# Patient Record
Sex: Female | Born: 1970 | Race: White | Hispanic: No | Marital: Single | Smoking: Former smoker
Health system: Southern US, Community
[De-identification: ages and names within clinical notes are randomized; demographics above are authoritative.]

## PROBLEM LIST (undated history)

## (undated) DIAGNOSIS — K219 Gastro-esophageal reflux disease without esophagitis: Secondary | ICD-10-CM

## (undated) DIAGNOSIS — M719 Bursopathy, unspecified: Secondary | ICD-10-CM

## (undated) DIAGNOSIS — K5900 Constipation, unspecified: Secondary | ICD-10-CM

## (undated) DIAGNOSIS — R51 Headache: Secondary | ICD-10-CM

## (undated) DIAGNOSIS — I10 Essential (primary) hypertension: Secondary | ICD-10-CM

## (undated) DIAGNOSIS — R519 Headache, unspecified: Secondary | ICD-10-CM

## (undated) DIAGNOSIS — J189 Pneumonia, unspecified organism: Secondary | ICD-10-CM

## (undated) DIAGNOSIS — Z87442 Personal history of urinary calculi: Secondary | ICD-10-CM

## (undated) DIAGNOSIS — F32A Depression, unspecified: Secondary | ICD-10-CM

## (undated) DIAGNOSIS — F419 Anxiety disorder, unspecified: Secondary | ICD-10-CM

## (undated) DIAGNOSIS — G4730 Sleep apnea, unspecified: Secondary | ICD-10-CM

## (undated) DIAGNOSIS — E669 Obesity, unspecified: Secondary | ICD-10-CM

## (undated) DIAGNOSIS — R112 Nausea with vomiting, unspecified: Secondary | ICD-10-CM

## (undated) DIAGNOSIS — R7303 Prediabetes: Secondary | ICD-10-CM

## (undated) DIAGNOSIS — N189 Chronic kidney disease, unspecified: Secondary | ICD-10-CM

## (undated) DIAGNOSIS — Z5189 Encounter for other specified aftercare: Secondary | ICD-10-CM

## (undated) DIAGNOSIS — R350 Frequency of micturition: Secondary | ICD-10-CM

## (undated) DIAGNOSIS — K649 Unspecified hemorrhoids: Secondary | ICD-10-CM

## (undated) DIAGNOSIS — Z9889 Other specified postprocedural states: Secondary | ICD-10-CM

## (undated) DIAGNOSIS — F329 Major depressive disorder, single episode, unspecified: Secondary | ICD-10-CM

## (undated) DIAGNOSIS — D649 Anemia, unspecified: Secondary | ICD-10-CM

## (undated) DIAGNOSIS — M1990 Unspecified osteoarthritis, unspecified site: Secondary | ICD-10-CM

## (undated) HISTORY — PX: OTHER SURGICAL HISTORY: SHX169

## (undated) HISTORY — DX: Obesity, unspecified: E66.9

## (undated) HISTORY — DX: Anemia, unspecified: D64.9

## (undated) HISTORY — PX: GASTRIC BYPASS: SHX52

## (undated) HISTORY — PX: WISDOM TOOTH EXTRACTION: SHX21

## (undated) HISTORY — DX: Sleep apnea, unspecified: G47.30

## (undated) HISTORY — PX: ABLATION: SHX5711

## (undated) HISTORY — DX: Essential (primary) hypertension: I10

## (undated) HISTORY — DX: Encounter for other specified aftercare: Z51.89

---
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
## 1998-08-05 ENCOUNTER — Encounter: Payer: Self-pay | Admitting: Obstetrics and Gynecology

## 1998-08-05 ENCOUNTER — Ambulatory Visit (HOSPITAL_COMMUNITY): Admission: RE | Admit: 1998-08-05 | Discharge: 1998-08-05 | Payer: Self-pay | Admitting: Obstetrics and Gynecology

## 1998-10-12 ENCOUNTER — Ambulatory Visit (HOSPITAL_COMMUNITY): Admission: RE | Admit: 1998-10-12 | Discharge: 1998-10-12 | Payer: Self-pay | Admitting: Obstetrics and Gynecology

## 1998-10-17 ENCOUNTER — Inpatient Hospital Stay (HOSPITAL_COMMUNITY): Admission: AD | Admit: 1998-10-17 | Discharge: 1998-10-17 | Payer: Self-pay | Admitting: Obstetrics and Gynecology

## 1998-10-21 ENCOUNTER — Ambulatory Visit (HOSPITAL_COMMUNITY): Admission: RE | Admit: 1998-10-21 | Discharge: 1998-10-21 | Payer: Self-pay | Admitting: Obstetrics and Gynecology

## 1998-10-21 ENCOUNTER — Encounter: Payer: Self-pay | Admitting: Obstetrics and Gynecology

## 1999-01-05 ENCOUNTER — Encounter (HOSPITAL_COMMUNITY): Admission: RE | Admit: 1999-01-05 | Discharge: 1999-01-16 | Payer: Self-pay | Admitting: Obstetrics and Gynecology

## 1999-01-13 ENCOUNTER — Inpatient Hospital Stay (HOSPITAL_COMMUNITY): Admission: AD | Admit: 1999-01-13 | Discharge: 1999-01-13 | Payer: Self-pay | Admitting: Obstetrics and Gynecology

## 1999-01-14 ENCOUNTER — Inpatient Hospital Stay (HOSPITAL_COMMUNITY): Admission: AD | Admit: 1999-01-14 | Discharge: 1999-01-16 | Payer: Self-pay | Admitting: Obstetrics and Gynecology

## 1999-01-17 ENCOUNTER — Encounter (HOSPITAL_COMMUNITY): Admission: RE | Admit: 1999-01-17 | Discharge: 1999-04-17 | Payer: Self-pay | Admitting: Obstetrics and Gynecology

## 2001-09-26 ENCOUNTER — Other Ambulatory Visit: Admission: RE | Admit: 2001-09-26 | Discharge: 2001-09-26 | Payer: Self-pay | Admitting: Obstetrics and Gynecology

## 2002-03-31 ENCOUNTER — Encounter: Payer: Self-pay | Admitting: *Deleted

## 2002-03-31 ENCOUNTER — Encounter: Admission: RE | Admit: 2002-03-31 | Discharge: 2002-03-31 | Payer: Self-pay | Admitting: *Deleted

## 2002-03-31 ENCOUNTER — Emergency Department (HOSPITAL_COMMUNITY): Admission: EM | Admit: 2002-03-31 | Discharge: 2002-04-01 | Payer: Self-pay | Admitting: *Deleted

## 2002-04-01 ENCOUNTER — Encounter: Payer: Self-pay | Admitting: *Deleted

## 2002-10-30 ENCOUNTER — Encounter: Payer: Self-pay | Admitting: Family Medicine

## 2002-10-30 ENCOUNTER — Encounter: Admission: RE | Admit: 2002-10-30 | Discharge: 2002-10-30 | Payer: Self-pay | Admitting: Family Medicine

## 2003-01-12 ENCOUNTER — Encounter: Admission: RE | Admit: 2003-01-12 | Discharge: 2003-03-02 | Payer: Self-pay | Admitting: Neurology

## 2003-02-05 ENCOUNTER — Other Ambulatory Visit: Admission: RE | Admit: 2003-02-05 | Discharge: 2003-02-05 | Payer: Self-pay | Admitting: Obstetrics and Gynecology

## 2004-12-29 ENCOUNTER — Encounter: Admission: RE | Admit: 2004-12-29 | Discharge: 2004-12-29 | Payer: Self-pay | Admitting: Family Medicine

## 2007-02-12 ENCOUNTER — Ambulatory Visit: Payer: Self-pay | Admitting: Pulmonary Disease

## 2007-06-11 DIAGNOSIS — J40 Bronchitis, not specified as acute or chronic: Secondary | ICD-10-CM | POA: Insufficient documentation

## 2007-06-11 DIAGNOSIS — K219 Gastro-esophageal reflux disease without esophagitis: Secondary | ICD-10-CM

## 2007-06-11 DIAGNOSIS — R918 Other nonspecific abnormal finding of lung field: Secondary | ICD-10-CM | POA: Insufficient documentation

## 2007-08-21 HISTORY — PX: TUBAL LIGATION: SHX77

## 2008-04-09 ENCOUNTER — Inpatient Hospital Stay (HOSPITAL_COMMUNITY): Admission: RE | Admit: 2008-04-09 | Discharge: 2008-04-11 | Payer: Self-pay | Admitting: Obstetrics and Gynecology

## 2008-04-09 ENCOUNTER — Encounter (INDEPENDENT_AMBULATORY_CARE_PROVIDER_SITE_OTHER): Payer: Self-pay | Admitting: Obstetrics and Gynecology

## 2008-04-16 ENCOUNTER — Inpatient Hospital Stay (HOSPITAL_COMMUNITY): Admission: AD | Admit: 2008-04-16 | Discharge: 2008-04-16 | Payer: Self-pay | Admitting: Obstetrics and Gynecology

## 2008-06-05 ENCOUNTER — Emergency Department (HOSPITAL_COMMUNITY): Admission: EM | Admit: 2008-06-05 | Discharge: 2008-06-05 | Payer: Self-pay | Admitting: Family Medicine

## 2009-12-15 ENCOUNTER — Emergency Department (HOSPITAL_COMMUNITY): Admission: EM | Admit: 2009-12-15 | Discharge: 2009-12-15 | Payer: Self-pay | Admitting: Family Medicine

## 2010-02-06 ENCOUNTER — Emergency Department (HOSPITAL_COMMUNITY): Admission: EM | Admit: 2010-02-06 | Discharge: 2010-02-06 | Payer: Self-pay | Admitting: Family Medicine

## 2010-02-06 ENCOUNTER — Emergency Department (HOSPITAL_COMMUNITY): Admission: EM | Admit: 2010-02-06 | Discharge: 2010-02-06 | Payer: Self-pay | Admitting: Emergency Medicine

## 2010-02-07 ENCOUNTER — Ambulatory Visit (HOSPITAL_COMMUNITY): Admission: RE | Admit: 2010-02-07 | Discharge: 2010-02-07 | Payer: Self-pay | Admitting: Emergency Medicine

## 2010-08-18 ENCOUNTER — Emergency Department (HOSPITAL_COMMUNITY)
Admission: EM | Admit: 2010-08-18 | Discharge: 2010-08-18 | Payer: Self-pay | Source: Home / Self Care | Admitting: Family Medicine

## 2010-09-10 ENCOUNTER — Encounter: Payer: Self-pay | Admitting: Family Medicine

## 2010-11-05 LAB — BASIC METABOLIC PANEL
GFR calc Af Amer: >60 mL/min (ref >60)
GFR calc non Af Amer: >60 mL/min (ref >60)
Glucose, Bld: 117 mg/dL — ABNORMAL HIGH (ref 70–99)
Sodium: 138 mEq/L (ref 135–145)

## 2010-11-05 LAB — DIFFERENTIAL
Basophils Absolute: 0 10*3/uL (ref 0.0–0.1)
Eosinophils Absolute: 0.1 10*3/uL (ref 0.0–0.7)
Lymphocytes Relative: 26 % (ref 12–46)
Lymphs Abs: 2.8 10*3/uL (ref 0.7–4.0)
Neutrophils Relative %: 67 % (ref 43–77)

## 2010-11-05 LAB — CBC
HCT: 41.6 % (ref 36.0–46.0)
Hemoglobin: 14.1 g/dL (ref 12.0–15.0)
MCV: 93.7 fL (ref 78.0–100.0)
Platelets: 225 10*3/uL (ref 150–400)
RBC: 4.45 MIL/uL (ref 3.87–5.11)
RDW: 14 % (ref 11.5–15.5)

## 2010-11-24 ENCOUNTER — Emergency Department (HOSPITAL_COMMUNITY): Payer: 59

## 2010-11-24 ENCOUNTER — Emergency Department (HOSPITAL_COMMUNITY)
Admission: EM | Admit: 2010-11-24 | Discharge: 2010-11-24 | Disposition: A | Discharge status: Home / Self Care | Payer: 59 | Attending: Emergency Medicine | Admitting: Emergency Medicine

## 2010-11-24 ENCOUNTER — Inpatient Hospital Stay (INDEPENDENT_AMBULATORY_CARE_PROVIDER_SITE_OTHER)
Admission: RE | Admit: 2010-11-24 | Discharge: 2010-11-24 | Disposition: A | Discharge status: Home / Self Care | Payer: 59 | Source: Ambulatory Visit | Attending: Family Medicine | Admitting: Family Medicine

## 2010-11-24 DIAGNOSIS — Z79899 Other long term (current) drug therapy: Secondary | ICD-10-CM | POA: Insufficient documentation

## 2010-11-24 DIAGNOSIS — R000 Tachycardia, unspecified: Secondary | ICD-10-CM | POA: Insufficient documentation

## 2010-11-24 DIAGNOSIS — M129 Arthropathy, unspecified: Secondary | ICD-10-CM | POA: Insufficient documentation

## 2010-11-24 DIAGNOSIS — R079 Chest pain, unspecified: Secondary | ICD-10-CM

## 2010-11-24 DIAGNOSIS — R002 Palpitations: Secondary | ICD-10-CM

## 2010-11-24 LAB — DIFFERENTIAL
Basophils Absolute: 0.1 10*3/uL (ref 0.0–0.1)
Basophils Relative: 0 % (ref 0–1)
Eosinophils Absolute: 0.3 10*3/uL (ref 0.0–0.7)
Lymphs Abs: 4.5 10*3/uL — ABNORMAL HIGH (ref 0.7–4.0)
Neutrophils Relative %: 54 % (ref 43–77)

## 2010-11-24 LAB — POCT I-STAT, CHEM 8
Calcium, Ion: 1.14 mmol/L (ref 1.12–1.32)
Creatinine, Ser: 0.9 mg/dL (ref 0.4–1.2)
HCT: 43 % (ref 36.0–46.0)
Hemoglobin: 14.6 g/dL (ref 12.0–15.0)
Sodium: 140 mEq/L (ref 135–145)

## 2010-11-24 LAB — CBC
HCT: 42.6 % (ref 36.0–46.0)
Hemoglobin: 14.5 g/dL (ref 12.0–15.0)
MCH: 31.4 pg (ref 26.0–34.0)
MCHC: 34 g/dL (ref 30.0–36.0)
Platelets: 237 10*3/uL (ref 150–400)

## 2010-11-24 LAB — POCT CARDIAC MARKERS: CKMB, poc: <1 ng/mL — ABNORMAL LOW (ref 1.0–8.0)

## 2011-01-02 NOTE — Discharge Summary (Signed)
NAME:  Emma Wagner, Emma Wagner NO.:  [id number]   MEDICAL RECORD NO.:  [id number]          PATIENT TYPE:  INP   LOCATION:  9148                          FACILITY:  [facility]   PHYSICIAN:  Zenaida Niece, M.D.DATE OF BIRTH:  05-08-1971   DATE OF ADMISSION:  04/09/2008  DATE OF DISCHARGE:  04/11/2008                               DISCHARGE SUMMARY   ADMISSION AND DISCHARGE DIAGNOSES:  1. Intrauterine pregnancy at 39 weeks.  2. Large for gestational age baby.  3. Prior shoulder dystocia.  4. Desired surgical sterility.   PROCEDURES:  On April 09, 2008, she had a primary low-transverse  cesarean section and bilateral partial salpingectomy.   HISTORY AND PHYSICAL:  Please see chart for full history and physical.  Briefly, this is a 40 year old female gravida 2, para 1-0-0-1 with an  EGA of 38 plus weeks who is admitted for primary cesarean section due to  the fact that she has a history in her prior delivery of a shoulder  dystocia with the baby weighed 9 pounds 2 ounces.  Recently, she has  measured size greater than dates and on April 02, 2008, had an  ultrasound with an estimated fetal weight of 3907 g, which was greater  than the 90th percentile with normal amniotic fluid volume.  Prenatal  care complicated by bilateral choroid plexus cysts on ultrasound, but  otherwise normal.   PAST OBSTETRICS HISTORY:  Significant for the above-mentioned cesarean  section.  The remainder of her history is in her history and physical.   PHYSICAL EXAMINATION:  Significant for a weight of 270 pounds gravid  abdomen with a fundal height of 41 cm and cervix is 1, thick and high.   HOSPITAL COURSE:  The patient was admitted on the day of surgery and  underwent a primary low-transverse cesarean section and bilateral  partial salpingectomy under spinal anesthesia.  She had normal anatomy  and delivered a viable female infant with Apgars of 9 and 9 that weighed  9 pounds 9 ounces.   Estimated blood loss was 800 mL.  Postpartum, she  had no significant complications.  Pre-delivery hemoglobin 12.1,  postdelivery 10.1.  On postoperative #2, she had remained afebrile and  requested discharge home.  Her incision appeared to be healing well, and  she was felt to be stable enough for discharge home.   DISCHARGE INSTRUCTIONS:  Regular diet, pelvic rest, and no strenuous  activity.   Follow up with [place] in 3-4 days for staple removal.   MEDICATIONS:  Percocet #40 one to two p.o. q.4-6 h. p.r.n. pain and over-  the-counter ibuprofen, and she is given our discharge pamphlet.      Zenaida Niece, M.D.  Electronically Signed     TDM/MEDQ  D:  04/11/2008  T:  04/11/2008  Job:  [id number]
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
## 2011-01-02 NOTE — Assessment & Plan Note (Signed)
[facility]                             PULMONARY OFFICE NOTE   NAME:Nanez, KALE DOLS                       MRN:          [id number]  DATE:02/12/2007                            DOB:          06/30/1971    REASON FOR CONSULTATION:  Bronchitis and COPD.   HISTORY OF PRESENT ILLNESS:  Emma Wagner is a pleasant 40 year old Caucasian  women who works at Clinical biochemist for [employer]. She  presented to [facility] on January 31, 2007 with URI  symptoms followed by wheezing, and dyspnea, and cough productive of  yellow sputum. She given antibiotics and Mucinex. Her peak flow was  recorded as 400 and improved with bronchodilators treatment to 480. A  chest x-ray reportedly showed some flapping of the diaphragm but no  infiltrates or effusions. Today she feels 80% better. She describes a  sensation of irritation in her chest with some reflexive clearing of her  throat which has been persistent for the past few months. She does not  have an acid taste in her mouth, but she does have persistent symptoms  of heartburn for which she constantly takes Tums. Currently she denies  dyspnea and nocturnal wheezing. There is no childhood history of asthma.  She smokes about a half pack a day for the last 15 years, but was able  to quit during her pregnancy, but resumed right after.   PAST MEDICAL HISTORY:  Includes, GERD.   PAST SURGICAL HISTORY:  Appendectomy at age 66.   ALLERGIES:  None known.   CURRENT MEDICATIONS:  1. Mucinex p.r.n.  2. Tums over-the-counter.   SOCIAL HISTORY:  Smoker as above, drinks alcohol socially. Lives with  her fiance and 17-year-old daughter.   FAMILY HISTORY:  An uncle has emphysema and maternal grandfather had  lung cancer.   REVIEW OF SYSTEMS:  Reports heartburn and a non productive cough.   PHYSICAL EXAMINATION:  Weight 242 pounds, afebrile, heart rate 87, blood  pressure 120/84, oxygen saturation 98% on room  air.  HEENT: Normal pharyngeal space. No postnasal drip.  NECK: Supple. No JVD. No lymphadenopathy.  CVS -s1 s2 normal.  CHEST: Clear to auscultation. No wheezing.  ABDOMEN: Soft, nontender.   LABORATORY DATA:  WBC count 13.1, hemoglobin 13.1, platelets 244 from  January 31, 2007.   IMPRESSION AND PLAN:  1. Acute bronchitis, this seems to have almost resolved and I do not      feel that further treatment is required.  2. Smoking cessation was discussed in great detail. I offered her a      spirometry to establish baseline pulmonary function and to motivate      her to quit. She wishes to defer this for now. Chantix was      discussed and prescribed including side effects and a quit date was      set for July the 4th. Weight loss was encouraged. She will follow      up with [place] as needed.     Oretha Milch, MD  Electronically Signed    RVA/MedQ  DD: 02/12/2007  DT: 02/12/2007  Job #: [id number]

## 2011-01-02 NOTE — Op Note (Signed)
NAME:  Emma Wagner, Emma Wagner NO.:  [id number]   MEDICAL RECORD NO.:  [id number]          PATIENT TYPE:  INP   LOCATION:  9148                          [facility]ILITY[facility]   PHYSICIAN:  Zenaida Niece, M.D.DATE OF BIRTH:  26-Apr-1971   DATE OF PROCEDURE:  DATE OF DISCHARGE:                               OPERATIVE REPORT   PREOPERATIVE DIAGNOSES:  Intrauterine pregnancy at 38+ weeks, large for  gestational age, prior shoulder dystocia, and desires surgical  sterility.   POSTOPERATIVE DIAGNOSES:  Intrauterine pregnancy at 38+ weeks, large for  gestational age, prior shoulder dystocia, and desires surgical  sterility.   PROCEDURE:  Primary low-transverse cesarean section and bilateral  partial salpingectomy.   SURGEON:  Zenaida Niece, MD.   ASSISTANTSherron Monday, MD.   ANESTHESIA:  Spinal.   FINDINGS:  The patient had normal gravid anatomy and delivered a viable  female infant with Apgars of 9 and 9, weight 9 pounds and 9 ounces.   SPECIMENS:  Placenta sent to Labor and Delivery.   ESTIMATED BLOOD LOSS:  800 mL.   COMPLICATIONS:  None.   PROCEDURE IN DETAIL:  The patient was taken to the operating room and  placed in the sitting position.  Dr. Tacy Dura instilled spinal anesthesia.  She was then placed in the dorsosupine position with left lateral tilt.  Abdomen was prepped and draped in the usual sterile fashion and a Foley  catheter was inserted.  The level of her anesthesia was found to be  adequate.  Abdomen was entered, via a standard Pfannenstiel incision.  The Alexis disposable self-retaining retractor was placed in the lower  uterine segment was well exposed.  A 4-cm transverse incision was made  in a fairly thick lower uterine segment.  Once the uterine cavity was  entered, the incision was extended digitally.  Fetal vertex was grasped  and delivered through the incision atraumatically.  Mouth and nares were  suctioned.  A loose nuchal cord x1 was  reduced.  The remainder of the  infant then delivered atraumatically.  The cord was doubly clamped and  cut, and the infant handed to the awaiting pediatric team.  Placenta was  delivered spontaneously after cord blood was collected.  Uterus was  wiped dry with a clean lap pad and all clots and debris removed.  The  uterine incision was inspected and found to be free of extensions.  Uterine incision was closed in one layer being a running locking layer  with #1 chromic.  Bleeding from the edges was controlled with  electrocautery and with 3-0 Vicryl.   Attention was turned to tubal ligation.  Both fallopian tubes were  identified and traced to their fimbriated ends.  A knuckle of tube on  each side was elevated with a Babcock clamp.  A window was made in an  avascular portion of the mesosalpinx.  A 0 plain gut suture was passed  through this window and tied proximally and wrapped around distally to  create a knuckle of tube.  The knuckle of tube was then removed sharply.  This was  done on both sides.  On both sides, both tubal ostia were  identified and the stumps were hemostatic.  Uterine incision was again  inspected and found to be hemostatic.  The retractor was removed.  Subfascial space was irrigated and made hemostatic with electrocautery.  The fascia was closed in a running fashion starting at both ends and  meeting in the middle with 0 Vicryl.  Subcutaneous tissue was irrigated  and made hemostatic with electrocautery.  Subcutaneous tissue was closed  with running 2-0 plain gut suture.  Skin was then closed with staples  followed by a sterile dressing.  The patient tolerated the procedure  well.  She was taken to the recovery room in stable condition.  Counts  were correct x2.  She received Ancef 2 g IV prior to the procedure and  had PAS hose throughout the procedure.      Zenaida Niece, M.D.  Electronically Signed     TDM/MEDQ  D:  04/09/2008  T:  04/10/2008  Job:   [id number]

## 2011-07-02 ENCOUNTER — Emergency Department (INDEPENDENT_AMBULATORY_CARE_PROVIDER_SITE_OTHER)
Admission: EM | Admit: 2011-07-02 | Discharge: 2011-07-02 | Disposition: A | Discharge status: Home / Self Care | Payer: 59 | Source: Home / Self Care | Attending: Emergency Medicine | Admitting: Emergency Medicine

## 2011-07-02 ENCOUNTER — Encounter (HOSPITAL_COMMUNITY): Payer: Self-pay

## 2011-07-02 DIAGNOSIS — J019 Acute sinusitis, unspecified: Secondary | ICD-10-CM

## 2011-07-02 DIAGNOSIS — J069 Acute upper respiratory infection, unspecified: Secondary | ICD-10-CM

## 2011-07-02 HISTORY — DX: Bursopathy, unspecified: M71.9

## 2011-07-02 MED ORDER — FEXOFENADINE-PSEUDOEPHED ER 60-120 MG PO TB12: 1.0000 | ORAL_TABLET | Freq: Two times a day (BID) | ORAL | Status: DC

## 2011-07-02 MED ORDER — PREDNISONE 20 MG PO TABS: 20.0000 mg | ORAL_TABLET | Freq: Every day | ORAL | Status: AC

## 2011-07-02 MED ORDER — AMOXICILLIN-POT CLAVULANATE 875-125 MG PO TABS: 1.0000 | ORAL_TABLET | Freq: Two times a day (BID) | ORAL | Status: AC

## 2011-07-02 NOTE — ED Notes (Signed)
C/o cold/uri symptoms, sinus pressure.  Denies fevers.

## 2011-07-02 NOTE — ED Provider Notes (Addendum)
History     CSN: [id number] Arrival date & time: 07/02/2011 10:21 AM   First MD Initiated Contact with Patient 07/02/11 1003      No chief complaint on file.   (Consider location/radiation/quality/duration/timing/severity/associated sxs/prior treatment) HPI Comments: Sinus congestion with cough and runny nose some post-nasal dripping. No fevers No sob  The history is provided by the patient.    No past medical history on file.  No past surgical history on file.  No family history on file.  History  Substance Use Topics  . Smoking status: Not on file  . Smokeless tobacco: Not on file  . Alcohol Use: Not on file    OB History    No data available      Review of Systems  Constitutional: Negative for fever, appetite change and fatigue.  HENT: Positive for congestion, rhinorrhea, postnasal drip and sinus pressure. Negative for ear pain and dental problem.   Respiratory: Negative for cough, chest tightness, shortness of breath and wheezing.   Cardiovascular: Negative for chest pain.    Allergies  Review of patient's allergies indicates not on file.  Home Medications  No current outpatient prescriptions on file.  BP 122/90  Pulse 100  Temp(Src) 98.9 F (37.2 C) (Oral)  Resp 18  SpO2 96%  Physical Exam  Nursing note and vitals reviewed. Constitutional: She appears well-developed and well-nourished.  HENT:  Head: Normocephalic.  Right Ear: Tympanic membrane normal.  Left Ear: Tympanic membrane normal.  Eyes: Conjunctivae are normal.  Neck: Trachea normal. Neck supple. No tracheal tenderness present.  Pulmonary/Chest: Effort normal and breath sounds normal.  Neurological: She is alert.  Skin: No rash noted.    ED Course  Procedures (including critical care time)  Labs Reviewed - No data to display No results found.   No diagnosis found.    MDM  Sinusitis        Jimmie Molly, MD 07/02/11 2130  Jimmie Molly, MD 07/02/11 2132

## 2011-07-27 ENCOUNTER — Emergency Department (INDEPENDENT_AMBULATORY_CARE_PROVIDER_SITE_OTHER): Payer: 59

## 2011-07-27 ENCOUNTER — Encounter (HOSPITAL_COMMUNITY): Payer: Self-pay | Admitting: Cardiology

## 2011-07-27 ENCOUNTER — Emergency Department (INDEPENDENT_AMBULATORY_CARE_PROVIDER_SITE_OTHER)
Admission: EM | Admit: 2011-07-27 | Discharge: 2011-07-27 | Disposition: A | Discharge status: Home / Self Care | Payer: 59 | Source: Home / Self Care | Attending: Family Medicine | Admitting: Family Medicine

## 2011-07-27 ENCOUNTER — Telehealth (HOSPITAL_COMMUNITY): Payer: Self-pay | Admitting: *Deleted

## 2011-07-27 DIAGNOSIS — F17200 Nicotine dependence, unspecified, uncomplicated: Secondary | ICD-10-CM

## 2011-07-27 DIAGNOSIS — J189 Pneumonia, unspecified organism: Secondary | ICD-10-CM

## 2011-07-27 MED ORDER — MOXIFLOXACIN HCL 400 MG PO TABS: 400.0000 mg | ORAL_TABLET | Freq: Every day | ORAL | Status: AC

## 2011-07-27 NOTE — ED Provider Notes (Signed)
History     CSN: [id number] Arrival date & time: 07/27/2011  8:54 AM   First MD Initiated Contact with Patient 07/27/11 [phone]      Chief Complaint  Patient presents with  . Generalized Body Aches  . Chills  . Cough    (Consider location/radiation/quality/duration/timing/severity/associated sxs/prior treatment) Patient is a 40 y.o. female presenting with cough. The history is provided by the patient.  Cough This is a recurrent problem. The current episode started 2 days ago (seen 11/12 and never really cleared, pt smokes.). The problem has been gradually worsening. The cough is productive of purulent sputum. There has been no fever. Associated symptoms include chills, rhinorrhea, myalgias and wheezing. Pertinent negatives include no sore throat. She is a smoker.    Past Medical History  Diagnosis Date  . Bursitis     bilateral hips, more on right hip    Past Surgical History  Procedure Date  . Appendectomy   . Tubal ligation 2009    Family History  Problem Relation Age of Onset  . Heart failure Other   . Hypertension Other   . Cancer Other     History  Substance Use Topics  . Smoking status: Current Everyday Smoker -- 1.0 packs/day  . Smokeless tobacco: Never Used  . Alcohol Use: Yes     occational    OB History    Grav Para Term Preterm Abortions TAB SAB Ect Mult Living                  Review of Systems  Constitutional: Positive for chills. Negative for fever.  HENT: Positive for congestion, rhinorrhea and postnasal drip. Negative for sore throat.   Respiratory: Positive for cough and wheezing.   Gastrointestinal: Negative.   Musculoskeletal: Positive for myalgias.  Skin: Negative.     Allergies  Review of patient's allergies indicates no known allergies.  Home Medications   Current Outpatient Rx  Name Route Sig Dispense Refill  . FEXOFENADINE-PSEUDOEPHED ER 60-120 MG PO TB12 Oral Take 1 tablet by mouth 2 (two) times daily. 30 tablet 0  .  OMEPRAZOLE 10 MG PO CPDR Oral Take 10 mg by mouth daily.      Marland Kitchen OVER THE COUNTER MEDICATION  [facility] chest congestion relief with D     . MOXIFLOXACIN HCL 400 MG PO TABS Oral Take 1 tablet (400 mg total) by mouth daily. 10 tablet 0    BP 123/74  Pulse 99  Temp(Src) 98.9 F (37.2 C) (Oral)  Resp 16  SpO2 99%  LMP 07/27/2011  Physical Exam  Constitutional: She appears well-developed and well-nourished.  HENT:  Head: Normocephalic.  Right Ear: External ear normal.  Left Ear: External ear normal.  Mouth/Throat: Oropharynx is clear and moist.  Eyes: Conjunctivae and EOM are normal. Pupils are equal, round, and reactive to light.  Neck: Normal range of motion. Neck supple.  Cardiovascular: Normal rate, normal heart sounds and intact distal pulses.   Pulmonary/Chest: Effort normal and breath sounds normal. She has no wheezes. She has no rales.  Abdominal: Soft. Bowel sounds are normal.  Lymphadenopathy:    She has no cervical adenopathy.  Skin: Skin is warm and dry.    ED Course  Procedures (including critical care time)  Labs Reviewed - No data to display Dg Chest 2 View  07/27/2011  *RADIOLOGY REPORT*  Clinical Data: Cough, congestion and smoker.  CHEST - 2 VIEW  Comparison: None.  Findings: There is evidence of an acute infiltrate localizing  to the anterior right upper lobe.  Mild interstitial and bronchial prominence throughout the rest of the lungs bilaterally is likely consistent with chronic disease.  There is no significant hyperinflation.  Heart size is at the upper limits of normal.  IMPRESSION: Right upper lobe infiltrate.  Probable component of chronic lung disease.  Original Report Authenticated By: Reola Calkins, M.D.     1. Pneumonia, community acquired   2. Active smoker       MDM  X-rays reviewed and report per radiologist.         Barkley Bruns, MD 07/27/11 1018

## 2011-07-27 NOTE — ED Notes (Signed)
Pt reports chest congestion, body aches and chills for the past 2 days. Pt reports wheezing at night.

## 2011-08-01 ENCOUNTER — Telehealth: Payer: Self-pay | Admitting: Family Medicine

## 2011-08-01 NOTE — Telephone Encounter (Signed)
Ok for Friday.

## 2011-08-01 NOTE — Telephone Encounter (Signed)
New to establish

## 2011-08-01 NOTE — Telephone Encounter (Signed)
Pt noted to have an appt on Friday at 11:30 in the system

## 2011-08-02 ENCOUNTER — Ambulatory Visit: Payer: 59 | Admitting: Family Medicine

## 2011-08-03 ENCOUNTER — Ambulatory Visit (INDEPENDENT_AMBULATORY_CARE_PROVIDER_SITE_OTHER): Payer: 59 | Admitting: Family Medicine

## 2011-08-03 ENCOUNTER — Encounter: Payer: Self-pay | Admitting: Family Medicine

## 2011-08-03 DIAGNOSIS — Z720 Tobacco use: Secondary | ICD-10-CM | POA: Insufficient documentation

## 2011-08-03 DIAGNOSIS — J189 Pneumonia, unspecified organism: Secondary | ICD-10-CM | POA: Insufficient documentation

## 2011-08-03 DIAGNOSIS — F17200 Nicotine dependence, unspecified, uncomplicated: Secondary | ICD-10-CM

## 2011-08-03 DIAGNOSIS — R918 Other nonspecific abnormal finding of lung field: Secondary | ICD-10-CM

## 2011-08-03 MED ORDER — VARENICLINE TARTRATE 1 MG PO TABS: 1.0000 mg | ORAL_TABLET | Freq: Two times a day (BID) | ORAL | Status: AC

## 2011-08-03 MED ORDER — VARENICLINE TARTRATE 0.5 MG X 11 & 1 MG X 42 PO MISC: ORAL | Status: AC

## 2011-08-03 NOTE — Patient Instructions (Signed)
Schedule your complete physical in 1-2 months- don't eat before this so we can do labs Start the Chantix today Congrats on quitting smoking!  You can totally do this! Call with any questions or concerns Welcome!  We're glad to have you! Happy Holidays!

## 2011-08-03 NOTE — Progress Notes (Signed)
  Subjective:    Patient ID: Emma Wagner, female    DOB: Jun 12, 1971, 40 y.o.   MRN: [id number]  HPI New to establish.  No previous PCP.  1) PNA- dx'd 1 week ago at [facility].  RUL infiltrate.  Is completing course of Avelox.  Feeling much better.  Still w/ some cough but no fevers or SOB.  2) COPD- dx'd based on CXR at UC due to mild interstitial thickening.  Reports UC MD was hostile and would not explain xray other than to say she needs to quit smoking.  Denies hx of wheezing, SOB.  One previous episode of bronchitis.  Is not frequently ill.  3) Tobacco use- was 1 ppd smoker x26 yrs.  Had last cigarette 1 week ago when told she had COPD.  Is having a hard time w/ smoking cessation- has been using OTC patch w/ minimal results.  Has tried Chantix before and is interested again.  Had no side effects w/ Chantix.   Review of Systems For ROS see HPI     Objective:   Physical Exam  Constitutional: She appears well-developed and well-nourished. No distress.  HENT:  Head: Normocephalic and atraumatic.       TMs normal bilaterally Mild nasal congestion Throat w/out erythema, edema, or exudate  Eyes: Conjunctivae and EOM are normal. Pupils are equal, round, and reactive to light.  Neck: Normal range of motion. Neck supple.  Cardiovascular: Normal rate, regular rhythm, normal heart sounds and intact distal pulses.   No murmur heard. Pulmonary/Chest: Effort normal and breath sounds normal. No respiratory distress. She has no wheezes.       + hacking cough  Lymphadenopathy:    She has no cervical adenopathy.  Skin: Skin is warm and dry.  Psychiatric: She has a normal mood and affect. Her behavior is normal. Judgment and thought content normal.          Assessment & Plan:

## 2011-08-12 NOTE — Assessment & Plan Note (Signed)
Pt has not smoked since UC visit last week.  Struggling w/ cravings.  Has previously used Chantix and did well.  Would like to resume.  Scripts provided.

## 2011-08-12 NOTE — Assessment & Plan Note (Signed)
Giving pt dx of COPD w/out pulmonary fxn testing seems premature- especially given nonspecific CXR findings.  Applauded her recent tobacco cessation.  Reviewed importance of remaining smoke free.  Will follow closely.

## 2011-08-12 NOTE — Assessment & Plan Note (Signed)
dx'd on xray at [facility].  Completing course of abx.  Reports feeling much better.

## 2011-08-22 ENCOUNTER — Ambulatory Visit: Payer: 59 | Admitting: Internal Medicine

## 2011-08-28 ENCOUNTER — Telehealth: Payer: Self-pay | Admitting: Family Medicine

## 2011-08-28 NOTE — Telephone Encounter (Signed)
Patient states that she thought dr. Beverely Low wrote her an rx for 90 day supply of chantix but the rx is only for 30 day. Patient states that she is almost out. Prescription states "authorization for refills required". [facility] [address]

## 2011-08-28 NOTE — Telephone Encounter (Signed)
Last OV noted 08-03-11

## 2011-08-28 NOTE — Telephone Encounter (Signed)
Pt was sent 2 scripts.  1 for starting month pack and then 1 for continuing pack w/ 1 refill.  Med is only to be taken for 12 weeks.  Script should be on file at her pharmacy.

## 2011-08-29 NOTE — Telephone Encounter (Signed)
Spoke to pt and advised that the rx for secondary chantix was sent on 08-03-11 along with the starter pack for #60 with 1 refill, [facility] [address] and they advised pt picked up the first dosage at a different pharmacy and that they do have the  Secondary order and they will be ready for pick up, called pt back and she is aware that they will have rx ready. Pt satisfied

## 2011-09-17 ENCOUNTER — Encounter: Payer: Self-pay | Admitting: Family Medicine

## 2011-09-17 ENCOUNTER — Ambulatory Visit (INDEPENDENT_AMBULATORY_CARE_PROVIDER_SITE_OTHER)
Admission: RE | Admit: 2011-09-17 | Discharge: 2011-09-17 | Disposition: A | Discharge status: Home / Self Care | Payer: 59 | Source: Ambulatory Visit | Attending: Family Medicine | Admitting: Family Medicine

## 2011-09-17 ENCOUNTER — Ambulatory Visit (INDEPENDENT_AMBULATORY_CARE_PROVIDER_SITE_OTHER): Payer: 59 | Admitting: Family Medicine

## 2011-09-17 DIAGNOSIS — E669 Obesity, unspecified: Secondary | ICD-10-CM

## 2011-09-17 DIAGNOSIS — R059 Cough, unspecified: Secondary | ICD-10-CM

## 2011-09-17 DIAGNOSIS — E663 Overweight: Secondary | ICD-10-CM | POA: Insufficient documentation

## 2011-09-17 DIAGNOSIS — R05 Cough: Secondary | ICD-10-CM | POA: Insufficient documentation

## 2011-09-17 MED ORDER — GUAIFENESIN-CODEINE 100-10 MG/5ML PO SYRP: 10.0000 mL | ORAL_SOLUTION | Freq: Three times a day (TID) | ORAL | Status: AC | PRN

## 2011-09-17 NOTE — Patient Instructions (Signed)
Schedule your complete physical in 4-6 weeks (don't eat before this appt) Go to [address] to get your chest xray Use the cough syrup as needed- this will make you sleepy My guess is that this is RSV (virus) and will improve w/ time Try and get regular exercise Make healthy food choices Call with any questions or concerns Hang in there!

## 2011-09-17 NOTE — Progress Notes (Signed)
  Subjective:    Patient ID: Emma Wagner, female    DOB: Dec 28, 1970, 41 y.o.   MRN: [id number]  HPI Cough- pt was dx'd in Dec w/ PNA.  Reports sick 8-9x since Nov.  Will get well for 'a week or so and then get sick again'.  Has not been on abx since December.  Current sxs started 1 week ago.  No fevers.  Cough is productive.  No longer smoking.  + nasal congestion, PND.  No ear pain, N/V/D.  + sinus pressure.  + sick contacts- has 41 yr old at home, RSV present in daycare.  Obesity- pt would like to lose weight, wants to discuss starting med to assist.  Not currently exercising but plans to start.  Has quit smoking- doesn't want to gain more weight.   Review of Systems For ROS see HPI     Objective:   Physical Exam  Vitals reviewed. Constitutional: She appears well-developed and well-nourished. No distress.       obese  HENT:  Head: Normocephalic and atraumatic.       TMs normal bilaterally Mild nasal congestion Throat w/out erythema, edema, or exudate  Eyes: Conjunctivae and EOM are normal. Pupils are equal, round, and reactive to light.  Neck: Normal range of motion. Neck supple.  Cardiovascular: Normal rate, regular rhythm, normal heart sounds and intact distal pulses.   No murmur heard. Pulmonary/Chest: Effort normal and breath sounds normal. No respiratory distress. She has no wheezes.       + hacking cough  Lymphadenopathy:    She has no cervical adenopathy.  Neurological: She is alert.  Skin: Skin is warm and dry.  Psychiatric: She has a normal mood and affect. Her behavior is normal.          Assessment & Plan:

## 2011-09-17 NOTE — Assessment & Plan Note (Signed)
New.  Most likely viral.  No evidence of bacterial infxn on PE.  + RSV exposure.  Pt concerned about possible PNA recurrence and inability to get well.  Would like repeat CXR as this was recommended at time of PNA dx.  CXR ordered.  Cough meds provided.  Reviewed supportive care and red flags that should prompt return.  Pt expressed understanding and is in agreement w/ plan.

## 2011-09-17 NOTE — Assessment & Plan Note (Signed)
New.  Discussed that most important weight loss efforts are daily exercise and healthy food choices.  Will not start stimulant such as phentermine at this time.  Discussed possibly starting Alli, pt to consider this- will follow at future visits.

## 2011-09-18 ENCOUNTER — Telehealth: Payer: Self-pay | Admitting: *Deleted

## 2011-09-18 NOTE — Telephone Encounter (Signed)
As discussed at OV, she can try OTC Alli.  Rarely prescribe weight loss meds b/c of the side effects (anxiety, insomnia, HTN) like we talked about yesterday.

## 2011-09-18 NOTE — Telephone Encounter (Signed)
Pt wanted to set up a CPE, noted earliest appt available per fasting is for 10-29-11 explained to pt this would be 6 weeks out based on her visit yesterday, pt accepted appt for 10-29-11 at 10am and understood she needs to be fasting. Pt then asked if MD Beverely Low can start her on any weight loss medication now and she update with her when she comes in on 10-29-11, I advised I would speak to MD Tabori about the request and I would get back in touch with her, she stated I could leave a detailed message on cell number [phone]

## 2011-09-18 NOTE — Telephone Encounter (Signed)
Spoke to pt to advise results/instructions. Pt understood.  

## 2011-10-29 ENCOUNTER — Ambulatory Visit (INDEPENDENT_AMBULATORY_CARE_PROVIDER_SITE_OTHER): Payer: 59 | Admitting: Family Medicine

## 2011-10-29 ENCOUNTER — Encounter: Payer: Self-pay | Admitting: Family Medicine

## 2011-10-29 DIAGNOSIS — Z0000 Encounter for general adult medical examination without abnormal findings: Secondary | ICD-10-CM

## 2011-10-29 DIAGNOSIS — Z720 Tobacco use: Secondary | ICD-10-CM

## 2011-10-29 DIAGNOSIS — F17200 Nicotine dependence, unspecified, uncomplicated: Secondary | ICD-10-CM

## 2011-10-29 LAB — CBC WITH DIFFERENTIAL/PLATELET
Basophils Absolute: 0 10*3/uL (ref 0.0–0.1)
Eosinophils Absolute: 0.1 10*3/uL (ref 0.0–0.7)
HCT: 37.4 % (ref 36.0–46.0)
Lymphs Abs: 2.7 10*3/uL (ref 0.7–4.0)
MCHC: 33 g/dL (ref 30.0–36.0)
Monocytes Absolute: 0.5 10*3/uL (ref 0.1–1.0)

## 2011-10-29 LAB — HEPATIC FUNCTION PANEL
ALT: 19 U/L (ref 0–35)
Total Bilirubin: 0.1 mg/dL — ABNORMAL LOW (ref 0.3–1.2)

## 2011-10-29 LAB — LIPID PANEL
Total CHOL/HDL Ratio: 4
Triglycerides: 111 mg/dL (ref 0.0–149.0)
VLDL: 22.2 mg/dL (ref 0.0–40.0)

## 2011-10-29 LAB — TSH: TSH: 1.83 u[IU]/mL (ref 0.35–5.50)

## 2011-10-29 LAB — BASIC METABOLIC PANEL
BUN: 11 mg/dL (ref 6–23)
CO2: 28 mEq/L (ref 19–32)
Calcium: 8.5 mg/dL (ref 8.4–10.5)
GFR: 78.38 mL/min (ref >60.00)

## 2011-10-29 NOTE — Assessment & Plan Note (Signed)
Pt's PE WNL w/ exception of obesity.  Check labs.  UTD on GYN.  Anticipatory guidance provided.  

## 2011-10-29 NOTE — Patient Instructions (Signed)
CONGRATS!!!  I'm so proud of you for quitting smoking! We'll notify you of your lab results Dr Merlyn Lot [facility])- [phone] for carpal tunnel [facility] for ADD testing [phone] Call with any questions or concerns Happy Early Birthday!!!

## 2011-10-29 NOTE — Progress Notes (Signed)
  Subjective:    Patient ID: Emma Wagner, female    DOB: Oct 23, 1970, 41 y.o.   MRN: [id number]  HPI CPE- Has GYN ([facility]inger), UTD on pap and will get 1st mammo.  Tobacco use- QUIT!!!!  Has been smoke free x3 months.  Stopped Chantix due to sadness.   Review of Systems Patient reports no vision/ hearing changes, adenopathy,fever, weight change,  persistant/recurrent hoarseness , swallowing issues, chest pain, edema, persistant/recurrent cough, hemoptysis, dyspnea (rest/exertional/paroxysmal nocturnal), gastrointestinal bleeding (melena, rectal bleeding), abdominal pain, significant heartburn, bowel changes, GU symptoms (dysuria, hematuria, incontinence), Gyn symptoms (abnormal  bleeding, pain),  syncope, focal weakness, memory loss, skin/hair/nail changes, abnormal bruising or bleeding, anxiety, or depression.   + palpitations- occurs monthly, will last 2-3 days and then 'disappear' + carpal tunnel bilaterally- sxs will wake her at night, will have intermittent numbness    Objective:   Physical Exam General Appearance:    Alert, cooperative, no distress, appears stated age  Head:    Normocephalic, without obvious abnormality, atraumatic  Eyes:    PERRL, conjunctiva/corneas clear, EOM's intact, fundi    benign, both eyes  Ears:    Normal TM's and external ear canals, both ears  Nose:   Nares normal, septum midline, mucosa normal, no drainage    or sinus tenderness  Throat:   Lips, mucosa, and tongue normal; teeth and gums normal  Neck:   Supple, symmetrical, trachea midline, no adenopathy;    Thyroid: no enlargement/tenderness/nodules  Back:     Symmetric, no curvature, ROM normal, no CVA tenderness  Lungs:     Clear to auscultation bilaterally, respirations unlabored  Chest Wall:    No tenderness or deformity   Heart:    Regular rate and rhythm, S1 and S2 normal, no murmur, rub   or gallop  Breast Exam:    Deferred to GYN  Abdomen:     Soft, non-tender, bowel sounds active all four  quadrants,    no masses, no organomegaly  Genitalia:    Deferred to GYN  Rectal:    Extremities:   Extremities normal, atraumatic, no cyanosis or edema  Pulses:   2+ and symmetric all extremities  Skin:   Skin color, texture, turgor normal, no rashes or lesions  Lymph nodes:   Cervical, supraclavicular, and axillary nodes normal  Neurologic:   CNII-XII intact, normal strength, sensation and reflexes    throughout          Assessment & Plan:

## 2011-11-01 LAB — VITAMIN D 1,25 DIHYDROXY
Vitamin D 1, 25 (OH)2 Total: 49 pg/mL (ref 18–72)
Vitamin D2 1, 25 (OH)2: <8 pg/mL
Vitamin D3 1, 25 (OH)2: 49 pg/mL

## 2011-11-02 ENCOUNTER — Encounter: Payer: Self-pay | Admitting: *Deleted

## 2011-11-02 LAB — HEMOGLOBIN A1C: Hgb A1c MFr Bld: 6.3 % (ref 4.6–6.5)

## 2011-11-20 LAB — HM PAP SMEAR: HM Pap smear: NORMAL

## 2011-11-26 ENCOUNTER — Telehealth: Payer: Self-pay | Admitting: Family Medicine

## 2011-11-26 NOTE — Telephone Encounter (Signed)
Caller: Emma Wagner/Patient; PCP: Sheliah Hatch.; CB#: [phone]. Caller reports she has had sore throat for the past 3 weeks. Afebrile and no other sxs of illness. She has had some allergy sxs that are now resolving, but throat pain persists. Painful to swallow. Caller has taken Ibuprofen ATC for the past 3 weeks, with some relief, but pain always returns. Increased fatigue. LMP-now Per Sore Throat Protocol, Caller advised she should be seen for eval of sxs. Unable to come today, and can only come after 4pm on Tues. Appt scheduled with Dr Beverely Low on Tues 4/9 at 4pm. Caller no longer on line when RN returned with appt info. Called and left message re appt date/time and need to be seen as scheduled. Advised to callback prn.

## 2011-11-26 NOTE — Telephone Encounter (Signed)
Noted  

## 2011-11-27 ENCOUNTER — Ambulatory Visit (INDEPENDENT_AMBULATORY_CARE_PROVIDER_SITE_OTHER): Payer: 59 | Admitting: Family Medicine

## 2011-11-27 ENCOUNTER — Encounter: Payer: Self-pay | Admitting: Family Medicine

## 2011-11-27 VITALS — BP 122/79 | HR 81 | Temp 98.8°F | Ht 65.75 in | Wt 243.8 lb

## 2011-11-27 DIAGNOSIS — F329 Major depressive disorder, single episode, unspecified: Secondary | ICD-10-CM | POA: Insufficient documentation

## 2011-11-27 DIAGNOSIS — M7070 Other bursitis of hip, unspecified hip: Secondary | ICD-10-CM

## 2011-11-27 DIAGNOSIS — F341 Dysthymic disorder: Secondary | ICD-10-CM

## 2011-11-27 DIAGNOSIS — M76899 Other specified enthesopathies of unspecified lower limb, excluding foot: Secondary | ICD-10-CM

## 2011-11-27 DIAGNOSIS — R0982 Postnasal drip: Secondary | ICD-10-CM

## 2011-11-27 DIAGNOSIS — F419 Anxiety disorder, unspecified: Secondary | ICD-10-CM

## 2011-11-27 DIAGNOSIS — M1611 Unilateral primary osteoarthritis, right hip: Secondary | ICD-10-CM | POA: Insufficient documentation

## 2011-11-27 MED ORDER — NABUMETONE 500 MG PO TABS: 500.0000 mg | ORAL_TABLET | Freq: Two times a day (BID) | ORAL | Status: DC | PRN

## 2011-11-27 MED ORDER — GLUCOSE BLOOD VI STRP: ORAL_STRIP | Status: AC

## 2011-11-27 MED ORDER — ONETOUCH FINEPOINT LANCETS MISC
Status: DC
Start: 2011-11-27 — End: 2013-11-18

## 2011-11-27 MED ORDER — VENLAFAXINE HCL ER 37.5 MG PO CP24: 37.5000 mg | ORAL_CAPSULE | Freq: Every day | ORAL | Status: DC

## 2011-11-27 NOTE — Progress Notes (Signed)
  Subjective:    Patient ID: Emma Wagner, female    DOB: 1971-04-28, 41 y.o.   MRN: [id number]  HPI Sore throat- sxs started 3+ weeks ago.  No fevers.  No cough.  + PND.  Intermittent ear pain.  Denies sinus pain.  Intermittent HA since Saturday.  No nausea.  No known sick contacts.  Hip bursitis- hx of similar.  Has recently started exercising up to 90 minutes/day after finding out she was pre diabetic.  Has seen Dr Darrelyn Hillock previously.  Has taken nabumetone previously w/ good results.  Depression- pt tearful during visits.  Overwhelmed w/ recent illnesses.  Admits to increased anxiety and sadness.  + fatigue, poor sleep.   Review of Systems For ROS see HPI     Objective:   Physical Exam  Vitals reviewed. Constitutional: She is oriented to person, place, and time. She appears well-developed and well-nourished. No distress.  HENT:  Head: Normocephalic and atraumatic.  Right Ear: Tympanic membrane normal.  Left Ear: Tympanic membrane normal.  Nose: Mucosal edema and rhinorrhea present. Right sinus exhibits no maxillary sinus tenderness and no frontal sinus tenderness. Left sinus exhibits no maxillary sinus tenderness and no frontal sinus tenderness.  Mouth/Throat: Mucous membranes are normal. Posterior oropharyngeal erythema (w/ PND) present.  Eyes: Conjunctivae and EOM are normal. Pupils are equal, round, and reactive to light.  Neck: Normal range of motion. Neck supple.  Cardiovascular: Normal rate, regular rhythm and normal heart sounds.   Pulmonary/Chest: Effort normal and breath sounds normal. No respiratory distress. She has no wheezes. She has no rales.  Lymphadenopathy:    She has no cervical adenopathy.  Neurological: She is alert and oriented to person, place, and time.  Skin: Skin is warm and dry.  Psychiatric:       Tearful, anxious          Assessment & Plan:

## 2011-11-27 NOTE — Patient Instructions (Signed)
Follow up in 1 month to recheck mood Start the Effexor daily (in the AM) Use the Nabumetone as needed for hip pain Ice or heat as needed Ease back into exercise Test no more than 1x/day Keep up the good work on healthy diet and regular exercise Your sore throat is due to post nasal drip- start OTC Claritin or Zyrtec Drink plenty of fluids Call with any questions or concerns Hang in there!!!

## 2011-12-02 NOTE — Assessment & Plan Note (Signed)
Chronic problem, restart nabumetone for pain relief.

## 2011-12-02 NOTE — Assessment & Plan Note (Signed)
New.  Cause of pt's current sore throat.  Start Claritin or Zyrtec daily to improve sxs.

## 2011-12-02 NOTE — Assessment & Plan Note (Signed)
New.  Start Effexor daily.  Follow up in 1 month.

## 2011-12-26 ENCOUNTER — Other Ambulatory Visit: Payer: Self-pay | Admitting: Family Medicine

## 2011-12-26 NOTE — Telephone Encounter (Signed)
refill for Nabumetone 500MG  Tablet  Qty 60 Take one tablet by mouth 3-times daily as needed for pain Last filled 4.9.13  Last OV 5.15.13

## 2011-12-27 ENCOUNTER — Ambulatory Visit: Payer: 59 | Admitting: Family Medicine

## 2011-12-27 MED ORDER — NABUMETONE 500 MG PO TABS: 500.0000 mg | ORAL_TABLET | Freq: Two times a day (BID) | ORAL | Status: DC | PRN

## 2011-12-27 NOTE — Telephone Encounter (Signed)
Pt notes that she is taking the medication BID/PRN for the bursideous in her hip, pt wanted to know if more than one month supply can be called in for her, please advise last refill noted on 11-27-11 #60 no refill

## 2011-12-27 NOTE — Telephone Encounter (Signed)
.  left message to have patient return my call to clarify how many times a day she is taking the medication per noted fax as three times a day however prescription noted as 2 times daily

## 2011-12-28 NOTE — Telephone Encounter (Signed)
Noted medication was sent for pt via MD Tabori via escribe

## 2012-01-02 ENCOUNTER — Encounter: Payer: Self-pay | Admitting: Family Medicine

## 2012-01-02 ENCOUNTER — Ambulatory Visit (INDEPENDENT_AMBULATORY_CARE_PROVIDER_SITE_OTHER): Payer: 59 | Admitting: Family Medicine

## 2012-01-02 VITALS — BP 120/82 | HR 76 | Temp 98.7°F | Ht 65.75 in | Wt 237.0 lb

## 2012-01-02 DIAGNOSIS — F419 Anxiety disorder, unspecified: Secondary | ICD-10-CM

## 2012-01-02 DIAGNOSIS — M7070 Other bursitis of hip, unspecified hip: Secondary | ICD-10-CM

## 2012-01-02 DIAGNOSIS — M76899 Other specified enthesopathies of unspecified lower limb, excluding foot: Secondary | ICD-10-CM

## 2012-01-02 DIAGNOSIS — F341 Dysthymic disorder: Secondary | ICD-10-CM

## 2012-01-02 DIAGNOSIS — E669 Obesity, unspecified: Secondary | ICD-10-CM

## 2012-01-02 MED ORDER — PHENTERMINE HCL 37.5 MG PO CAPS: 37.5000 mg | ORAL_CAPSULE | ORAL | Status: DC

## 2012-01-02 MED ORDER — NABUMETONE 500 MG PO TABS: 500.0000 mg | ORAL_TABLET | Freq: Two times a day (BID) | ORAL | Status: AC | PRN

## 2012-01-02 NOTE — Assessment & Plan Note (Signed)
Chronic problem.  Refill provided. 

## 2012-01-02 NOTE — Assessment & Plan Note (Signed)
Improved since last visit.  No changes to meds.  Discussed that adding phentermine may worsen anxiety.  Pt aware.  Will continue to follow closely.

## 2012-01-02 NOTE — Progress Notes (Signed)
  Subjective:    Patient ID: Emma Wagner, female    DOB: [birth year], 41 y.o.   MRN: [id number]  HPI Depression/anxiety- feels like there has been some improvement since starting Effexor.  Less tearful but still frustrated and anxious.  Sleeping better.  Has started exercising more regularly.  Obesity- is again asking for weight loss meds.  Has lost 6 lbs since last visit.  Working out 45-60 minutes 5x/week.  Also walking for over an hr daily.  Plans on starting gym workouts 2x/day next week.  Very frustrated by this.     Review of Systems For ROS see HPI     Objective:   Physical Exam  Vitals reviewed. Constitutional: She is oriented to person, place, and time. She appears well-developed and well-nourished. No distress.  HENT:  Head: Normocephalic and atraumatic.  Eyes: Conjunctivae and EOM are normal. Pupils are equal, round, and reactive to light.  Neck: Normal range of motion. Neck supple. No thyromegaly present.  Cardiovascular: Normal rate, regular rhythm, normal heart sounds and intact distal pulses.   No murmur heard. Pulmonary/Chest: Effort normal and breath sounds normal. No respiratory distress.  Abdominal: Soft. She exhibits no distension. There is no tenderness.  Musculoskeletal: She exhibits no edema.  Lymphadenopathy:    She has no cervical adenopathy.  Neurological: She is alert and oriented to person, place, and time.  Skin: Skin is warm and dry.  Psychiatric: She has a normal mood and affect. Her behavior is normal.          Assessment & Plan:

## 2012-01-02 NOTE — Patient Instructions (Signed)
Follow up in 6 weeks for phentermine recheck and A1C Start the Phentermine daily- if it worsens your anxiety we have to stop! Keep up the good work!  You're doing it! Call with any questions or concerns Hang in there!

## 2012-01-02 NOTE — Assessment & Plan Note (Signed)
Chronic problem.  Pt is exercising- almost to excess.  Start phentermine to boost metabolism.  Will follow closely to monitor weight loss, BP, anxiety.  Reviewed supportive care and red flags that should prompt return.  Pt expressed understanding and is in agreement w/ plan.

## 2012-02-07 ENCOUNTER — Encounter: Payer: Self-pay | Admitting: Family Medicine

## 2012-02-07 ENCOUNTER — Ambulatory Visit (INDEPENDENT_AMBULATORY_CARE_PROVIDER_SITE_OTHER): Payer: 59 | Admitting: Family Medicine

## 2012-02-07 VITALS — BP 114/76 | HR 85 | Temp 98.6°F | Wt 213.2 lb

## 2012-02-07 DIAGNOSIS — K612 Anorectal abscess: Secondary | ICD-10-CM

## 2012-02-07 DIAGNOSIS — K611 Rectal abscess: Secondary | ICD-10-CM

## 2012-02-07 NOTE — Progress Notes (Signed)
  Subjective:    Patient ID: Emma Wagner, female    DOB: 1970-12-03, 41 y.o.   MRN: [id number]  HPI Pt here c/o abscess close to rectum x few days.  It is painful,  No bleeding.     Review of Systems    as above Objective:   Physical Exam  Constitutional: She is oriented to person, place, and time. She appears well-developed and well-nourished.  Genitourinary:       + perirectal abcess---  Tender to touch, no drainage                                             Neurological: She is alert and oriented to person, place, and time.  Psychiatric: She has a normal mood and affect. Her behavior is normal.          Assessment & Plan:

## 2012-02-07 NOTE — Assessment & Plan Note (Signed)
Refer to surgery for I &D

## 2012-02-08 ENCOUNTER — Ambulatory Visit (INDEPENDENT_AMBULATORY_CARE_PROVIDER_SITE_OTHER): Payer: 59 | Admitting: General Surgery

## 2012-02-08 ENCOUNTER — Encounter (INDEPENDENT_AMBULATORY_CARE_PROVIDER_SITE_OTHER): Payer: Self-pay | Admitting: General Surgery

## 2012-02-08 VITALS — BP 108/72 | HR 88 | Temp 98.3°F | Ht 66.0 in | Wt 209.5 lb

## 2012-02-08 DIAGNOSIS — K645 Perianal venous thrombosis: Secondary | ICD-10-CM | POA: Insufficient documentation

## 2012-02-08 NOTE — Patient Instructions (Addendum)
Sit in a tub very warm water once or twice a day. Clean the area well after bowel movements. Keep the area covered with gauze until it closes. This usually takes approximately one week. Continue stool softeners. [place] to avoid constipation. Please return if symptoms do not resolve.

## 2012-02-08 NOTE — Progress Notes (Signed)
Subjective:     Patient ID: Emma Wagner, female   DOB: January 20, 1971, 41 y.o.   MRN: [id number]  HPI   Review of Systems  Constitutional: Negative for fever, chills and unexpected weight change.  HENT: Negative for hearing loss, congestion, sore throat, trouble swallowing and voice change.   Eyes: Negative for visual disturbance.  Respiratory: Negative for cough and wheezing.   Cardiovascular: Negative for chest pain, palpitations and leg swelling.  Gastrointestinal: Positive for constipation and rectal pain. Negative for nausea, vomiting, abdominal pain, diarrhea, blood in stool, abdominal distention and anal bleeding.  Genitourinary: Negative for hematuria, vaginal bleeding and difficulty urinating.  Musculoskeletal: Negative for arthralgias.  Skin: Negative for rash and wound.  Neurological: Negative for seizures, syncope and headaches.  Hematological: Negative for adenopathy. Does not bruise/bleed easily.  Psychiatric/Behavioral: Negative for confusion.       Objective:   Physical Exam  Constitutional: She appears well-developed and well-nourished.  Eyes: Pupils are equal, round, and reactive to light.  Cardiovascular: Normal rate and normal heart sounds.   No murmur heard. Pulmonary/Chest: Effort normal and breath sounds normal. No respiratory distress. She has no wheezes. She has no rales.  Abdominal: Soft. She exhibits no distension. There is no tenderness.       Perianal exam reveals no evidence of infection There is a large thrombosed  Posteriorly.There are several skin  Full rectal exam was deferred due to  tense thrombosed external hemorrhoid.  Area was prepped in a sterile fashion. 1% lidocaine with epinephrine was injected. Incision was made. Spontaneous expulsion of large clot.  Further clots were cleaned out. Hemostasis obtained with pressure. Dressing applied.       Assessment:     Thrombosed external hemorrhoid    Plan:     Evacuated as above. Instructions  were given. Return p.r.n.

## 2012-02-13 ENCOUNTER — Ambulatory Visit: Payer: 59 | Admitting: Family Medicine

## 2012-02-20 ENCOUNTER — Encounter: Payer: Self-pay | Admitting: Family Medicine

## 2012-02-20 ENCOUNTER — Ambulatory Visit (INDEPENDENT_AMBULATORY_CARE_PROVIDER_SITE_OTHER): Payer: 59 | Admitting: Family Medicine

## 2012-02-20 ENCOUNTER — Ambulatory Visit: Payer: 59 | Admitting: Family Medicine

## 2012-02-20 VITALS — BP 127/82 | HR 93 | Temp 98.2°F | Ht 65.25 in | Wt 212.0 lb

## 2012-02-20 DIAGNOSIS — R7303 Prediabetes: Secondary | ICD-10-CM | POA: Insufficient documentation

## 2012-02-20 DIAGNOSIS — E669 Obesity, unspecified: Secondary | ICD-10-CM

## 2012-02-20 DIAGNOSIS — R7309 Other abnormal glucose: Secondary | ICD-10-CM

## 2012-02-20 LAB — BASIC METABOLIC PANEL
BUN: 10 mg/dL (ref 6–23)
CO2: 25 mEq/L (ref 19–32)
Chloride: 105 mEq/L (ref 96–112)
Creatinine, Ser: 0.8 mg/dL (ref 0.4–1.2)
Glucose, Bld: 91 mg/dL (ref 70–99)
Potassium: 3.9 mEq/L (ref 3.5–5.1)

## 2012-02-20 LAB — HEMOGLOBIN A1C: Hgb A1c MFr Bld: 5.8 % (ref 4.6–6.5)

## 2012-02-20 MED ORDER — PHENTERMINE HCL 37.5 MG PO CAPS: 37.5000 mg | ORAL_CAPSULE | ORAL | Status: DC

## 2012-02-20 NOTE — Progress Notes (Signed)
  Subjective:    Patient ID: Emma Wagner, female    DOB: 1971-04-06, 41 y.o.   MRN: [id number]  HPI Obesity- has lost 25 lbs since last visit.  Going to gym twice daily.  'i'm really having fun w/ it'.  Has made dramatic diet changes.  No side effects from phentermine- no anxiety, palpitations, insomnia when taking 1/2 tab in AM and 1/2 tab at lunch.  Was having palpitations w/ whole tab in AM.  Reports increased motivation to be active.  Weight loss is slowing down.  Goal is 150-165.  Would like to continue meds long enough to get to goal.   Review of Systems For ROS see HPI     Objective:   Physical Exam  Vitals reviewed. Constitutional: She is oriented to person, place, and time. She appears well-developed and well-nourished. No distress.  HENT:  Head: Normocephalic and atraumatic.  Eyes: Conjunctivae and EOM are normal. Pupils are equal, round, and reactive to light.  Neck: Normal range of motion. Neck supple. No thyromegaly present.  Cardiovascular: Normal rate, regular rhythm, normal heart sounds and intact distal pulses.   No murmur heard. Pulmonary/Chest: Effort normal and breath sounds normal. No respiratory distress.  Abdominal: Soft. She exhibits no distension. There is no tenderness.  Musculoskeletal: She exhibits no edema.  Lymphadenopathy:    She has no cervical adenopathy.  Neurological: She is alert and oriented to person, place, and time.  Skin: Skin is warm and dry.  Psychiatric: She has a normal mood and affect. Her behavior is normal.          Assessment & Plan:

## 2012-02-20 NOTE — Patient Instructions (Addendum)
Follow up in 3 months to recheck weight loss progress You look great!  Keep up the good work! We'll call you with your nutrition appt Call with any questions or concerns Happy 4th of July!!!

## 2012-02-24 NOTE — Assessment & Plan Note (Signed)
Pt is doing well on phentermine and would like to continue until she reaches goal weight.  Is exercising regularly, has made drastic dietary changes.  Applauded her efforts.  Will continue to follow.  Script given for refill.

## 2012-02-24 NOTE — Assessment & Plan Note (Signed)
New.  Discovered on recent labs.  Pt has made commitment to healthy diet and regular exercise.  Repeat labs and see if they have improved.

## 2012-02-25 ENCOUNTER — Encounter: Payer: Self-pay | Admitting: Family Medicine

## 2012-02-25 ENCOUNTER — Encounter: Payer: Self-pay | Admitting: *Deleted

## 2012-02-25 NOTE — Telephone Encounter (Signed)
Noted pt labs were mailed today and not sure as to why she did not receive via my chart per noted as active, sent reply to pt with MY Chart number to advise

## 2012-03-24 ENCOUNTER — Telehealth: Payer: Self-pay | Admitting: Family Medicine

## 2012-03-24 NOTE — Telephone Encounter (Signed)
Last OV 02-20-12 last refill 11-27-11 30 with 3 refills

## 2012-03-24 NOTE — Telephone Encounter (Signed)
Ok for #30, 6 refills 

## 2012-03-24 NOTE — Telephone Encounter (Signed)
Refill: Venlafaxine hcl er 37.5mg  cap. Take 1 capsule by mouth daily. Qty 30. Last fill 02-25-12

## 2012-03-25 MED ORDER — VENLAFAXINE HCL ER 37.5 MG PO CP24: 37.5000 mg | ORAL_CAPSULE | Freq: Every day | ORAL | Status: DC

## 2012-03-25 NOTE — Telephone Encounter (Signed)
rx sent to pharmacy by e-script  

## 2012-04-04 ENCOUNTER — Other Ambulatory Visit: Payer: Self-pay | Admitting: Family Medicine

## 2012-04-04 NOTE — Telephone Encounter (Signed)
I called the pharmacy and rx is on file from 03/25/12, sent in error

## 2012-05-01 ENCOUNTER — Other Ambulatory Visit: Payer: Self-pay | Admitting: Family Medicine

## 2012-05-01 ENCOUNTER — Encounter: Payer: Self-pay | Admitting: Family Medicine

## 2012-05-01 MED ORDER — PHENTERMINE-TOPIRAMATE ER 3.75-23 MG PO CP24: 1.0000 | ORAL_CAPSULE | Freq: Every day | ORAL | Status: DC

## 2012-05-01 MED ORDER — PHENTERMINE-TOPIRAMATE ER 7.5-46 MG PO CP24: 1.0000 | ORAL_CAPSULE | Freq: Every day | ORAL | Status: DC

## 2012-05-01 NOTE — Telephone Encounter (Signed)
Please advise 

## 2012-05-01 NOTE — Telephone Encounter (Signed)
Pt made aware via my chart and vm left to advise RX ready for pick up

## 2012-05-01 NOTE — Telephone Encounter (Signed)
Please note, pt called in to advise she will come in 6-8 weeks to see her success with new medication, pt aware pick up will be tomorrow, pt notes ok to advise via my chart when the Rx will be ready for pick up

## 2012-05-02 ENCOUNTER — Encounter: Payer: Self-pay | Admitting: Family Medicine

## 2012-05-02 ENCOUNTER — Ambulatory Visit (INDEPENDENT_AMBULATORY_CARE_PROVIDER_SITE_OTHER): Payer: 59 | Admitting: Family Medicine

## 2012-05-02 VITALS — BP 118/78 | HR 76 | Temp 98.6°F | Resp 15 | Ht 66.0 in | Wt 200.2 lb

## 2012-05-02 DIAGNOSIS — F341 Dysthymic disorder: Secondary | ICD-10-CM

## 2012-05-02 DIAGNOSIS — L659 Nonscarring hair loss, unspecified: Secondary | ICD-10-CM

## 2012-05-02 DIAGNOSIS — F419 Anxiety disorder, unspecified: Secondary | ICD-10-CM

## 2012-05-02 LAB — CBC WITH DIFFERENTIAL/PLATELET
Basophils Absolute: 0 10*3/uL (ref 0.0–0.1)
Eosinophils Relative: 1 % (ref 0–5)
Lymphocytes Relative: 36 % (ref 12–46)
MCV: 91.2 fL (ref 78.0–100.0)
Neutro Abs: 3.8 10*3/uL (ref 1.7–7.7)
Neutrophils Relative %: 56 % (ref 43–77)
Platelets: 256 10*3/uL (ref 150–400)
RDW: 13.2 % (ref 11.5–15.5)
WBC: 6.6 10*3/uL (ref 4.0–10.5)

## 2012-05-02 NOTE — Patient Instructions (Addendum)
We'll notify you of your lab results Continue your multivitamin daily Add Biotin supplement daily Decrease effexor to every other day x1 month and then decrease to every 3rd day x1 month, and then attempt to stop Call with any questions or concerns Hang in there!!

## 2012-05-02 NOTE — Progress Notes (Signed)
  Subjective:    Patient ID: Emma Wagner, female    DOB: [birth year], 41 y.o.   MRN: [id number]  HPI Hair loss- sxs started 1 month ago, is worsening.  Pt brought bag of hair from this AM after washing hair- reports it's falling out in clumps.  Has not colored hair in 4 months.  No hair loss elsewhere on body.  Hx of irregular periods, currently having period.  Pt reports recent weight gain despite working out and watching diet.  Denies cold intolerance.    Anxiety/depression- pt reports sxs are well controlled.  Would like to wean off meds.   Review of Systems For ROS see HPI     Objective:   Physical Exam  Vitals reviewed. Constitutional: She appears well-developed and well-nourished. No distress.  HENT:  Head: Normocephalic and atraumatic.       No obvious alopecia noted Only 1 strand of hair came out w/ 3 separate pull tests  Neck: Normal range of motion. Neck supple. No thyromegaly present.  Lymphadenopathy:    She has no cervical adenopathy.  Skin: Skin is warm and dry.  Psychiatric: Her behavior is normal. Judgment and thought content normal.       Anxious but otherwise normal          Assessment & Plan:

## 2012-05-03 LAB — BASIC METABOLIC PANEL
Chloride: 107 mEq/L (ref 96–112)
Creat: 0.84 mg/dL (ref 0.50–1.10)

## 2012-05-03 LAB — TSH: TSH: 1.505 u[IU]/mL (ref 0.350–4.500)

## 2012-05-04 ENCOUNTER — Encounter: Payer: Self-pay | Admitting: Family Medicine

## 2012-05-04 NOTE — Assessment & Plan Note (Signed)
Improved per pt report.  Reviewed how to wean of effexor.  Discussed possible side effects.  Will follow.

## 2012-05-04 NOTE — Assessment & Plan Note (Signed)
New.  Pt reports hair loss is excessive.  No noted areas of alopecia.  Only 1 strand came out w/ 3 separate pull tests.  Check thyroid.  Discussed telogenic shedding cycles, impact of stress or dietary changes.  Will follow closely.  Derm referral if pt doesn't note improvement.

## 2012-05-08 ENCOUNTER — Encounter: Payer: Self-pay | Admitting: Family Medicine

## 2012-05-08 NOTE — Telephone Encounter (Signed)
Please advise 

## 2012-05-08 NOTE — Telephone Encounter (Signed)
FYI

## 2012-05-22 ENCOUNTER — Ambulatory Visit: Payer: 59 | Admitting: Family Medicine

## 2012-07-10 ENCOUNTER — Ambulatory Visit: Payer: 59 | Admitting: Family Medicine

## 2012-07-14 ENCOUNTER — Ambulatory Visit (INDEPENDENT_AMBULATORY_CARE_PROVIDER_SITE_OTHER): Payer: 59 | Admitting: Family Medicine

## 2012-07-14 VITALS — BP 110/72 | HR 66 | Temp 98.4°F | Wt 201.2 lb

## 2012-07-14 DIAGNOSIS — R7303 Prediabetes: Secondary | ICD-10-CM

## 2012-07-14 DIAGNOSIS — Z23 Encounter for immunization: Secondary | ICD-10-CM

## 2012-07-14 DIAGNOSIS — R7309 Other abnormal glucose: Secondary | ICD-10-CM

## 2012-07-14 DIAGNOSIS — Z299 Encounter for prophylactic measures, unspecified: Secondary | ICD-10-CM

## 2012-07-14 MED ORDER — PHENTERMINE-TOPIRAMATE ER 15-92 MG PO CP24: 1.0000 | ORAL_CAPSULE | Freq: Every day | ORAL | Status: DC

## 2012-07-14 MED ORDER — PHENTERMINE-TOPIRAMATE ER 11.25-69 MG PO CP24: 1.0000 | ORAL_CAPSULE | Freq: Every day | ORAL | Status: DC

## 2012-07-14 NOTE — Progress Notes (Signed)
  Subjective:    Patient ID: Emma Wagner, female    DOB: 08-Jul-1971, 41 y.o.   MRN: [id number]  HPI Prediabetes- pt has lost 11 lbs since July.  Is working out regularly- often twice daily.  Doesn't feel med is working.  Would like to titrate to higher dose of Qsymia.  Denies palpitations, insomnia.  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  Very frustrated w/ current situation b/c she feels she should be losing more weight than she is.   Review of Systems For ROS see HPI     Objective:   Physical Exam  Constitutional: She is oriented to person, place, and time. She appears well-developed and well-nourished. No distress.  HENT:  Head: Normocephalic and atraumatic.  Eyes: Conjunctivae normal and EOM are normal. Pupils are equal, round, and reactive to light.  Neck: Normal range of motion. Neck supple. No thyromegaly present.  Cardiovascular: Normal rate, regular rhythm, normal heart sounds and intact distal pulses.   No murmur heard. Pulmonary/Chest: Effort normal and breath sounds normal. No respiratory distress.  Abdominal: Soft. She exhibits no distension. There is no tenderness.  Musculoskeletal: She exhibits no edema.  Lymphadenopathy:    She has no cervical adenopathy.  Neurological: She is alert and oriented to person, place, and time.  Skin: Skin is warm and dry.  Psychiatric: She has a normal mood and affect. Her behavior is normal.          Assessment & Plan:

## 2012-07-14 NOTE — Patient Instructions (Addendum)
Follow up in 3 months to recheck weight and labs Keep up the good work on diet and exercise Increase the Qsymia as discussed Consider stopping the Effexor on a Wednesday- this would likely make Saturday and Sunday the worst days We'll notify you of your lab results Happy Thanksgiving!!!

## 2012-07-15 LAB — BASIC METABOLIC PANEL
BUN: 14 mg/dL (ref 6–23)
Chloride: 106 mEq/L (ref 96–112)
Creatinine, Ser: 0.8 mg/dL (ref 0.4–1.2)
Glucose, Bld: 78 mg/dL (ref 70–99)

## 2012-07-15 LAB — TSH: TSH: 2.41 u[IU]/mL (ref 0.35–5.50)

## 2012-07-27 NOTE — Assessment & Plan Note (Signed)
Pt continues to lose weight although she doesn't feel like she's losing enough for the amount she is exercising.  Would like to increase dose of stimulant to see if she has additional effect.  Applauded her healthy lifestyle choices and commitment to diet and exercise.  Will continue to follow closely.

## 2012-08-22 ENCOUNTER — Encounter: Payer: Self-pay | Admitting: Family Medicine

## 2012-08-27 ENCOUNTER — Telehealth: Payer: Self-pay | Admitting: Family Medicine

## 2012-08-27 NOTE — Telephone Encounter (Signed)
Pt called in and is still trying to get a call in regards to her my chart email and she also pulled her neck muscle and would like to see if there is a med that can be called in for her. pls call pt she would like a verbal conversation.

## 2012-08-27 NOTE — Telephone Encounter (Signed)
Pt has nabumetone listed on med list- is this what she is asking for and does she have any remaining medicine?

## 2012-08-27 NOTE — Telephone Encounter (Signed)
Pt called again requesting muscle relaxer for her neck. [facility] [address] CB# [phone]

## 2012-08-27 NOTE — Telephone Encounter (Signed)
Please advise on RF request.   Last OV and labs:07-14-12.//AB/CMA

## 2012-08-28 ENCOUNTER — Encounter: Payer: Self-pay | Admitting: Family Medicine

## 2012-08-28 MED ORDER — CYCLOBENZAPRINE HCL 5 MG PO TABS: 5.0000 mg | ORAL_TABLET | Freq: Three times a day (TID) | ORAL | Status: DC | PRN

## 2012-08-28 NOTE — Telephone Encounter (Signed)
I called the patient and she stated she is having neck pain,she is having a hard time moving her neck. She is using Ice, heat and tylenol and massaging it with a massager. She says she takes Celebrex and she is afraid to take anything else. She wants to get a muscle relaxer for the discomfort. Per dr.Tabori ok to send in flexeril 5 mg 1 po tid prn #21 an dif no relief by Friday she will need to come in. The patient voiced understanding. [facility].     KP

## 2012-08-28 NOTE — Telephone Encounter (Signed)
Pt was taking care of by Warren Lacy, and new med sent to the pharmacy.  See note under e-mail.//AB/CMA

## 2012-09-25 ENCOUNTER — Telehealth: Payer: Self-pay | Admitting: Family Medicine

## 2012-09-25 NOTE — Telephone Encounter (Signed)
Refill: Phentermine 37.5mg  capsule. Take one capsule by mouth every morning. Last fill 04-21-12

## 2012-09-26 NOTE — Telephone Encounter (Signed)
Please advise on RF request.  Last OV:07-14-12-has an appt for 10-15-12.//AB/CMA

## 2012-09-28 NOTE — Telephone Encounter (Signed)
Pt sent emails previously indicating that this medication was not working for her.  Please ask if she would like this refilled

## 2012-09-29 MED ORDER — PHENTERMINE HCL 37.5 MG PO CAPS: 37.5000 mg | ORAL_CAPSULE | ORAL | Status: DC

## 2012-09-29 NOTE — Telephone Encounter (Signed)
Ok to restart phentermine 37.5mg  daily, #30, 2 refills.  needs to sign agreement and do UDS if not already done

## 2012-09-29 NOTE — Telephone Encounter (Signed)
Spoke with the pt and informed her rx request was okayed and we will need for her to pick-up the rx this time.  Will need for her to update her control substance contract.  Pt understood and agreed.//AB/CMA

## 2012-09-29 NOTE — Telephone Encounter (Signed)
Spoke with the pt and informed her that she had sent an email previously indicating that the Phentermine was not working for her.   She stated that it was'nt so she was started on Qsymia 15mg , which was not working so she stopped it 1and1/21mos ago.   She had some Phentermine left so she started back taking it.  Asked her how long has she been back on it, and she stated 2 days.   Asked her if it was working, and she stated it was working so for.//AB/CMA

## 2012-10-15 ENCOUNTER — Ambulatory Visit: Payer: 59 | Admitting: Family Medicine

## 2012-11-25 ENCOUNTER — Encounter: Payer: Self-pay | Admitting: Family Medicine

## 2012-12-18 LAB — HM MAMMOGRAPHY: HM MAMMO: NORMAL

## 2013-02-26 ENCOUNTER — Encounter: Payer: Self-pay | Admitting: Family Medicine

## 2013-06-25 ENCOUNTER — Other Ambulatory Visit: Payer: Self-pay

## 2013-09-14 ENCOUNTER — Ambulatory Visit (INDEPENDENT_AMBULATORY_CARE_PROVIDER_SITE_OTHER): Payer: 59 | Admitting: Family Medicine

## 2013-09-14 ENCOUNTER — Encounter: Payer: Self-pay | Admitting: Family Medicine

## 2013-09-14 VITALS — BP 100/70 | HR 98 | Temp 98.7°F | Wt 209.0 lb

## 2013-09-14 DIAGNOSIS — R0982 Postnasal drip: Secondary | ICD-10-CM

## 2013-09-14 DIAGNOSIS — R059 Cough, unspecified: Secondary | ICD-10-CM

## 2013-09-14 DIAGNOSIS — R05 Cough: Secondary | ICD-10-CM

## 2013-09-14 DIAGNOSIS — J329 Chronic sinusitis, unspecified: Secondary | ICD-10-CM

## 2013-09-14 MED ORDER — FLUTICASONE PROPIONATE 50 MCG/ACT NA SUSP: 2.0000 | Freq: Every day | NASAL | Status: DC

## 2013-09-14 MED ORDER — ALBUTEROL SULFATE HFA 108 (90 BASE) MCG/ACT IN AERS: 2.0000 | INHALATION_SPRAY | Freq: Four times a day (QID) | RESPIRATORY_TRACT | Status: DC | PRN

## 2013-09-14 MED ORDER — DOXYCYCLINE HYCLATE 100 MG PO TABS: 100.0000 mg | ORAL_TABLET | Freq: Two times a day (BID) | ORAL | Status: DC

## 2013-09-14 NOTE — Progress Notes (Signed)
Chief Complaint  Patient presents with  . Cough    congestion, heaviness in chest, rattling noise in chest     HPI:  -started: on and off with sinus issues for 2 months, worse in last week with L max sinus pain -symptoms:nasal congestion, sore throat, cough, PND, sinus pressure -denies:fever, SOB, NVD, tooth pain, gerd, weight loss unintentional -has tried: nothing -sick contacts/travel/risks: denies flu exposure or ebola -reports no chance of pregnanacy  ROS: See pertinent positives and negatives per HPI.  Past Medical History  Diagnosis Date  . Bursitis     bilateral hips, more on right hip    Past Surgical History  Procedure Laterality Date  . Appendectomy    . Tubal ligation  2009    Family History  Problem Relation Age of Onset  . Heart failure Other   . Hypertension Other   . Cancer Other     History   Social History  . Marital Status: Single    Spouse Name: N/A    Number of Children: N/A  . Years of Education: N/A   Social History Main Topics  . Smoking status: Former Smoker -- 1.00 packs/day  . Smokeless tobacco: Never Used  . Alcohol Use: Yes     Comment: occational  . Drug Use: No  . Sexual Activity: Yes    Birth Control/ Protection: None   Other Topics Concern  . None   Social History Narrative  . None    Current outpatient prescriptions:B Complex-C-Folic Acid (MULTIVITAMIN, STRESS FORMULA) tablet, Take 1 tablet by mouth daily., Disp: , Rfl: ;  Casanthranol-Docusate Sodium 30-100 MG CAPS, Take 1 capsule by mouth., Disp: , Rfl: ;  celecoxib (CELEBREX) 200 MG capsule, Take 200 mg by mouth 2 (two) times daily., Disp: , Rfl:  cyclobenzaprine (FLEXERIL) 5 MG tablet, Take 1 tablet (5 mg total) by mouth 3 (three) times daily as needed for muscle spasms., Disp: 21 tablet, Rfl: 0;  LIFESCAN FINEPOINT LANCETS MISC, PT TESTS ONE TIME DAILY, Disp: 100 each, Rfl: 3;  omeprazole (PRILOSEC) 10 MG capsule, Take 10 mg by mouth daily.  , Disp: , Rfl: ;   Phentermine-Topiramate (QSYMIA) 11.25-69 MG CP24, Take 1 capsule by mouth daily., Disp: 14 capsule, Rfl: 0 Phentermine-Topiramate (QSYMIA) 15-92 MG CP24, Take 1 capsule by mouth daily., Disp: 30 capsule, Rfl: 1;  traMADol (ULTRAM) 50 MG tablet, Take 50 mg by mouth every 6 (six) hours as needed., Disp: , Rfl: ;  albuterol (PROVENTIL HFA;VENTOLIN HFA) 108 (90 BASE) MCG/ACT inhaler, Inhale 2 puffs into the lungs every 6 (six) hours as needed for wheezing or shortness of breath., Disp: 1 Inhaler, Rfl: 0 doxycycline (VIBRA-TABS) 100 MG tablet, Take 1 tablet (100 mg total) by mouth 2 (two) times daily., Disp: 20 tablet, Rfl: 0;  fluticasone (FLONASE) 50 MCG/ACT nasal spray, Place 2 sprays into both nostrils daily., Disp: 16 g, Rfl: 6;  phentermine 37.5 MG capsule, Take 1 capsule (37.5 mg total) by mouth every morning., Disp: 30 capsule, Rfl: 2 venlafaxine XR (EFFEXOR XR) 37.5 MG 24 hr capsule, Take 1 capsule (37.5 mg total) by mouth daily., Disp: 30 capsule, Rfl: 6  EXAM:  Filed Vitals:   09/14/13 1403  BP: 100/70  Pulse: 98  Temp: 98.7 F (37.1 C)    Body mass index is 33.75 kg/(m^2).  GENERAL: vitals reviewed and listed above, alert, oriented, appears well hydrated and in no acute distress  HEENT: atraumatic, conjunttiva clear, no obvious abnormalities on inspection of external nose and  ears, normal appearance of ear canals and TMs, clear nasal congestion, mild post oropharyngeal erythema with PND, no tonsillar edema or exudate, no sinus TTP  NECK: no obvious masses on inspection  LUNGS: clear to auscultation bilaterally, no wheezes, rales or rhonchi, good air movement  CV: HRRR, no peripheral edema  MS: moves all extremities without noticeable abnormality  PSYCH: pleasant and cooperative, no obvious depression or anxiety  ASSESSMENT AND PLAN:  Discussed the following assessment and plan:  PND (post-nasal drip) - Plan: fluticasone (FLONASE) 50 MCG/ACT nasal spray  Cough - Plan:  albuterol (PROVENTIL HFA;VENTOLIN HFA) 108 (90 BASE) MCG/ACT inhaler, fluticasone (FLONASE) 50 MCG/ACT nasal spray  Sinusitis - Plan: doxycycline (VIBRA-TABS) 100 MG tablet  -possible sinusitis, allergies, related bronchitis though suspect more upper airway cause -We discussed potential etiologies, with VURI being most likely, and advised supportive care and monitoring.  -We discussed treatment side effects, likely course, antibiotic misuse, transmission, and signs of developing a serious illness. -trial per orders, follow up 1 month -of course, we advised to return or notify a doctor immediately if symptoms worsen or persist or new concerns arise.    Patient Instructions  -As we discussed, we have prescribed a new medication for you at this appointment. We discussed the common and serious potential adverse effects of this medication and you can review these and more with the pharmacist when you pick up your medication.  Please follow the instructions for use carefully and notify us immediately if you have any problems taking this medication.  -flonase 2 sprays each nostril for 1 month  -follow up with your doctor in 1 month     KIM, [place]

## 2013-09-14 NOTE — Progress Notes (Signed)
Pre visit review using our clinic review tool, if applicable. No additional management support is needed unless otherwise documented below in the visit note. 

## 2013-09-14 NOTE — Patient Instructions (Signed)
-  As we discussed, we have prescribed a new medication for you at this appointment. We discussed the common and serious potential adverse effects of this medication and you can review these and more with the pharmacist when you pick up your medication.  Please follow the instructions for use carefully and notify us immediately if you have any problems taking this medication.  -flonase 2 sprays each nostril for 1 month  -follow up with your doctor in 1 month

## 2013-11-02 ENCOUNTER — Encounter: Payer: Self-pay | Admitting: Family Medicine

## 2013-11-18 ENCOUNTER — Telehealth: Payer: Self-pay

## 2013-11-18 NOTE — Telephone Encounter (Signed)
Medication and allergies:  Reviewed and updated  90 day supply/mail order: n/a Local pharmacy:  [id number] - [place], [place]   Immunizations due:  Td-DUE   A/P: No changes to personal, family history or past surgical hx PAP- "2 years ago" MMG- 01/01/13- negative Tdap- "unsure"; greater than 10 years ago   To Discuss with Provider: Patient would like to discuss weight loss, arm numbness, cough/chest congestion.  It was explained to the patient that the CPE is a wellness visit and that she's welcome to change visit to an office visit instead to address these issues. Patient stated that she would rather keep her physical and make office visit at a later date.

## 2013-11-19 ENCOUNTER — Encounter: Payer: Self-pay | Admitting: Family Medicine

## 2013-11-19 ENCOUNTER — Ambulatory Visit (INDEPENDENT_AMBULATORY_CARE_PROVIDER_SITE_OTHER): Payer: 59 | Admitting: Family Medicine

## 2013-11-19 VITALS — BP 110/74 | HR 76 | Temp 98.2°F | Resp 16 | Ht 65.75 in | Wt 210.2 lb

## 2013-11-19 DIAGNOSIS — Z0000 Encounter for general adult medical examination without abnormal findings: Secondary | ICD-10-CM

## 2013-11-19 LAB — BASIC METABOLIC PANEL
BUN: 11 mg/dL (ref 6–23)
CALCIUM: 8.6 mg/dL (ref 8.4–10.5)
CO2: 23 mEq/L (ref 19–32)
CREATININE: 0.7 mg/dL (ref 0.4–1.2)
Chloride: 103 mEq/L (ref 96–112)
GFR: 91.06 mL/min (ref >60.00)
GLUCOSE: 88 mg/dL (ref 70–99)
POTASSIUM: 3.4 meq/L — AB (ref 3.5–5.1)
Sodium: 134 mEq/L — ABNORMAL LOW (ref 135–145)

## 2013-11-19 LAB — CBC WITH DIFFERENTIAL/PLATELET
Basophils Absolute: 0.1 10*3/uL (ref 0.0–0.1)
Basophils Relative: 0.6 % (ref 0.0–3.0)
EOS ABS: 0.1 10*3/uL (ref 0.0–0.7)
Eosinophils Relative: 1 % (ref 0.0–5.0)
HCT: 37.7 % (ref 36.0–46.0)
HEMOGLOBIN: 12.5 g/dL (ref 12.0–15.0)
LYMPHS PCT: 28.4 % (ref 12.0–46.0)
Lymphs Abs: 2.6 10*3/uL (ref 0.7–4.0)
MCHC: 33.2 g/dL (ref 30.0–36.0)
MCV: 92.2 fl (ref 78.0–100.0)
MONOS PCT: 7 % (ref 3.0–12.0)
Monocytes Absolute: 0.6 10*3/uL (ref 0.1–1.0)
NEUTROS PCT: 63 % (ref 43.0–77.0)
Neutro Abs: 5.7 10*3/uL (ref 1.4–7.7)
Platelets: 262 10*3/uL (ref 150.0–400.0)
RBC: 4.09 Mil/uL (ref 3.87–5.11)
RDW: 13.4 % (ref 11.5–14.6)
WBC: 9 10*3/uL (ref 4.5–10.5)

## 2013-11-19 LAB — LIPID PANEL
Cholesterol: 149 mg/dL (ref 0–200)
HDL: 43.5 mg/dL (ref >39.00)
LDL Cholesterol: 85 mg/dL (ref 0–99)
TRIGLYCERIDES: 103 mg/dL (ref 0.0–149.0)
Total CHOL/HDL Ratio: 3
VLDL: 20.6 mg/dL (ref 0.0–40.0)

## 2013-11-19 LAB — HEPATIC FUNCTION PANEL
ALBUMIN: 3.8 g/dL (ref 3.5–5.2)
ALK PHOS: 38 U/L — AB (ref 39–117)
ALT: 20 U/L (ref 0–35)
AST: 15 U/L (ref 0–37)
Bilirubin, Direct: 0 mg/dL (ref 0.0–0.3)
TOTAL PROTEIN: 7.1 g/dL (ref 6.0–8.3)
Total Bilirubin: 0.6 mg/dL (ref 0.3–1.2)

## 2013-11-19 LAB — TSH: TSH: 1.48 u[IU]/mL (ref 0.35–5.50)

## 2013-11-19 LAB — HEMOGLOBIN A1C: Hgb A1c MFr Bld: 5.8 % (ref 4.6–6.5)

## 2013-11-19 MED ORDER — PHENTERMINE HCL 37.5 MG PO CAPS: 37.5000 mg | ORAL_CAPSULE | ORAL | Status: DC

## 2013-11-19 NOTE — Progress Notes (Signed)
   Subjective:    Patient ID: Emma Wagner, female    DOB: 06-01-1971, 43 y.o.   MRN: [id number]  HPI CPE- UTD on pap, mammo.  No concerns   Review of Systems Patient reports no vision/ hearing changes, adenopathy,fever, weight change,  persistant/recurrent hoarseness, swallowing issues, chest pain, palpitations, edema, hemoptysis, dyspnea (rest/exertional/paroxysmal nocturnal), gastrointestinal bleeding (melena, rectal bleeding), abdominal pain, bowel changes, GU symptoms (dysuria, hematuria, incontinence), Gyn symptoms (abnormal  bleeding, pain),  syncope, focal weakness, memory loss, skin/hair/nail changes, abnormal bruising or bleeding, anxiety, or depression.  + productive cough since Nov + GERD but this is improving, only requiring meds intermittently R arm numbness- positional     Objective:   Physical Exam General Appearance:    Alert, cooperative, no distress, appears stated age, obese  Head:    Normocephalic, without obvious abnormality, atraumatic  Eyes:    PERRL, conjunctiva/corneas clear, EOM's intact, fundi    benign, both eyes  Ears:    Normal TM's and external ear canals, both ears  Nose:   Nares normal, septum midline, mucosa normal, no drainage    or sinus tenderness  Throat:   Lips, mucosa, and tongue normal; teeth and gums normal  Neck:   Supple, symmetrical, trachea midline, no adenopathy;    Thyroid: no enlargement/tenderness/nodules  Back:     Symmetric, no curvature, ROM normal, no CVA tenderness  Lungs:     Clear to auscultation bilaterally, respirations unlabored  Chest Wall:    No tenderness or deformity   Heart:    Regular rate and rhythm, S1 and S2 normal, no murmur, rub   or gallop  Breast Exam:    Deferred to GYN  Abdomen:     Soft, non-tender, bowel sounds active all four quadrants,    no masses, no organomegaly  Genitalia:    Deferred to GYN  Rectal:    Extremities:   Extremities normal, atraumatic, no cyanosis or edema  Pulses:   2+ and  symmetric all extremities  Skin:   Skin color, texture, turgor normal, no rashes or lesions  Lymph nodes:   Cervical, supraclavicular, and axillary nodes normal  Neurologic:   CNII-XII intact, normal strength, sensation and reflexes    throughout          Assessment & Plan:

## 2013-11-19 NOTE — Progress Notes (Signed)
Pre visit review using our clinic review tool, if applicable. No additional management support is needed unless otherwise documented below in the visit note. 

## 2013-11-19 NOTE — Patient Instructions (Signed)
Follow up in 1 year or as needed We'll notify you of your lab results and make any changes if needed Start Claritin or Zyrtec daily for the seasonal allergies/post nasal drip Take the Phentermine x2 more months (total of 3) for weight loss- continue healthy diet and regular exercise Call with any questions or concerns Happy Spring!

## 2013-11-25 LAB — VITAMIN D 1,25 DIHYDROXY
VITAMIN D3 1, 25 (OH): 71 pg/mL
Vitamin D 1, 25 (OH)2 Total: 71 pg/mL (ref 18–72)
Vitamin D2 1, 25 (OH)2: <8 pg/mL

## 2013-11-29 NOTE — Assessment & Plan Note (Signed)
Pt's PE WNL.  UTD on pap/mammo.  Check labs.  Anticipatory guidance provided.

## 2014-02-19 ENCOUNTER — Emergency Department (HOSPITAL_COMMUNITY)
Admission: EM | Admit: 2014-02-19 | Discharge: 2014-02-19 | Disposition: A | Discharge status: Home / Self Care | Payer: 59 | Attending: Emergency Medicine | Admitting: Emergency Medicine

## 2014-02-19 ENCOUNTER — Emergency Department (HOSPITAL_COMMUNITY): Payer: 59

## 2014-02-19 ENCOUNTER — Encounter (HOSPITAL_COMMUNITY): Payer: Self-pay | Admitting: Emergency Medicine

## 2014-02-19 DIAGNOSIS — Z791 Long term (current) use of non-steroidal anti-inflammatories (NSAID): Secondary | ICD-10-CM | POA: Insufficient documentation

## 2014-02-19 DIAGNOSIS — N200 Calculus of kidney: Secondary | ICD-10-CM | POA: Insufficient documentation

## 2014-02-19 DIAGNOSIS — Z8739 Personal history of other diseases of the musculoskeletal system and connective tissue: Secondary | ICD-10-CM | POA: Insufficient documentation

## 2014-02-19 DIAGNOSIS — K5900 Constipation, unspecified: Secondary | ICD-10-CM | POA: Insufficient documentation

## 2014-02-19 DIAGNOSIS — Z79899 Other long term (current) drug therapy: Secondary | ICD-10-CM | POA: Insufficient documentation

## 2014-02-19 DIAGNOSIS — Z3202 Encounter for pregnancy test, result negative: Secondary | ICD-10-CM | POA: Insufficient documentation

## 2014-02-19 DIAGNOSIS — R310 Gross hematuria: Secondary | ICD-10-CM | POA: Insufficient documentation

## 2014-02-19 DIAGNOSIS — R112 Nausea with vomiting, unspecified: Secondary | ICD-10-CM | POA: Insufficient documentation

## 2014-02-19 DIAGNOSIS — Z87891 Personal history of nicotine dependence: Secondary | ICD-10-CM | POA: Insufficient documentation

## 2014-02-19 LAB — URINALYSIS, ROUTINE W REFLEX MICROSCOPIC
Bilirubin Urine: NEGATIVE
Glucose, UA: NEGATIVE mg/dL
Ketones, ur: NEGATIVE mg/dL
LEUKOCYTES UA: NEGATIVE
NITRITE: NEGATIVE
PROTEIN: 30 mg/dL — AB
Specific Gravity, Urine: 1.028 (ref 1.005–1.030)
Urobilinogen, UA: 0.2 mg/dL (ref 0.0–1.0)
pH: 7.5 (ref 5.0–8.0)

## 2014-02-19 LAB — COMPREHENSIVE METABOLIC PANEL
ALT: 10 U/L (ref 0–35)
ANION GAP: 12 (ref 5–15)
AST: 11 U/L (ref 0–37)
Albumin: 3.5 g/dL (ref 3.5–5.2)
Alkaline Phosphatase: 35 U/L — ABNORMAL LOW (ref 39–117)
BUN: 15 mg/dL (ref 6–23)
CALCIUM: 9.3 mg/dL (ref 8.4–10.5)
CO2: 22 mEq/L (ref 19–32)
CREATININE: 1 mg/dL (ref 0.50–1.10)
Chloride: 108 mEq/L (ref 96–112)
GFR calc non Af Amer: 68 mL/min — ABNORMAL LOW (ref >90)
GFR, EST AFRICAN AMERICAN: 79 mL/min — AB (ref >90)
GLUCOSE: 131 mg/dL — AB (ref 70–99)
Potassium: 4 mEq/L (ref 3.7–5.3)
Sodium: 142 mEq/L (ref 137–147)
TOTAL PROTEIN: 6.9 g/dL (ref 6.0–8.3)
Total Bilirubin: <0.2 mg/dL — ABNORMAL LOW (ref 0.3–1.2)

## 2014-02-19 LAB — LIPASE, BLOOD: LIPASE: 55 U/L (ref 11–59)

## 2014-02-19 LAB — CBC WITH DIFFERENTIAL/PLATELET
Basophils Absolute: 0 10*3/uL (ref 0.0–0.1)
Basophils Relative: 0 % (ref 0–1)
EOS ABS: 0.1 10*3/uL (ref 0.0–0.7)
EOS PCT: 1 % (ref 0–5)
HCT: 39.2 % (ref 36.0–46.0)
Hemoglobin: 12.8 g/dL (ref 12.0–15.0)
LYMPHS ABS: 2.3 10*3/uL (ref 0.7–4.0)
Lymphocytes Relative: 27 % (ref 12–46)
MCH: 31.1 pg (ref 26.0–34.0)
MCHC: 32.7 g/dL (ref 30.0–36.0)
MCV: 95.4 fL (ref 78.0–100.0)
MONO ABS: 0.5 10*3/uL (ref 0.1–1.0)
Monocytes Relative: 6 % (ref 3–12)
Neutro Abs: 5.4 10*3/uL (ref 1.7–7.7)
Neutrophils Relative %: 66 % (ref 43–77)
PLATELETS: 203 10*3/uL (ref 150–400)
RBC: 4.11 MIL/uL (ref 3.87–5.11)
RDW: 12.5 % (ref 11.5–15.5)
WBC: 8.2 10*3/uL (ref 4.0–10.5)

## 2014-02-19 LAB — POC URINE PREG, ED: PREG TEST UR: NEGATIVE

## 2014-02-19 LAB — URINE MICROSCOPIC-ADD ON

## 2014-02-19 MED ORDER — SODIUM CHLORIDE 0.9 % IV SOLN
INTRAVENOUS | Status: DC
Start: 2014-02-19 — End: 2014-02-19
  Administered 2014-02-19: 09:00:00 via INTRAVENOUS

## 2014-02-19 MED ORDER — OXYCODONE HCL 5 MG PO TABS: 5.0000 mg | ORAL_TABLET | Freq: Four times a day (QID) | ORAL | Status: DC | PRN

## 2014-02-19 MED ORDER — FENTANYL CITRATE 0.05 MG/ML IJ SOLN
100.0000 ug | Freq: Once | INTRAMUSCULAR | Status: AC
  Administered 2014-02-19: 100 ug via INTRAVENOUS
  Filled 2014-02-19: qty 2

## 2014-02-19 MED ORDER — ONDANSETRON 4 MG PO TBDP: 4.0000 mg | ORAL_TABLET | Freq: Three times a day (TID) | ORAL | Status: DC | PRN

## 2014-02-19 MED ORDER — FENTANYL CITRATE 0.05 MG/ML IJ SOLN: 50.0000 ug | Freq: Once | INTRAMUSCULAR | Status: DC

## 2014-02-19 MED ORDER — KETOROLAC TROMETHAMINE 30 MG/ML IJ SOLN
30.0000 mg | Freq: Once | INTRAMUSCULAR | Status: AC
  Administered 2014-02-19: 30 mg via INTRAVENOUS
  Filled 2014-02-19: qty 1

## 2014-02-19 MED ORDER — HYDROMORPHONE HCL PF 1 MG/ML IJ SOLN
0.5000 mg | Freq: Once | INTRAMUSCULAR | Status: AC
  Administered 2014-02-19: 0.5 mg via INTRAVENOUS
  Filled 2014-02-19: qty 1

## 2014-02-19 MED ORDER — TRAMADOL HCL 50 MG PO TABS: 50.0000 mg | ORAL_TABLET | Freq: Four times a day (QID) | ORAL | Status: DC | PRN

## 2014-02-19 MED ORDER — ONDANSETRON HCL 4 MG/2ML IJ SOLN
4.0000 mg | Freq: Once | INTRAMUSCULAR | Status: AC
  Administered 2014-02-19: 4 mg via INTRAVENOUS
  Filled 2014-02-19: qty 2

## 2014-02-19 MED ORDER — ONDANSETRON HCL 4 MG/2ML IJ SOLN
4.0000 mg | Freq: Once | INTRAMUSCULAR | Status: AC
Start: 2014-02-19 — End: 2014-02-19
  Administered 2014-02-19: 4 mg via INTRAVENOUS
  Filled 2014-02-19: qty 2

## 2014-02-19 MED ORDER — BISACODYL 5 MG PO TBEC: 5.0000 mg | DELAYED_RELEASE_TABLET | Freq: Two times a day (BID) | ORAL | Status: DC

## 2014-02-19 NOTE — ED Provider Notes (Signed)
CSN: [id number]     Arrival date & time 02/19/14  0757 History   First MD Initiated Contact with Patient 02/19/14 [phone]     Chief Complaint  Patient presents with  . Flank Pain     (Consider location/radiation/quality/duration/timing/severity/associated sxs/prior Treatment) HPI  Patient to the ER for evaluation and pain control of her right flank pain. It woke her up acutely out of sleep at 6 am this morning.She has had nausea and vomiting with it. She describes it as right inguinal, to RLQ then wrapping around her right flank. She has a history of appendectomy but still has her gall bladder. She does have a history of constipation but took a laxative a few days ago which she reports cleaned her out. She had an episode of something similar approx a week ago but not as severe pain, says that when she would urinate she noticed some pink when she wiped. EMS did not give any meds prior to arrival. + nausea and vomiting when pain is at its worst  Past Medical History  Diagnosis Date  . Bursitis     bilateral hips, more on right hip   Past Surgical History  Procedure Laterality Date  . Appendectomy    . Tubal ligation  2009   Family History  Problem Relation Age of Onset  . Heart failure Other   . Hypertension Other   . Cancer Other    History  Substance Use Topics  . Smoking status: Former Smoker -- 1.00 packs/day  . Smokeless tobacco: Never Used  . Alcohol Use: Yes     Comment: occational   OB History   Grav Para Term Preterm Abortions TAB SAB Ect Mult Living                 Review of Systems   Review of Systems  Gen: no weight loss, fevers, chills, night sweats  Eyes: no discharge or drainage, no occular pain or visual changes  Nose: no epistaxis or rhinorrhea  Mouth: no dental pain, no sore throat  Neck: no neck pain  Lungs:No wheezing, coughing or hemoptysis CV: no chest pain, palpitations, dependent edema or orthopnea  Abd: no abdominal pain or diarrhea + nausea,  vomiting GU: no dysuria + gross hematuria and flank pain MSK:  No muscle weakness or pain Neuro: no headache, no focal neurologic deficits  Skin: no rash or wounds Psyche: no complaints    Allergies  Review of patient's allergies indicates no known allergies.  Home Medications   Prior to Admission medications   Medication Sig Start Date End Date Taking? Authorizing Provider  Biotin 5000 MCG TABS Take 1 tablet by mouth daily.   Yes Historical Provider, MD  Cyanocobalamin (VITAMIN B 12 PO) Take 2,500 mg by mouth daily.   Yes Historical Provider, MD  ibuprofen (ADVIL,MOTRIN) 800 MG tablet Take 800 mg by mouth every 8 (eight) hours as needed for fever, headache, mild pain, moderate pain or cramping.   Yes Historical Provider, MD  phentermine 37.5 MG capsule Take 1 capsule (37.5 mg total) by mouth every morning. 11/19/13 02/24/14 Yes Midge Minium, MD  senna (SENOKOT) 8.6 MG tablet Take 2 tablets by mouth daily.   Yes Historical Provider, MD  Wheat Dextrin (EQ FIBER POWDER PO) Take 1 each by mouth daily. 1 tablespoon   Yes Historical Provider, MD  bisacodyl (DULCOLAX) 5 MG EC tablet Take 1 tablet (5 mg total) by mouth 2 (two) times daily. 02/19/14   Linus Mako,  PA-C  ondansetron (ZOFRAN ODT) 4 MG disintegrating tablet Take 1 tablet (4 mg total) by mouth every 8 (eight) hours as needed for nausea or vomiting. 02/19/14   Mai Longnecker Marilu Favre, PA-C  oxyCODONE (ROXICODONE) 5 MG immediate release tablet Take 1 tablet (5 mg total) by mouth every 6 (six) hours as needed for severe pain. 02/19/14   Marialice Newkirk Marilu Favre, PA-C  traMADol (ULTRAM) 50 MG tablet Take 1 tablet (50 mg total) by mouth every 6 (six) hours as needed. 02/19/14   Briggs Edelen Marilu Favre, PA-C   BP 112/74  Pulse 58  Temp(Src) 98.1 F (36.7 C) (Oral)  Resp 14  SpO2 98% Physical Exam  Nursing note and vitals reviewed. Constitutional: She appears well-developed and well-nourished. No distress.  HENT:  Head: Normocephalic and atraumatic.   Eyes: Pupils are equal, round, and reactive to light.  Neck: Normal range of motion. Neck supple.  Cardiovascular: Normal rate and regular rhythm.   Pulmonary/Chest: Effort normal.  Abdominal: Soft. Bowel sounds are normal. She exhibits no distension and no fluid wave. There is tenderness. There is CVA tenderness (right flank). There is no guarding.  Neurological: She is alert.  Skin: Skin is warm and dry.    ED Course  Procedures (including critical care time) Labs Review Labs Reviewed  COMPREHENSIVE METABOLIC PANEL - Abnormal; Notable for the following:    Glucose, Bld 131 (*)    Alkaline Phosphatase 35 (*)    Total Bilirubin <0.2 (*)    GFR calc non Af Amer 68 (*)    GFR calc Af Amer 79 (*)    All other components within normal limits  URINALYSIS, ROUTINE W REFLEX MICROSCOPIC - Abnormal; Notable for the following:    APPearance CLOUDY (*)    Hgb urine dipstick LARGE (*)    Protein, ur 30 (*)    All other components within normal limits  CBC WITH DIFFERENTIAL  LIPASE, BLOOD  URINE MICROSCOPIC-ADD ON  POC URINE PREG, ED    Imaging Review Ct Abdomen Pelvis Wo Contrast  02/19/2014   CLINICAL DATA:  Right-sided flank pain.  Nausea and vomiting.  EXAM: CT ABDOMEN AND PELVIS WITHOUT CONTRAST  TECHNIQUE: Multidetector CT imaging of the abdomen and pelvis was performed following the standard protocol without IV contrast.  COMPARISON:  None.  FINDINGS: Lung bases are clear. No pleural or pericardial fluid. The liver is normal without contrast. The spleen is normal. The pancreas is normal. The right adrenal gland is normal. The left adrenal gland contains a 1 cm mass with negative Hounsfield units consistent with benign adenoma. The left kidney is normal. No cysts, mass, stone or hydronephrosis. The aorta and IVC are normal.  The right kidney is swollen and there is hydroureteronephrosis with the ureter being dilated to the pelvic brim where there is a 5 mm stone. No stone distal to that.  Bladder, uterus and adnexal regions appear normal. No free fluid. No bowel pathology. No significant bony finding.  IMPRESSION: Hydroureteronephrosis on the right because of a 5 mm stone in the ureter at the level of the pelvic brim. no second urinary tract stone is seen.   Electronically Signed   By: Nelson Chimes M.D.   On: 02/19/2014 11:13     EKG Interpretation None      MDM   Final diagnoses:  Kidney stone   Medications  0.9 %  sodium chloride infusion ( Intravenous New Bag/Given 02/19/14 0856)  ondansetron (ZOFRAN) injection 4 mg (4 mg Intravenous Given 02/19/14  0820)  fentaNYL (SUBLIMAZE) injection 100 mcg (100 mcg Intravenous Given 02/19/14 0820)  HYDROmorphone (DILAUDID) injection 0.5 mg (0.5 mg Intravenous Given 02/19/14 0848)  HYDROmorphone (DILAUDID) injection 0.5 mg (0.5 mg Intravenous Given 02/19/14 1021)  ketorolac (TORADOL) 30 MG/ML injection 30 mg (30 mg Intravenous Given 02/19/14 1143)  ondansetron (ZOFRAN) injection 4 mg (4 mg Intravenous Given 02/19/14 1143)    Pain was difficult to control in the ER but the Toradol worked best and she now has no pain. She has a 48mm stone in the ereter at the level of the pelvic brim. No other stones seen. Patient given a urine strainer and discussed f/u with the urologist. She has concerns about constipation generally at home and I have given her an rx for a stool softner and referral to GI.     bisacodyl (DULCOLAX) 5 MG EC tablet Take 1 tablet (5 mg total) by mouth 2 (two) times daily. 30 tablet Linus Mako, PA-C   ondansetron (ZOFRAN ODT) 4 MG disintegrating tablet Take 1 tablet (4 mg total) by mouth every 8 (eight) hours as needed for nausea or vomiting. 20 tablet Linus Mako, PA-C   oxyCODONE (ROXICODONE) 5 MG immediate release tablet Take 1 tablet (5 mg total) by mouth every 6 (six) hours as needed for severe pain. 12 tablet Linus Mako, PA-C   traMADol (ULTRAM) 50 MG tablet Take 1 tablet (50 mg total) by mouth every 6 (six)  hours as needed. 30 tablet Linus Mako, PA-C   43 y.o.Emma Wagner's evaluation in the Emergency Department is complete. It has been determined that no acute conditions requiring further emergency intervention are present at this time. The patient/guardian have been advised of the diagnosis and plan. We have discussed signs and symptoms that warrant return to the ED, such as changes or worsening in symptoms.  Vital signs are stable at discharge. Filed Vitals:   02/19/14 1200  BP: 112/74  Pulse: 58  Temp:   Resp: 14    Patient/guardian has voiced understanding and agreed to follow-up with the PCP or specialist.   Linus Mako, PA-C 02/19/14 1242

## 2014-02-19 NOTE — Discharge Instructions (Signed)

## 2014-02-19 NOTE — ED Notes (Signed)
Per EMS, pt has been experiencing right side flank pain since approximately 6 this morning, pt has also experienced some nausea and vomiting within the last hour. Pt has been unable to relieve the pain at home, pain 10/10. No meds given with EMS. NAD on arrival.

## 2014-02-20 NOTE — ED Provider Notes (Signed)
Medical screening examination/treatment/procedure(s) were performed by non-physician practitioner and as supervising physician I was immediately available for consultation/collaboration.    Dot Lanes, MD 02/20/14 [phone]

## 2014-04-11 ENCOUNTER — Emergency Department (HOSPITAL_COMMUNITY): Payer: 59

## 2014-04-11 ENCOUNTER — Emergency Department (HOSPITAL_COMMUNITY)
Admission: EM | Admit: 2014-04-11 | Discharge: 2014-04-11 | Disposition: A | Discharge status: Home / Self Care | Payer: 59 | Attending: Emergency Medicine | Admitting: Emergency Medicine

## 2014-04-11 ENCOUNTER — Encounter (HOSPITAL_COMMUNITY): Payer: Self-pay | Admitting: Emergency Medicine

## 2014-04-11 DIAGNOSIS — N23 Unspecified renal colic: Secondary | ICD-10-CM | POA: Insufficient documentation

## 2014-04-11 DIAGNOSIS — R109 Unspecified abdominal pain: Secondary | ICD-10-CM | POA: Insufficient documentation

## 2014-04-11 DIAGNOSIS — Z87442 Personal history of urinary calculi: Secondary | ICD-10-CM | POA: Diagnosis not present

## 2014-04-11 DIAGNOSIS — Z87891 Personal history of nicotine dependence: Secondary | ICD-10-CM | POA: Insufficient documentation

## 2014-04-11 DIAGNOSIS — Z3202 Encounter for pregnancy test, result negative: Secondary | ICD-10-CM | POA: Insufficient documentation

## 2014-04-11 DIAGNOSIS — Z79899 Other long term (current) drug therapy: Secondary | ICD-10-CM | POA: Insufficient documentation

## 2014-04-11 DIAGNOSIS — R319 Hematuria, unspecified: Secondary | ICD-10-CM

## 2014-04-11 DIAGNOSIS — Z8739 Personal history of other diseases of the musculoskeletal system and connective tissue: Secondary | ICD-10-CM | POA: Diagnosis not present

## 2014-04-11 LAB — CBC WITH DIFFERENTIAL/PLATELET
Basophils Absolute: 0 10*3/uL (ref 0.0–0.1)
Basophils Relative: 0 % (ref 0–1)
EOS ABS: 0.1 10*3/uL (ref 0.0–0.7)
EOS PCT: 0 % (ref 0–5)
HCT: 35.2 % — ABNORMAL LOW (ref 36.0–46.0)
Hemoglobin: 11.7 g/dL — ABNORMAL LOW (ref 12.0–15.0)
Lymphocytes Relative: 19 % (ref 12–46)
Lymphs Abs: 2.3 10*3/uL (ref 0.7–4.0)
MCH: 30.5 pg (ref 26.0–34.0)
MCHC: 33.2 g/dL (ref 30.0–36.0)
MCV: 91.9 fL (ref 78.0–100.0)
Monocytes Absolute: 0.4 10*3/uL (ref 0.1–1.0)
Monocytes Relative: 3 % (ref 3–12)
NEUTROS PCT: 78 % — AB (ref 43–77)
Neutro Abs: 9.5 10*3/uL — ABNORMAL HIGH (ref 1.7–7.7)
PLATELETS: 250 10*3/uL (ref 150–400)
RBC: 3.83 MIL/uL — ABNORMAL LOW (ref 3.87–5.11)
RDW: 12.4 % (ref 11.5–15.5)
WBC: 12.2 10*3/uL — ABNORMAL HIGH (ref 4.0–10.5)

## 2014-04-11 LAB — URINALYSIS, ROUTINE W REFLEX MICROSCOPIC
GLUCOSE, UA: 100 mg/dL — AB
KETONES UR: 40 mg/dL — AB
NITRITE: NEGATIVE
PROTEIN: 100 mg/dL — AB
Specific Gravity, Urine: 1.032 — ABNORMAL HIGH (ref 1.005–1.030)
UROBILINOGEN UA: 0.2 mg/dL (ref 0.0–1.0)
pH: 5.5 (ref 5.0–8.0)

## 2014-04-11 LAB — COMPREHENSIVE METABOLIC PANEL
ALT: 14 U/L (ref 0–35)
ANION GAP: 15 (ref 5–15)
AST: 17 U/L (ref 0–37)
Albumin: 3.9 g/dL (ref 3.5–5.2)
Alkaline Phosphatase: 40 U/L (ref 39–117)
BUN: 13 mg/dL (ref 6–23)
CALCIUM: 8.9 mg/dL (ref 8.4–10.5)
CO2: 22 mEq/L (ref 19–32)
Chloride: 103 mEq/L (ref 96–112)
Creatinine, Ser: 0.91 mg/dL (ref 0.50–1.10)
GFR calc non Af Amer: 76 mL/min — ABNORMAL LOW (ref >90)
GFR, EST AFRICAN AMERICAN: 88 mL/min — AB (ref >90)
Glucose, Bld: 219 mg/dL — ABNORMAL HIGH (ref 70–99)
Potassium: 3.8 mEq/L (ref 3.7–5.3)
SODIUM: 140 meq/L (ref 137–147)
TOTAL PROTEIN: 7.1 g/dL (ref 6.0–8.3)
Total Bilirubin: <0.2 mg/dL — ABNORMAL LOW (ref 0.3–1.2)

## 2014-04-11 LAB — URINE MICROSCOPIC-ADD ON

## 2014-04-11 LAB — POC URINE PREG, ED: PREG TEST UR: NEGATIVE

## 2014-04-11 LAB — LIPASE, BLOOD: Lipase: 48 U/L (ref 11–59)

## 2014-04-11 MED ORDER — ONDANSETRON HCL 4 MG/2ML IJ SOLN
4.0000 mg | Freq: Once | INTRAMUSCULAR | Status: AC
  Administered 2014-04-11: 4 mg via INTRAVENOUS
  Filled 2014-04-11: qty 2

## 2014-04-11 MED ORDER — ONDANSETRON HCL 4 MG PO TABS: 4.0000 mg | ORAL_TABLET | Freq: Four times a day (QID) | ORAL | Status: DC

## 2014-04-11 MED ORDER — FENTANYL CITRATE 0.05 MG/ML IJ SOLN
50.0000 ug | Freq: Once | INTRAMUSCULAR | Status: AC
  Administered 2014-04-11: 50 ug via INTRAVENOUS
  Filled 2014-04-11: qty 2

## 2014-04-11 MED ORDER — OXYCODONE-ACETAMINOPHEN 5-325 MG PO TABS: 1.0000 | ORAL_TABLET | ORAL | Status: DC | PRN

## 2014-04-11 MED ORDER — HYDROCODONE-ACETAMINOPHEN 5-325 MG PO TABS: 1.0000 | ORAL_TABLET | ORAL | Status: DC | PRN

## 2014-04-11 MED ORDER — TAMSULOSIN HCL 0.4 MG PO CAPS: 0.4000 mg | ORAL_CAPSULE | Freq: Every day | ORAL | Status: DC

## 2014-04-11 MED ORDER — HYDROMORPHONE HCL PF 1 MG/ML IJ SOLN
0.5000 mg | Freq: Once | INTRAMUSCULAR | Status: AC
  Administered 2014-04-11: 0.5 mg via INTRAVENOUS
  Filled 2014-04-11: qty 1

## 2014-04-11 MED ORDER — IBUPROFEN 600 MG PO TABS: 600.0000 mg | ORAL_TABLET | Freq: Four times a day (QID) | ORAL | Status: DC | PRN

## 2014-04-11 MED ORDER — MORPHINE SULFATE 4 MG/ML IJ SOLN
4.0000 mg | Freq: Once | INTRAMUSCULAR | Status: AC
  Administered 2014-04-11: 4 mg via INTRAVENOUS
  Filled 2014-04-11: qty 1

## 2014-04-11 NOTE — Discharge Instructions (Signed)

## 2014-04-11 NOTE — ED Notes (Signed)
Pt pacing around room; reports worsening pain in abdomen

## 2014-04-11 NOTE — ED Notes (Signed)
Pt arrives via EMS with sudden onset of lower abd pain, vomting and diarrhea. Radiates to back. 10/10 pain. 20 G IV placed in L AC. Hx of kidney stones, states this pain is different. States shes had urinary frequency in last week

## 2014-04-11 NOTE — ED Provider Notes (Addendum)
CSN: [id number]     Arrival date & time 04/11/14  0143 History   First MD Initiated Contact with Patient 04/11/14 0155     Chief Complaint  Patient presents with  . Abdominal Pain     (Consider location/radiation/quality/duration/timing/severity/associated sxs/prior Treatment) HPI  Patient is a 43 yo woman with a history of kidney stones. She is also s/p appendectomy remotely. She comes in with abrupt onset of 10/10, aching right lower quadrant abdominal pain with nausea. She has had two episodese of NBNB emesis. No fevers. Says she has not appreciated any modifying factors. No dysuria. Pink tinged urine noticed. LMP 1 week ago. Pain is nonradiating.   Past Medical History  Diagnosis Date  . Bursitis     bilateral hips, more on right hip   Past Surgical History  Procedure Laterality Date  . Appendectomy    . Tubal ligation  2009   Family History  Problem Relation Age of Onset  . Heart failure Other   . Hypertension Other   . Cancer Other    History  Substance Use Topics  . Smoking status: Former Smoker -- 1.00 packs/day  . Smokeless tobacco: Never Used  . Alcohol Use: Yes     Comment: occational   OB History   Grav Para Term Preterm Abortions TAB SAB Ect Mult Living                 Review of Systems  10 point review of symptoms obtained and is negative with the exceptions of symptoms noted abov.e   Allergies  Review of patient's allergies indicates no known allergies.  Home Medications   Prior to Admission medications   Medication Sig Start Date End Date Taking? Authorizing Provider  Biotin 5000 MCG TABS Take 1 tablet by mouth daily.    Historical Provider, MD  bisacodyl (DULCOLAX) 5 MG EC tablet Take 1 tablet (5 mg total) by mouth 2 (two) times daily. 02/19/14   Tiffany Marilu Favre, PA-C  Cyanocobalamin (VITAMIN B 12 PO) Take 2,500 mg by mouth daily.    Historical Provider, MD  ibuprofen (ADVIL,MOTRIN) 800 MG tablet Take 800 mg by mouth every 8 (eight) hours as  needed for fever, headache, mild pain, moderate pain or cramping.    Historical Provider, MD  ondansetron (ZOFRAN ODT) 4 MG disintegrating tablet Take 1 tablet (4 mg total) by mouth every 8 (eight) hours as needed for nausea or vomiting. 02/19/14   Tiffany Marilu Favre, PA-C  oxyCODONE (ROXICODONE) 5 MG immediate release tablet Take 1 tablet (5 mg total) by mouth every 6 (six) hours as needed for severe pain. 02/19/14   Tiffany Marilu Favre, PA-C  senna (SENOKOT) 8.6 MG tablet Take 2 tablets by mouth daily.    Historical Provider, MD  traMADol (ULTRAM) 50 MG tablet Take 1 tablet (50 mg total) by mouth every 6 (six) hours as needed. 02/19/14   Tiffany Marilu Favre, PA-C  Wheat Dextrin (EQ FIBER POWDER PO) Take 1 each by mouth daily. 1 tablespoon    Historical Provider, MD   BP 124/81  Pulse 57  Temp(Src) 97.8 F (36.6 C) (Oral)  Resp 16  Ht 5\' 6"  (1.676 m)  Wt 215 lb (97.523 kg)  BMI 34.72 kg/m2  SpO2 96%  LMP 04/04/2014 Physical Exam  Gen: well nourished and well developed appearing, uncomfortable appearing, tearful. Head: NCAT Ears: normal to inspection Nose: normal to inspection, no epistaxis or drainage Mouth: oral mucsoa is well hydrated appearing, normal posterior oropharynx Neck: supple,  no stridor CV: RRR, no murmur, palpable peripheral pulses Resp: lung sounds are clear to auscultation bilaterally, no wheeing or rhonchi or rales, normal respiratory effort.  Abd: soft, nontender, nondistended Extremities: normal to inspection.  Skin: warm and dry Neuro: CN ii - XII, no focal deficitis Psyche; normal affect, cooperative.  ED Course  Procedures (including critical care time) Labs Review  Results for orders placed during the hospital encounter of 04/11/14 (from the past 24 hour(s))  URINALYSIS, ROUTINE W REFLEX MICROSCOPIC     Status: Abnormal   Collection Time    04/11/14  2:06 AM      Result Value Ref Range   Color, Urine AMBER (*) YELLOW   APPearance TURBID (*) CLEAR   Specific  Gravity, Urine 1.032 (*) 1.005 - 1.030   pH 5.5  5.0 - 8.0   Glucose, UA 100 (*) NEGATIVE mg/dL   Hgb urine dipstick LARGE (*) NEGATIVE   Bilirubin Urine SMALL (*) NEGATIVE   Ketones, ur 40 (*) NEGATIVE mg/dL   Protein, ur 100 (*) NEGATIVE mg/dL   Urobilinogen, UA 0.2  0.0 - 1.0 mg/dL   Nitrite NEGATIVE  NEGATIVE   Leukocytes, UA TRACE (*) NEGATIVE  URINE MICROSCOPIC-ADD ON     Status: Abnormal   Collection Time    04/11/14  2:06 AM      Result Value Ref Range   Squamous Epithelial / LPF RARE  RARE   WBC, UA 0-2  <3 WBC/hpf   RBC / HPF TOO NUMEROUS TO COUNT  <3 RBC/hpf   Bacteria, UA FEW (*) RARE  CBC WITH DIFFERENTIAL     Status: Abnormal   Collection Time    04/11/14  2:08 AM      Result Value Ref Range   WBC 12.2 (*) 4.0 - 10.5 K/uL   RBC 3.83 (*) 3.87 - 5.11 MIL/uL   Hemoglobin 11.7 (*) 12.0 - 15.0 g/dL   HCT 35.2 (*) 36.0 - 46.0 %   MCV 91.9  78.0 - 100.0 fL   MCH 30.5  26.0 - 34.0 pg   MCHC 33.2  30.0 - 36.0 g/dL   RDW 12.4  11.5 - 15.5 %   Platelets 250  150 - 400 K/uL   Neutrophils Relative % 78 (*) 43 - 77 %   Neutro Abs 9.5 (*) 1.7 - 7.7 K/uL   Lymphocytes Relative 19  12 - 46 %   Lymphs Abs 2.3  0.7 - 4.0 K/uL   Monocytes Relative 3  3 - 12 %   Monocytes Absolute 0.4  0.1 - 1.0 K/uL   Eosinophils Relative 0  0 - 5 %   Eosinophils Absolute 0.1  0.0 - 0.7 K/uL   Basophils Relative 0  0 - 1 %   Basophils Absolute 0.0  0.0 - 0.1 K/uL  COMPREHENSIVE METABOLIC PANEL     Status: Abnormal   Collection Time    04/11/14  2:08 AM      Result Value Ref Range   Sodium 140  137 - 147 mEq/L   Potassium 3.8  3.7 - 5.3 mEq/L   Chloride 103  96 - 112 mEq/L   CO2 22  19 - 32 mEq/L   Glucose, Bld 219 (*) 70 - 99 mg/dL   BUN 13  6 - 23 mg/dL   Creatinine, Ser 0.91  0.50 - 1.10 mg/dL   Calcium 8.9  8.4 - 10.5 mg/dL   Total Protein 7.1  6.0 - 8.3 g/dL   Albumin  3.9  3.5 - 5.2 g/dL   AST 17  0 - 37 U/L   ALT 14  0 - 35 U/L   Alkaline Phosphatase 40  39 - 117 U/L   Total  Bilirubin <0.2 (*) 0.3 - 1.2 mg/dL   GFR calc non Af Amer 76 (*) >90 mL/min   GFR calc Af Amer 88 (*) >90 mL/min   Anion gap 15  5 - 15  LIPASE, BLOOD     Status: None   Collection Time    04/11/14  2:08 AM      Result Value Ref Range   Lipase 48  11 - 59 U/L  POC URINE PREG, ED     Status: None   Collection Time    04/11/14  2:15 AM      Result Value Ref Range   Preg Test, Ur NEGATIVE  NEGATIVE    Imaging Review US Renal  04/11/2014   CLINICAL DATA:  Abdominal pain.  EXAM: RENAL/URINARY TRACT ULTRASOUND COMPLETE  COMPARISON:  02/19/2014 abdominal CT  FINDINGS: Right Kidney:  Length: 12 cm. Mild hydronephrosis and proximal hydroureter. No visible cause, although urolithiasis noted on the right 02/2014.  Left Kidney:  Length: 11 cm. Echogenicity within normal limits. No mass or hydronephrosis visualized.  Bladder:  Limited evaluation due decompressed state. No evidence for wall thickening or debris.  IMPRESSION: Mild right hydronephrosis.   Electronically Signed   By: Jorje Guild M.D.   On: 04/11/2014 03:27      MDM     Patient with diagnosis of right ureteral colic with hematuria and right hydronephrosis which is non-obstructive. We will manage symptomatically with goal of discharge with plan for outpatient urology follow up.   Elyn Peers, MD 04/11/14 8144  Elyn Peers, MD 04/11/14 [phone]

## 2014-04-16 ENCOUNTER — Other Ambulatory Visit: Payer: Self-pay | Admitting: Urology

## 2014-04-19 ENCOUNTER — Encounter (HOSPITAL_COMMUNITY): Payer: Self-pay | Admitting: Pharmacy Technician

## 2014-04-21 ENCOUNTER — Encounter (HOSPITAL_COMMUNITY): Payer: Self-pay

## 2014-04-21 ENCOUNTER — Encounter (HOSPITAL_COMMUNITY)
Admission: RE | Admit: 2014-04-21 | Discharge: 2014-04-21 | Disposition: A | Discharge status: Home / Self Care | Payer: 59 | Source: Ambulatory Visit | Attending: Urology | Admitting: Urology

## 2014-04-21 DIAGNOSIS — N201 Calculus of ureter: Secondary | ICD-10-CM | POA: Diagnosis present

## 2014-04-21 DIAGNOSIS — Z538 Procedure and treatment not carried out for other reasons: Secondary | ICD-10-CM | POA: Diagnosis not present

## 2014-04-21 HISTORY — DX: Gastro-esophageal reflux disease without esophagitis: K21.9

## 2014-04-21 HISTORY — DX: Unspecified osteoarthritis, unspecified site: M19.90

## 2014-04-21 HISTORY — DX: Frequency of micturition: R35.0

## 2014-04-21 HISTORY — DX: Prediabetes: R73.03

## 2014-04-21 HISTORY — DX: Unspecified hemorrhoids: K64.9

## 2014-04-21 LAB — BASIC METABOLIC PANEL
Anion gap: 12 (ref 5–15)
BUN: 12 mg/dL (ref 6–23)
CHLORIDE: 100 meq/L (ref 96–112)
CO2: 27 mEq/L (ref 19–32)
CREATININE: 0.79 mg/dL (ref 0.50–1.10)
Calcium: 9.5 mg/dL (ref 8.4–10.5)
GFR calc non Af Amer: >90 mL/min (ref >90)
Glucose, Bld: 95 mg/dL (ref 70–99)
POTASSIUM: 3.8 meq/L (ref 3.7–5.3)
Sodium: 139 mEq/L (ref 137–147)

## 2014-04-21 LAB — CBC
HCT: 36.6 % (ref 36.0–46.0)
Hemoglobin: 12.4 g/dL (ref 12.0–15.0)
MCH: 31.2 pg (ref 26.0–34.0)
MCHC: 33.9 g/dL (ref 30.0–36.0)
MCV: 92 fL (ref 78.0–100.0)
Platelets: 256 10*3/uL (ref 150–400)
RBC: 3.98 MIL/uL (ref 3.87–5.11)
RDW: 12.1 % (ref 11.5–15.5)
WBC: 10.4 10*3/uL (ref 4.0–10.5)

## 2014-04-21 NOTE — Progress Notes (Signed)
Elevated glucose 219 on 04-11-14 labs, ekg done and bmet repeated, pt stated being told she was prediabetic 2 years ago

## 2014-04-21 NOTE — Patient Instructions (Addendum)
[place]  04/21/2014   Your procedure is scheduled on: Thursday September 3rd, 2015  Report to [facility] Main Entrance and follow signs to  [place] at 700 AM.  Call this number if you have problems the morning of surgery [phone]   Remember:  Do not eat food or drink liquids :After Midnight.     Take these medicines the morning of surgery with A SIP OF WATER: no meds to take                               You may not have any metal on your body including hair pins and piercings  Do not wear jewelry, make-up, lotions, powders, or deodorant.   Men may shave face and neck.  Do not bring valuables to the hospital. [place].  Contacts, dentures or bridgework may not be worn into surgery.  Leave suitcase in the car. After surgery it may be brought to your room.  For patients admitted to the hospital, checkout time is 11:00 AM the day of discharge.   Patients discharged the day of surgery will not be allowed to drive home.  Name and phone number of your driver: wayne significant other cell [phone] or [phone]  ________________________________________________________________________  [facility] - Preparing for Surgery Before surgery, you can play an important role.  Because skin is not sterile, your skin needs to be as free of germs as possible.  You can reduce the number of germs on your skin by washing with CHG (chlorahexidine gluconate) soap before surgery.  CHG is an antiseptic cleaner which kills germs and bonds with the skin to continue killing germs even after washing. Please DO NOT use if you have an allergy to CHG or antibacterial soaps.  If your skin becomes reddened/irritated stop using the CHG and inform your nurse when you arrive at Short Stay. Do not shave (including legs and underarms) for at least 48 hours prior to the first CHG shower.  You may shave your face/neck. Please follow these instructions  carefully:  1.  Shower with CHG Soap the night before surgery and the  morning of Surgery.  2.  If you choose to wash your hair, wash your hair first as usual with your  normal  shampoo.  3.  After you shampoo, rinse your hair and body thoroughly to remove the  shampoo.                           4.  Use CHG as you would any other liquid soap.  You can apply chg directly  to the skin and wash                       Gently with a scrungie or clean washcloth.  5.  Apply the CHG Soap to your body ONLY FROM THE NECK DOWN.   Do not use on face/ open                           Wound or open sores. Avoid contact with eyes, ears mouth and genitals (private parts).                       Wash face,  Genitals (private parts) with your normal soap.  6.  Wash thoroughly, paying special attention to the area where your surgery  will be performed.  7.  Thoroughly rinse your body with warm water from the neck down.  8.  DO NOT shower/wash with your normal soap after using and rinsing off  the CHG Soap.                9.  Pat yourself dry with a clean towel.            10.  Wear clean pajamas.            11.  Place clean sheets on your bed the night of your first shower and do not  sleep with pets. Day of Surgery : Do not apply any lotions/deodorants the morning of surgery.  Please wear clean clothes to the hospital/surgery center.  FAILURE TO FOLLOW THESE INSTRUCTIONS MAY RESULT IN THE CANCELLATION OF YOUR SURGERY PATIENT SIGNATURE_________________________________  NURSE SIGNATURE__________________________________  ________________________________________________________________________

## 2014-04-21 NOTE — Progress Notes (Signed)
Urine pregnancy 04-11-14 epic Cbc with 04-11-14 epic cmet 04-11-14 epic

## 2014-04-22 ENCOUNTER — Encounter (HOSPITAL_COMMUNITY): Payer: Self-pay | Admitting: Anesthesiology

## 2014-04-22 ENCOUNTER — Ambulatory Visit (HOSPITAL_COMMUNITY)
Admission: RE | Admit: 2014-04-22 | Discharge: 2014-04-22 | Disposition: A | Discharge status: Home / Self Care | Payer: 59 | Source: Ambulatory Visit | Attending: Urology | Admitting: Urology

## 2014-04-22 ENCOUNTER — Encounter (HOSPITAL_COMMUNITY)
Admission: RE | Disposition: A | Discharge status: Home / Self Care | Payer: Self-pay | Source: Ambulatory Visit | Attending: Urology

## 2014-04-22 DIAGNOSIS — Z538 Procedure and treatment not carried out for other reasons: Secondary | ICD-10-CM | POA: Insufficient documentation

## 2014-04-22 DIAGNOSIS — N201 Calculus of ureter: Secondary | ICD-10-CM | POA: Insufficient documentation

## 2014-04-22 SURGERY — CYSTOURETEROSCOPY, WITH RETROGRADE PYELOGRAM AND STENT INSERTION
Anesthesia: General

## 2014-04-22 MED ORDER — SODIUM CHLORIDE 0.9 % IJ SOLN
INTRAMUSCULAR | Status: AC
  Filled 2014-04-22: qty 10

## 2014-04-22 MED ORDER — ONDANSETRON HCL 4 MG/2ML IJ SOLN
INTRAMUSCULAR | Status: AC
  Filled 2014-04-22: qty 2

## 2014-04-22 MED ORDER — PROPOFOL 10 MG/ML IV BOLUS
INTRAVENOUS | Status: AC
  Filled 2014-04-22: qty 20

## 2014-04-22 MED ORDER — DEXAMETHASONE SODIUM PHOSPHATE 10 MG/ML IJ SOLN
INTRAMUSCULAR | Status: AC
  Filled 2014-04-22: qty 1

## 2014-04-22 MED ORDER — LIDOCAINE HCL (CARDIAC) 20 MG/ML IV SOLN
INTRAVENOUS | Status: AC
  Filled 2014-04-22: qty 5

## 2014-04-22 MED ORDER — EPHEDRINE SULFATE 50 MG/ML IJ SOLN
INTRAMUSCULAR | Status: AC
  Filled 2014-04-22: qty 1

## 2014-04-22 MED ORDER — MIDAZOLAM HCL 2 MG/2ML IJ SOLN
INTRAMUSCULAR | Status: AC
  Filled 2014-04-22: qty 2

## 2014-04-22 MED ORDER — FENTANYL CITRATE 0.05 MG/ML IJ SOLN
INTRAMUSCULAR | Status: AC
  Filled 2014-04-22: qty 2

## 2014-04-22 NOTE — Progress Notes (Signed)
Patient passed her kidney stone this am and brought it with her in a bottle to short stay. Dr. Gaynelle Arabian was notified . Surgery cancelled,  Stone in a labeled bottled taken to Dr. Arlyn Leak office as per his instruction

## 2014-05-19 ENCOUNTER — Telehealth: Payer: Self-pay | Admitting: Family Medicine

## 2014-05-19 ENCOUNTER — Encounter: Payer: Self-pay | Admitting: Family Medicine

## 2014-05-19 ENCOUNTER — Ambulatory Visit (INDEPENDENT_AMBULATORY_CARE_PROVIDER_SITE_OTHER): Payer: 59 | Admitting: Family Medicine

## 2014-05-19 VITALS — BP 128/80 | HR 101 | Temp 98.2°F | Resp 16 | Wt 217.1 lb

## 2014-05-19 DIAGNOSIS — M1610 Unilateral primary osteoarthritis, unspecified hip: Secondary | ICD-10-CM

## 2014-05-19 DIAGNOSIS — E669 Obesity, unspecified: Secondary | ICD-10-CM

## 2014-05-19 DIAGNOSIS — M1611 Unilateral primary osteoarthritis, right hip: Secondary | ICD-10-CM

## 2014-05-19 DIAGNOSIS — Z23 Encounter for immunization: Secondary | ICD-10-CM

## 2014-05-19 MED ORDER — ORLISTAT 120 MG PO CAPS: 120.0000 mg | ORAL_CAPSULE | Freq: Three times a day (TID) | ORAL | Status: DC

## 2014-05-19 MED ORDER — MELOXICAM 15 MG PO TABS: 15.0000 mg | ORAL_TABLET | Freq: Every day | ORAL | Status: DC

## 2014-05-19 NOTE — Telephone Encounter (Signed)
Pt should check price of OTC Alli

## 2014-05-19 NOTE — Patient Instructions (Signed)
Follow up by phone or MyChart in late November/early December to discuss weight loss progress Start the Orlistat 3x/day w/ meals- can take up to 1 hr after eating if you forgot to take before If you eat too much fat, you WILL have diarrhea Follow the [employer] weight loss program Start the Mobic daily w/ food for hip pain Call with any questions or concerns Hang in there!  You can do this!

## 2014-05-19 NOTE — Progress Notes (Signed)
Pre visit review using our clinic review tool, if applicable. No additional management support is needed unless otherwise documented below in the visit note. 

## 2014-05-19 NOTE — Assessment & Plan Note (Signed)
Ongoing issue for pt.  Discussed various options- Qsymia, Belviq, Contrave, Alli, phentermine.  Stressed need for healthy diet and regular exercise.  Pt recently signed up for insurance based weight loss program.  After discussing options, pt to start Alli as fat blocker to train herself on healthy food choices.  If not reaching goals, will plan on switching to Contrave.

## 2014-05-19 NOTE — Assessment & Plan Note (Signed)
New to provider, ongoing for pt.  Following w/ Ortho, has had injxns previously.  Start Mobic daily for pain/inflammation.  Pt expressed understanding and is in agreement w/ plan.

## 2014-05-19 NOTE — Progress Notes (Signed)
   Subjective:    Patient ID: Emma Wagner, female    DOB: Oct 14, 1970, 43 y.o.   MRN: [id number]  HPI Obesity- pt had 3 separate 'attacks' from single kidney stone.  Was scheduled for surgery but passed the stone the morning of surgery.  Reports she has not been able to exercise due to pain and pain medications.  Also has hx of hip pain that has required injxn and this has prevented exercise.  Pt wants to get back on track w/ weight loss plan.  Pt has looked up meds and wants my opinion- Orlistat, Belviq.  Pt has been on phentermine w/o difficulty.  Did not do well on Qsymia.  Signed up for weight loss program w/ [facility].  R hip arthritis- ongoing for pt.  Following w/ orthopedics.  Has had injxns previously.  Asking for recommendations for daily pain until she has appt   Review of Systems For ROS see HPI     Objective:   Physical Exam  Vitals reviewed. Constitutional: She is oriented to person, place, and time. She appears well-developed and well-nourished. No distress.  obese  HENT:  Head: Normocephalic and atraumatic.  Eyes: Conjunctivae and EOM are normal. Pupils are equal, round, and reactive to light.  Neck: Normal range of motion. Neck supple. No thyromegaly present.  Cardiovascular: Normal rate, regular rhythm, normal heart sounds and intact distal pulses.   No murmur heard. Pulmonary/Chest: Effort normal and breath sounds normal. No respiratory distress.  Abdominal: Soft. She exhibits no distension. There is no tenderness.  Musculoskeletal: She exhibits no edema.  Lymphadenopathy:    She has no cervical adenopathy.  Neurological: She is alert and oriented to person, place, and time.  Skin: Skin is warm and dry.  Psychiatric: She has a normal mood and affect. Her behavior is normal.          Assessment & Plan:

## 2014-05-19 NOTE — Telephone Encounter (Signed)
Caller name: Keishana  Relation to pt: self  Call back [place]: [facility] [phone]   Reason for call:  orlistat (XENICAL) 120 MG capsule is to expensive patient requesting phentermine 37.5 MG. Patient would like to discuss

## 2014-05-20 ENCOUNTER — Encounter: Payer: Self-pay | Admitting: Family Medicine

## 2014-05-20 NOTE — Telephone Encounter (Signed)
Can pt not have phentermine?

## 2014-05-20 NOTE — Telephone Encounter (Signed)
encounter closed, pt would like a different medication. Sent a [employer].

## 2014-05-20 NOTE — Telephone Encounter (Signed)
After the discussion at our office visit, she preferred to do another med b/c the phentermine is not as effective as previously.  Based on this, she should try the [employer]

## 2014-09-02 ENCOUNTER — Encounter (HOSPITAL_COMMUNITY): Payer: Self-pay | Admitting: Urology

## 2014-12-07 LAB — HM PAP SMEAR

## 2014-12-07 LAB — HM MAMMOGRAPHY

## 2015-05-11 ENCOUNTER — Encounter: Payer: Self-pay | Admitting: Behavioral Health

## 2015-05-11 ENCOUNTER — Telehealth: Payer: Self-pay | Admitting: Behavioral Health

## 2015-05-11 NOTE — Telephone Encounter (Signed)
Pre-Visit Call completed with patient and chart updated.   Pre-Visit Info documented in Specialty Comments under [employer].    

## 2015-05-11 NOTE — Telephone Encounter (Signed)
Unable to reach patient at time of Pre-Visit Call.  Left message for patient to return call when available.    

## 2015-05-11 NOTE — Addendum Note (Signed)
Addended by: Eduard Roux E on: 05/11/2015 01:44 PM   Modules accepted: Orders, Medications

## 2015-05-12 ENCOUNTER — Ambulatory Visit (INDEPENDENT_AMBULATORY_CARE_PROVIDER_SITE_OTHER): Payer: 59 | Admitting: Family Medicine

## 2015-05-12 ENCOUNTER — Encounter: Payer: Self-pay | Admitting: Family Medicine

## 2015-05-12 VITALS — BP 126/80 | HR 82 | Temp 98.0°F | Resp 16 | Ht 66.0 in | Wt 232.5 lb

## 2015-05-12 DIAGNOSIS — M1611 Unilateral primary osteoarthritis, right hip: Secondary | ICD-10-CM

## 2015-05-12 DIAGNOSIS — Z0000 Encounter for general adult medical examination without abnormal findings: Secondary | ICD-10-CM

## 2015-05-12 DIAGNOSIS — F418 Other specified anxiety disorders: Secondary | ICD-10-CM

## 2015-05-12 DIAGNOSIS — E669 Obesity, unspecified: Secondary | ICD-10-CM | POA: Diagnosis not present

## 2015-05-12 DIAGNOSIS — F329 Major depressive disorder, single episode, unspecified: Secondary | ICD-10-CM

## 2015-05-12 DIAGNOSIS — Z23 Encounter for immunization: Secondary | ICD-10-CM | POA: Diagnosis not present

## 2015-05-12 DIAGNOSIS — M79621 Pain in right upper arm: Secondary | ICD-10-CM

## 2015-05-12 DIAGNOSIS — M1990 Unspecified osteoarthritis, unspecified site: Secondary | ICD-10-CM

## 2015-05-12 DIAGNOSIS — F419 Anxiety disorder, unspecified: Secondary | ICD-10-CM

## 2015-05-12 NOTE — Patient Instructions (Signed)
We'll notify you of your lab results and make any changes if needed Please call Dr Toy Care and discuss your anxiety.  Your current meds are not working [employer] call you with your Ortho appt for both hip and arm pain Make sure you are drinking plenty of water, eating a low salt diet, elevating your legs as you are able, and attempting to be active to improve your swelling Try and make healthy food choices and get regular activity Call with any questions or concerns If you want to join [place] at the new [place] office, any scheduled appointments will automatically transfer and we will see you at [address] [place] [address] [place], [place], [place] [address]  Hang in there!!!

## 2015-05-12 NOTE — Progress Notes (Signed)
Pre visit review using our clinic review tool, if applicable. No additional management support is needed unless otherwise documented below in the visit note. 

## 2015-05-12 NOTE — Progress Notes (Signed)
   Subjective:    Patient ID: Emma Wagner, female    DOB: [birth year], 44 y.o.   MRN: [id number]  HPI Due for Tdap and Flu.  UTD on GYN  'i'm just not myself'- struggling w/ teenage daughter.  Feeling overwhelmed.  Has regained some weight.  Seeing Dr Toy Care (Psych).  'my anxiety is getting the best of me'.  Reports she is not currently on a daily controller medication b/c, 'that wasn't working for me' and now takes benzos 3x/day w/o control of sxs.  Not able to exercise due to R hip arthritis.  Pt needs 2nd opinion on hip.  R hip pain- pt has been seeing Dr Gladstone Lighter.  Had injxn w/o relief.  Medication w/o relief.  Pt has been told she has a torn labrum but Dr Gladstone Lighter does not want to do surgery.  R arm pain- pain is localized to upper arm w/ 'any movement'.  Having numbness and tingling from elbow down.  Wearing counterforce strap w/o improvement in pain.  Bilateral LE edema- chronic problem for pt.  Has previously been on 'water pills' w/ relief.  No longer exercising regularly.  Not following a low salt diet.  Swelling is worse when hot outside.   Review of Systems Patient reports no vision/ hearing changes, adenopathy,fever, weight change,  persistant/recurrent hoarseness , swallowing issues, chest pain, palpitations, persistant/recurrent cough, hemoptysis, dyspnea (rest/exertional/paroxysmal nocturnal), gastrointestinal bleeding (melena, rectal bleeding), abdominal pain, significant heartburn, bowel changes, GU symptoms (dysuria, hematuria, incontinence), Gyn symptoms (abnormal  bleeding, pain),  syncope, focal weakness, memory loss, skin/hair/nail changes, abnormal bruising or bleeding.     Objective:   Physical Exam General Appearance:    Alert, cooperative, no distress, appears stated age, obese.  Intermittently tearful  Head:    Normocephalic, without obvious abnormality, atraumatic  Eyes:    PERRL, conjunctiva/corneas clear, EOM's intact, fundi    benign, both eyes  Ears:    Normal  TM's and external ear canals, both ears  Nose:   Nares normal, septum midline, mucosa normal, no drainage    or sinus tenderness  Throat:   Lips, mucosa, and tongue normal; teeth and gums normal  Neck:   Supple, symmetrical, trachea midline, no adenopathy;    Thyroid: no enlargement/tenderness/nodules  Back:     Symmetric, no curvature, ROM normal, no CVA tenderness  Lungs:     Clear to auscultation bilaterally, respirations unlabored  Chest Wall:    No tenderness or deformity   Heart:    Regular rate and rhythm, S1 and S2 normal, no murmur, rub   or gallop  Breast Exam:    Deferred to GYN  Abdomen:     Soft, non-tender, bowel sounds active all four quadrants,    no masses, no organomegaly  Genitalia:    Deferred to GYN  Rectal:    Extremities:   Extremities normal, atraumatic, no cyanosis.  Trace LE edema bilaterally  Pulses:   2+ and symmetric all extremities  Skin:   Skin color, texture, turgor normal, no rashes or lesions  Lymph nodes:   Cervical, supraclavicular, and axillary nodes normal  Neurologic:   CNII-XII intact, normal strength, sensation and reflexes    throughout          Assessment & Plan:

## 2015-05-13 LAB — CBC WITH DIFFERENTIAL/PLATELET
BASOS ABS: 0.1 10*3/uL (ref 0.0–0.1)
Basophils Relative: 0.7 % (ref 0.0–3.0)
Eosinophils Absolute: 0.1 10*3/uL (ref 0.0–0.7)
Eosinophils Relative: 1.2 % (ref 0.0–5.0)
HCT: 39.8 % (ref 36.0–46.0)
Hemoglobin: 13.2 g/dL (ref 12.0–15.0)
LYMPHS ABS: 2.7 10*3/uL (ref 0.7–4.0)
Lymphocytes Relative: 26.1 % (ref 12.0–46.0)
MCHC: 33.1 g/dL (ref 30.0–36.0)
MCV: 91.7 fl (ref 78.0–100.0)
MONOS PCT: 3.7 % (ref 3.0–12.0)
Monocytes Absolute: 0.4 10*3/uL (ref 0.1–1.0)
NEUTROS PCT: 68.3 % (ref 43.0–77.0)
Neutro Abs: 7 10*3/uL (ref 1.4–7.7)
Platelets: 298 10*3/uL (ref 150.0–400.0)
RBC: 4.34 Mil/uL (ref 3.87–5.11)
RDW: 14.6 % (ref 11.5–15.5)
WBC: 10.2 10*3/uL (ref 4.0–10.5)

## 2015-05-13 LAB — LIPID PANEL
CHOLESTEROL: 170 mg/dL (ref 0–200)
HDL: 43.8 mg/dL (ref >39.00)
LDL Cholesterol: 104 mg/dL — ABNORMAL HIGH (ref 0–99)
NONHDL: 126.4
Total CHOL/HDL Ratio: 4
Triglycerides: 114 mg/dL (ref 0.0–149.0)
VLDL: 22.8 mg/dL (ref 0.0–40.0)

## 2015-05-13 LAB — BASIC METABOLIC PANEL
BUN: 9 mg/dL (ref 6–23)
CO2: 25 meq/L (ref 19–32)
CREATININE: 0.87 mg/dL (ref 0.40–1.20)
Calcium: 9 mg/dL (ref 8.4–10.5)
Chloride: 107 mEq/L (ref 96–112)
GFR: 75.03 mL/min (ref >60.00)
GLUCOSE: 87 mg/dL (ref 70–99)
POTASSIUM: 4.2 meq/L (ref 3.5–5.1)
SODIUM: 141 meq/L (ref 135–145)

## 2015-05-13 LAB — HEPATIC FUNCTION PANEL
ALBUMIN: 4.1 g/dL (ref 3.5–5.2)
ALT: 14 U/L (ref 0–35)
AST: 17 U/L (ref 0–37)
Alkaline Phosphatase: 36 U/L — ABNORMAL LOW (ref 39–117)
Bilirubin, Direct: 0.1 mg/dL (ref 0.0–0.3)
Total Bilirubin: 0.4 mg/dL (ref 0.2–1.2)
Total Protein: 7.4 g/dL (ref 6.0–8.3)

## 2015-05-13 LAB — HEMOGLOBIN A1C: HEMOGLOBIN A1C: 5.9 % (ref 4.6–6.5)

## 2015-05-13 LAB — TSH: TSH: 1.51 u[IU]/mL (ref 0.35–4.50)

## 2015-05-13 LAB — VITAMIN D 25 HYDROXY (VIT D DEFICIENCY, FRACTURES): VITD: 46.89 ng/mL (ref 30.00–100.00)

## 2015-05-15 NOTE — Assessment & Plan Note (Signed)
Deteriorated.  Pt spent the majority of the visit talking about her uncontrolled anxiety and depression.  She is currently seeing psych but doesn't feel that she is getting the response to her concerns that she needs.  Not currently on daily controller medication and is only taking benzos TID.  Discussed my concerns for addiction/dependency, ineffectiveness and stressed that she needs to contact psych to restate her concerns.  Will follow along but explained to pt that I would not write prescription when she is seeing psych.  Pt expressed understanding and is in agreement w/ plan.

## 2015-05-15 NOTE — Assessment & Plan Note (Signed)
New.  Pt reports pain will radiate down upper arm w/ any motion.  Already on daily NSAID.  Refer to ortho for complete evaluation and tx.

## 2015-05-15 NOTE — Assessment & Plan Note (Signed)
Ongoing problem for pt.  She is now requesting 2nd opinion as her current med management is not improving her pain and she is now not able to exercise due to pain.  Referral entered.

## 2015-05-15 NOTE — Assessment & Plan Note (Signed)
Pt's PE WNL w/ exception of obesity and painful ambulation due to R hip pain.  UTD on GYN.  Check labs.  Anticipatory guidance provided.

## 2015-05-15 NOTE — Assessment & Plan Note (Signed)
Chronic problem.  Pt is no longer able to exercise due to hip pain and is now gaining weight.  Stressed need for healthy diet.  Will continue to follow.  Check labs to risk stratify.

## 2015-09-30 ENCOUNTER — Other Ambulatory Visit: Payer: Self-pay | Admitting: Orthopedic Surgery

## 2015-10-20 NOTE — Pre-Procedure Instructions (Signed)
Emma Wagner  10/20/2015      [id number] [place] [place] [id number] [place] [id number] Phone: [phone] Fax: [phone]    Your procedure is scheduled on Monday, March 13th   Report to [facility] Admitting at 5:30 am             (Surgery time posted 7:30 am - 10:06 am)   Call this number if you have problems the morning of surgery:  [phone]   Remember:  Do not eat food or drink liquids after midnight  Sunday.  Take these medicines the morning of surgery with A SIP OF WATER : Adderall, Prozac, Prilosec                          4-5 days prior to surgery, STOP taking any herbal medications or supplements, vitamins, anti-inflammatories   Do not wear jewelry, make-up or nail polish.  Do not wear lotions, powders, or perfumes.  You may NOT wear deodorant the day of surgery.   Do not shave underarms & legs 48 hours prior to surgery.    Do not bring valuables to the hospital.  [facility] is not responsible for any belongings or valuables.  Contacts, dentures or bridgework may not be worn into surgery.  Leave your suitcase in the car.  After surgery it may be brought to your room. For patients admitted to the hospital, discharge time will be determined by your treatment team.   Name and phone number of your driver:      Please read over the following fact sheets that you were given. Pain Booklet, Coughing and Deep Breathing, Blood Transfusion Information, MRSA Information and Surgical Site Infection Prevention

## 2015-10-21 ENCOUNTER — Encounter (HOSPITAL_COMMUNITY)
Admission: RE | Admit: 2015-10-21 | Discharge: 2015-10-21 | Disposition: A | Discharge status: Home / Self Care | Payer: 59 | Source: Ambulatory Visit | Attending: Orthopedic Surgery | Admitting: Orthopedic Surgery

## 2015-10-21 ENCOUNTER — Ambulatory Visit (HOSPITAL_COMMUNITY)
Admission: RE | Admit: 2015-10-21 | Discharge: 2015-10-21 | Disposition: A | Discharge status: Home / Self Care | Payer: 59 | Source: Ambulatory Visit | Attending: Orthopedic Surgery | Admitting: Orthopedic Surgery

## 2015-10-21 ENCOUNTER — Encounter (HOSPITAL_COMMUNITY): Payer: Self-pay

## 2015-10-21 DIAGNOSIS — Z0183 Encounter for blood typing: Secondary | ICD-10-CM | POA: Insufficient documentation

## 2015-10-21 DIAGNOSIS — M1612 Unilateral primary osteoarthritis, left hip: Secondary | ICD-10-CM | POA: Insufficient documentation

## 2015-10-21 DIAGNOSIS — Z01818 Encounter for other preprocedural examination: Secondary | ICD-10-CM | POA: Insufficient documentation

## 2015-10-21 DIAGNOSIS — R9431 Abnormal electrocardiogram [ECG] [EKG]: Secondary | ICD-10-CM | POA: Diagnosis not present

## 2015-10-21 DIAGNOSIS — Z01812 Encounter for preprocedural laboratory examination: Secondary | ICD-10-CM | POA: Diagnosis not present

## 2015-10-21 HISTORY — DX: Depression, unspecified: F32.A

## 2015-10-21 HISTORY — DX: Chronic kidney disease, unspecified: N18.9

## 2015-10-21 HISTORY — DX: Headache: R51

## 2015-10-21 HISTORY — DX: Headache, unspecified: R51.9

## 2015-10-21 HISTORY — DX: Major depressive disorder, single episode, unspecified: F32.9

## 2015-10-21 HISTORY — DX: Other specified postprocedural states: Z98.890

## 2015-10-21 HISTORY — DX: Nausea with vomiting, unspecified: R11.2

## 2015-10-21 HISTORY — DX: Anxiety disorder, unspecified: F41.9

## 2015-10-21 LAB — BASIC METABOLIC PANEL
ANION GAP: 8 (ref 5–15)
BUN: 9 mg/dL (ref 6–20)
CHLORIDE: 106 mmol/L (ref 101–111)
CO2: 25 mmol/L (ref 22–32)
Calcium: 9.4 mg/dL (ref 8.9–10.3)
Creatinine, Ser: 1 mg/dL (ref 0.44–1.00)
GFR calc Af Amer: >60 mL/min (ref >60)
GFR calc non Af Amer: >60 mL/min (ref >60)
GLUCOSE: 98 mg/dL (ref 65–99)
POTASSIUM: 3.7 mmol/L (ref 3.5–5.1)
Sodium: 139 mmol/L (ref 135–145)

## 2015-10-21 LAB — URINALYSIS, ROUTINE W REFLEX MICROSCOPIC
BILIRUBIN URINE: NEGATIVE
Glucose, UA: NEGATIVE mg/dL
HGB URINE DIPSTICK: NEGATIVE
KETONES UR: 15 mg/dL — AB
Leukocytes, UA: NEGATIVE
NITRITE: NEGATIVE
Protein, ur: NEGATIVE mg/dL
Specific Gravity, Urine: 1.007 (ref 1.005–1.030)
pH: 5.5 (ref 5.0–8.0)

## 2015-10-21 LAB — CBC WITH DIFFERENTIAL/PLATELET
Basophils Absolute: 0 10*3/uL (ref 0.0–0.1)
Basophils Relative: 0 %
Eosinophils Absolute: 0.1 10*3/uL (ref 0.0–0.7)
Eosinophils Relative: 1 %
HEMATOCRIT: 40.2 % (ref 36.0–46.0)
HEMOGLOBIN: 13.2 g/dL (ref 12.0–15.0)
LYMPHS ABS: 2.8 10*3/uL (ref 0.7–4.0)
LYMPHS PCT: 35 %
MCH: 30.3 pg (ref 26.0–34.0)
MCHC: 32.8 g/dL (ref 30.0–36.0)
MCV: 92.2 fL (ref 78.0–100.0)
Monocytes Absolute: 0.5 10*3/uL (ref 0.1–1.0)
Monocytes Relative: 6 %
NEUTROS ABS: 4.6 10*3/uL (ref 1.7–7.7)
NEUTROS PCT: 58 %
Platelets: 248 10*3/uL (ref 150–400)
RBC: 4.36 MIL/uL (ref 3.87–5.11)
RDW: 14.3 % (ref 11.5–15.5)
WBC: 8 10*3/uL (ref 4.0–10.5)

## 2015-10-21 LAB — SURGICAL PCR SCREEN
MRSA, PCR: NEGATIVE
Staphylococcus aureus: NEGATIVE

## 2015-10-21 LAB — APTT: aPTT: 33 seconds (ref 24–37)

## 2015-10-21 LAB — PROTIME-INR
INR: 1.12 (ref 0.00–1.49)
Prothrombin Time: 14.6 seconds (ref 11.6–15.2)

## 2015-10-21 LAB — ABO/RH: ABO/RH(D): A POS

## 2015-10-21 LAB — HCG, SERUM, QUALITATIVE: PREG SERUM: NEGATIVE

## 2015-10-21 NOTE — Progress Notes (Signed)
She was labeled as being "pre-diabetic".  She has since lost a lot of weight, and takes no medications at all for diabetes.

## 2015-10-27 NOTE — H&P (Signed)
TOTAL HIP ADMISSION H&P  Patient is admitted for right total hip arthroplasty.  Subjective:  Chief Complaint: right hip pain  HPI: Emma Wagner, 45 y.o. female, has a history of pain and functional disability in the right hip(s) due to arthritis and patient has failed non-surgical conservative treatments for greater than 12 weeks to include NSAID's and/or analgesics, flexibility and strengthening excercises, weight reduction as appropriate and activity modification.  Onset of symptoms was gradual starting 5 years ago with gradually worsening course since that time.The patient noted no past surgery on the right hip(s).  Patient currently rates pain in the right hip at 10 out of 10 with activity. Patient has night pain, worsening of pain with activity and weight bearing, pain that interfers with activities of daily living and pain with passive range of motion. Patient has evidence of joint space narrowing by imaging studies. This condition presents safety issues increasing the risk of falls.    There is no current active infection.  Patient Active Problem List   Diagnosis Date Noted  . Pain of right upper arm 05/12/2015  . Hair loss 05/02/2012  . Prediabetes 02/20/2012  . Thrombosed external hemorrhoid 02/08/2012  . Perirectal abscess 02/07/2012  . Post-nasal drip 11/27/2011  . Anxiety and depression 11/27/2011  . Arthritis of right hip 11/27/2011  . General medical examination 10/29/2011  . Cough 09/17/2011  . Obesity (BMI 30-39.9) 09/17/2011  . PNA (pneumonia) 08/03/2011  . Tobacco abuse 08/03/2011  . X-ray of lung, abnormal 06/11/2007  . GERD 06/11/2007   Past Medical History  Diagnosis Date  . Bursitis     bilateral hips, more on right hip  . GERD (gastroesophageal reflux disease)   . Hemorrhoids   . Arthritis   . Frequent urination     while kidney stone evident  . PONV (postoperative nausea and vomiting)     nausea with appendectomy  . Borderline type 2 diabetes mellitus  since 2013    -lost weight and quit smoking and is no longer PRE diabetic  . Anxiety     add also  . Depression   . Chronic kidney disease     h/o kidney stone 2016  . Headache     "every blue moon. not often"    Past Surgical History  Procedure Laterality Date  . Appendectomy  1985  . Tubal ligation  2009  . Cesarean section  2009  . Wisdom teth      No prescriptions prior to admission   No Known Allergies  Social History  Substance Use Topics  . Smoking status: Former Smoker -- 1.00 packs/day for 26 years    Quit date: 07/20/2012  . Smokeless tobacco: Never Used  . Alcohol Use: Yes     Comment: occational    Family History  Problem Relation Age of Onset  . Heart failure Other   . Hypertension Other   . Cancer Other   . Anxiety disorder Other   . Depression Other      Review of Systems  Constitutional: Positive for malaise/fatigue.  HENT:       Sinus problems  Eyes: Negative.   Respiratory: Negative.   Cardiovascular: Positive for leg swelling.  Gastrointestinal: Positive for heartburn.  Genitourinary:       Kidney stones  Musculoskeletal: Positive for joint pain.  Skin: Negative.   Neurological: Negative.   Endo/Heme/Allergies: Negative.   Psychiatric/Behavioral: The patient is nervous/anxious and has insomnia.     Objective:  Physical Exam  Constitutional: She is oriented to person, place, and time. She appears well-developed and well-nourished.  HENT:  Head: Normocephalic and atraumatic.  Eyes: Pupils are equal, round, and reactive to light.  Neck: Normal range of motion. Neck supple.  Cardiovascular: Intact distal pulses.   Respiratory: Effort normal.  Musculoskeletal: She exhibits tenderness.  Patient has irritability of the hips with only 5 of internal rotation bilaterally and pain on internal rotation.  Foot tap is are negative she walks with an antalgic gait, right greater than left.  Her skin is intact she is neurovascular intact distally.   Normal sensation of the feet.  Normal pulses to the feet.  Neurological: She is alert and oriented to person, place, and time.  Skin: Skin is warm and dry.  Psychiatric: She has a normal mood and affect. Her behavior is normal. Judgment and thought content normal.    Vital signs in last 24 hours:    Labs:   Estimated body mass index is 37.54 kg/(m^2) as calculated from the following:   Height as of 05/12/15: 5\' 6"  (1.676 m).   Weight as of 05/12/15: 105.461 kg (232 lb 8 oz).   Imaging Review Plain radiographs demonstrate AP pelvis and crosstable lateral of the hips show loss of at least two thirds of the articular cartilage the lateral aspect of the right hip with a large acetabular cyst laterally.  Good maintenance of cartilage to the left hip.  Minimal spurring to both hips.  The left hip has a relatively shallow acetabulum consistent with mild developmental hip dysplasia.   Assessment/Plan:  End stage arthritis, right hip(s) with developmental hip dysplasia  The patient history, physical examination, clinical judgement of the provider and imaging studies are consistent with end stage degenerative joint disease of the right hip(s) and total hip arthroplasty is deemed medically necessary. The treatment options including medical management, injection therapy, arthroscopy and arthroplasty were discussed at length. The risks and benefits of total hip arthroplasty were presented and reviewed. The risks due to aseptic loosening, infection, stiffness, dislocation/subluxation,  thromboembolic complications and other imponderables were discussed.  The patient acknowledged the explanation, agreed to proceed with the plan and consent was signed. Patient is being admitted for inpatient treatment for surgery, pain control, PT, OT, prophylactic antibiotics, VTE prophylaxis, progressive ambulation and ADL's and discharge planning.The patient is planning to be discharged home with home health services

## 2015-10-28 MED ORDER — DEXTROSE 5 % IV SOLN
3.0000 g | INTRAVENOUS | Status: AC
  Administered 2015-10-31: 3 g via INTRAVENOUS
  Filled 2015-10-28 (×2): qty 3000

## 2015-10-28 MED ORDER — DEXTROSE-NACL 5-0.45 % IV SOLN: INTRAVENOUS | Status: DC

## 2015-10-30 DIAGNOSIS — M1611 Unilateral primary osteoarthritis, right hip: Secondary | ICD-10-CM | POA: Diagnosis present

## 2015-10-31 ENCOUNTER — Inpatient Hospital Stay (HOSPITAL_COMMUNITY): Payer: 59 | Admitting: Certified Registered"

## 2015-10-31 ENCOUNTER — Encounter (HOSPITAL_COMMUNITY): Payer: Self-pay | Admitting: *Deleted

## 2015-10-31 ENCOUNTER — Encounter (HOSPITAL_COMMUNITY)
Admission: RE | Disposition: A | Discharge status: Home / Self Care | Payer: Self-pay | Source: Ambulatory Visit | Attending: Orthopedic Surgery

## 2015-10-31 ENCOUNTER — Inpatient Hospital Stay (HOSPITAL_COMMUNITY): Payer: 59

## 2015-10-31 ENCOUNTER — Inpatient Hospital Stay (HOSPITAL_COMMUNITY)
Admission: RE | Admit: 2015-10-31 | Discharge: 2015-11-02 | DRG: 470 | Disposition: A | Discharge status: Home / Self Care | Payer: 59 | Source: Ambulatory Visit | Attending: Orthopedic Surgery | Admitting: Orthopedic Surgery

## 2015-10-31 DIAGNOSIS — Q6589 Other specified congenital deformities of hip: Secondary | ICD-10-CM | POA: Diagnosis not present

## 2015-10-31 DIAGNOSIS — N189 Chronic kidney disease, unspecified: Secondary | ICD-10-CM | POA: Diagnosis present

## 2015-10-31 DIAGNOSIS — M1611 Unilateral primary osteoarthritis, right hip: Secondary | ICD-10-CM | POA: Diagnosis present

## 2015-10-31 DIAGNOSIS — M1612 Unilateral primary osteoarthritis, left hip: Principal | ICD-10-CM | POA: Diagnosis present

## 2015-10-31 DIAGNOSIS — Z87891 Personal history of nicotine dependence: Secondary | ICD-10-CM

## 2015-10-31 DIAGNOSIS — D62 Acute posthemorrhagic anemia: Secondary | ICD-10-CM | POA: Diagnosis not present

## 2015-10-31 DIAGNOSIS — Z419 Encounter for procedure for purposes other than remedying health state, unspecified: Secondary | ICD-10-CM

## 2015-10-31 DIAGNOSIS — K219 Gastro-esophageal reflux disease without esophagitis: Secondary | ICD-10-CM | POA: Diagnosis present

## 2015-10-31 HISTORY — PX: TOTAL HIP ARTHROPLASTY: SHX124

## 2015-10-31 SURGERY — ARTHROPLASTY, HIP, TOTAL, ANTERIOR APPROACH
Anesthesia: Spinal | Site: Hip | Laterality: Left

## 2015-10-31 MED ORDER — DIAZEPAM 5 MG PO TABS: 5.0000 mg | ORAL_TABLET | Freq: Three times a day (TID) | ORAL | Status: DC | PRN

## 2015-10-31 MED ORDER — DIPHENHYDRAMINE HCL 12.5 MG/5ML PO ELIX: 12.5000 mg | ORAL_SOLUTION | ORAL | Status: DC | PRN

## 2015-10-31 MED ORDER — LACTATED RINGERS IV SOLN
INTRAVENOUS | Status: DC | PRN
  Administered 2015-10-31 (×2): via INTRAVENOUS

## 2015-10-31 MED ORDER — METHOCARBAMOL 500 MG PO TABS
500.0000 mg | ORAL_TABLET | Freq: Two times a day (BID) | ORAL | Status: DC
Start: 2015-10-31 — End: 2016-05-28

## 2015-10-31 MED ORDER — ONDANSETRON HCL 4 MG/2ML IJ SOLN
INTRAMUSCULAR | Status: AC
  Filled 2015-10-31: qty 2

## 2015-10-31 MED ORDER — DEXAMETHASONE SODIUM PHOSPHATE 10 MG/ML IJ SOLN
10.0000 mg | Freq: Once | INTRAMUSCULAR | Status: AC
  Administered 2015-11-01: 10 mg via INTRAVENOUS
  Filled 2015-10-31: qty 1

## 2015-10-31 MED ORDER — PROPOFOL 10 MG/ML IV BOLUS
INTRAVENOUS | Status: AC
  Filled 2015-10-31: qty 20

## 2015-10-31 MED ORDER — PHENOL 1.4 % MT LIQD: 1.0000 | OROMUCOSAL | Status: DC | PRN

## 2015-10-31 MED ORDER — POLYETHYLENE GLYCOL 3350 17 G PO PACK: 17.0000 g | PACK | Freq: Every day | ORAL | Status: DC | PRN

## 2015-10-31 MED ORDER — METHOCARBAMOL 500 MG PO TABS
ORAL_TABLET | ORAL | Status: AC
  Administered 2015-10-31: 500 mg via ORAL
  Filled 2015-10-31: qty 1

## 2015-10-31 MED ORDER — FLEET ENEMA 7-19 GM/118ML RE ENEM: 1.0000 | ENEMA | Freq: Once | RECTAL | Status: DC | PRN

## 2015-10-31 MED ORDER — TRANEXAMIC ACID 1000 MG/10ML IV SOLN
1000.0000 mg | INTRAVENOUS | Status: AC
  Administered 2015-10-31: 1000 mg via INTRAVENOUS
  Filled 2015-10-31: qty 10

## 2015-10-31 MED ORDER — FENTANYL CITRATE (PF) 100 MCG/2ML IJ SOLN
25.0000 ug | INTRAMUSCULAR | Status: DC | PRN
  Administered 2015-10-31: 25 ug via INTRAVENOUS
  Administered 2015-10-31: 50 ug via INTRAVENOUS
  Administered 2015-10-31: 25 ug via INTRAVENOUS

## 2015-10-31 MED ORDER — KCL IN DEXTROSE-NACL 20-5-0.45 MEQ/L-%-% IV SOLN
INTRAVENOUS | Status: DC
  Administered 2015-10-31 – 2015-11-02 (×3): via INTRAVENOUS
  Filled 2015-10-31 (×3): qty 1000

## 2015-10-31 MED ORDER — METHOCARBAMOL 1000 MG/10ML IJ SOLN
500.0000 mg | Freq: Four times a day (QID) | INTRAMUSCULAR | Status: DC | PRN
  Filled 2015-10-31: qty 5

## 2015-10-31 MED ORDER — BUPROPION HCL ER (XL) 150 MG PO TB24
150.0000 mg | ORAL_TABLET | Freq: Every day | ORAL | Status: DC
  Administered 2015-11-01 – 2015-11-02 (×2): 150 mg via ORAL
  Filled 2015-10-31 (×2): qty 1

## 2015-10-31 MED ORDER — ASPIRIN EC 325 MG PO TBEC: 325.0000 mg | DELAYED_RELEASE_TABLET | Freq: Two times a day (BID) | ORAL | Status: DC

## 2015-10-31 MED ORDER — ONDANSETRON HCL 4 MG PO TABS
4.0000 mg | ORAL_TABLET | Freq: Four times a day (QID) | ORAL | Status: DC | PRN
  Administered 2015-11-02: 4 mg via ORAL
  Filled 2015-10-31: qty 1

## 2015-10-31 MED ORDER — TRANEXAMIC ACID 1000 MG/10ML IV SOLN
2000.0000 mg | Freq: Once | INTRAVENOUS | Status: AC
  Administered 2015-10-31: 2000 mg via TOPICAL
  Filled 2015-10-31: qty 20

## 2015-10-31 MED ORDER — ASPIRIN EC 325 MG PO TBEC
325.0000 mg | DELAYED_RELEASE_TABLET | Freq: Every day | ORAL | Status: DC
  Administered 2015-11-01 – 2015-11-02 (×2): 325 mg via ORAL
  Filled 2015-10-31 (×2): qty 1

## 2015-10-31 MED ORDER — FENTANYL CITRATE (PF) 250 MCG/5ML IJ SOLN
INTRAMUSCULAR | Status: DC | PRN
  Administered 2015-10-31: 50 ug via INTRAVENOUS

## 2015-10-31 MED ORDER — DEXAMETHASONE SODIUM PHOSPHATE 4 MG/ML IJ SOLN
INTRAMUSCULAR | Status: DC | PRN
  Administered 2015-10-31: 4 mg via INTRAVENOUS

## 2015-10-31 MED ORDER — LACTATED RINGERS IV SOLN: INTRAVENOUS | Status: DC

## 2015-10-31 MED ORDER — DEXAMETHASONE SODIUM PHOSPHATE 4 MG/ML IJ SOLN
INTRAMUSCULAR | Status: AC
  Filled 2015-10-31: qty 1

## 2015-10-31 MED ORDER — PHENYLEPHRINE HCL 10 MG/ML IJ SOLN
10.0000 mg | INTRAVENOUS | Status: DC | PRN
  Administered 2015-10-31: 10 ug via INTRAVENOUS

## 2015-10-31 MED ORDER — VITAMIN B-12 1000 MCG PO TABS
1000.0000 ug | ORAL_TABLET | Freq: Every day | ORAL | Status: DC
  Administered 2015-11-01 – 2015-11-02 (×2): 1000 ug via ORAL
  Filled 2015-10-31 (×2): qty 1

## 2015-10-31 MED ORDER — HYDROMORPHONE HCL 1 MG/ML IJ SOLN
0.5000 mg | INTRAMUSCULAR | Status: DC | PRN
  Administered 2015-10-31 – 2015-11-01 (×4): 1 mg via INTRAVENOUS
  Filled 2015-10-31 (×4): qty 1

## 2015-10-31 MED ORDER — BUPIVACAINE-EPINEPHRINE (PF) 0.5% -1:200000 IJ SOLN
INTRAMUSCULAR | Status: DC | PRN
  Administered 2015-10-31: 3 mL

## 2015-10-31 MED ORDER — METOCLOPRAMIDE HCL 5 MG PO TABS: 5.0000 mg | ORAL_TABLET | Freq: Three times a day (TID) | ORAL | Status: DC | PRN

## 2015-10-31 MED ORDER — TRANEXAMIC ACID 1000 MG/10ML IV SOLN
1000.0000 mg | Freq: Once | INTRAVENOUS | Status: DC
  Filled 2015-10-31: qty 10

## 2015-10-31 MED ORDER — OXYCODONE HCL 5 MG PO TABS
ORAL_TABLET | ORAL | Status: AC
  Administered 2015-10-31: 5 mg via ORAL
  Filled 2015-10-31: qty 1

## 2015-10-31 MED ORDER — ONDANSETRON HCL 4 MG/2ML IJ SOLN
INTRAMUSCULAR | Status: DC | PRN
  Administered 2015-10-31: 4 mg via INTRAVENOUS

## 2015-10-31 MED ORDER — PROPOFOL 1000 MG/100ML IV EMUL
INTRAVENOUS | Status: AC
  Filled 2015-10-31: qty 200

## 2015-10-31 MED ORDER — MENTHOL 3 MG MT LOZG: 1.0000 | LOZENGE | OROMUCOSAL | Status: DC | PRN

## 2015-10-31 MED ORDER — FLUOXETINE HCL 20 MG PO CAPS
20.0000 mg | ORAL_CAPSULE | Freq: Every day | ORAL | Status: DC
  Administered 2015-11-01 – 2015-11-02 (×2): 20 mg via ORAL
  Filled 2015-10-31 (×2): qty 1

## 2015-10-31 MED ORDER — FENTANYL CITRATE (PF) 250 MCG/5ML IJ SOLN
INTRAMUSCULAR | Status: AC
  Filled 2015-10-31: qty 5

## 2015-10-31 MED ORDER — KCL IN DEXTROSE-NACL 20-5-0.45 MEQ/L-%-% IV SOLN
INTRAVENOUS | Status: AC
  Filled 2015-10-31: qty 1000

## 2015-10-31 MED ORDER — BISACODYL 5 MG PO TBEC: 5.0000 mg | DELAYED_RELEASE_TABLET | Freq: Every day | ORAL | Status: DC | PRN

## 2015-10-31 MED ORDER — MIDAZOLAM HCL 2 MG/2ML IJ SOLN
INTRAMUSCULAR | Status: AC
  Filled 2015-10-31: qty 2

## 2015-10-31 MED ORDER — LIDOCAINE HCL (CARDIAC) 20 MG/ML IV SOLN
INTRAVENOUS | Status: AC
  Filled 2015-10-31: qty 5

## 2015-10-31 MED ORDER — ALUMINUM HYDROXIDE GEL 320 MG/5ML PO SUSP
15.0000 mL | ORAL | Status: DC | PRN
  Filled 2015-10-31: qty 30

## 2015-10-31 MED ORDER — GLYCOPYRROLATE 0.2 MG/ML IJ SOLN
INTRAMUSCULAR | Status: AC
  Filled 2015-10-31: qty 2

## 2015-10-31 MED ORDER — MEPERIDINE HCL 25 MG/ML IJ SOLN: 6.2500 mg | INTRAMUSCULAR | Status: DC | PRN

## 2015-10-31 MED ORDER — DOCUSATE SODIUM 100 MG PO CAPS
100.0000 mg | ORAL_CAPSULE | Freq: Two times a day (BID) | ORAL | Status: DC
  Administered 2015-11-01 – 2015-11-02 (×3): 100 mg via ORAL
  Filled 2015-10-31 (×4): qty 1

## 2015-10-31 MED ORDER — ONDANSETRON HCL 4 MG/2ML IJ SOLN
4.0000 mg | Freq: Four times a day (QID) | INTRAMUSCULAR | Status: DC | PRN
  Administered 2015-10-31 – 2015-11-01 (×4): 4 mg via INTRAVENOUS
  Filled 2015-10-31 (×4): qty 2

## 2015-10-31 MED ORDER — OXYCODONE HCL 5 MG PO TABS
5.0000 mg | ORAL_TABLET | ORAL | Status: DC | PRN
  Administered 2015-10-31 (×2): 10 mg via ORAL
  Administered 2015-10-31: 5 mg via ORAL
  Administered 2015-11-01 – 2015-11-02 (×10): 10 mg via ORAL
  Filled 2015-10-31 (×12): qty 2

## 2015-10-31 MED ORDER — PHENYLEPHRINE 40 MCG/ML (10ML) SYRINGE FOR IV PUSH (FOR BLOOD PRESSURE SUPPORT)
PREFILLED_SYRINGE | INTRAVENOUS | Status: AC
  Filled 2015-10-31: qty 10

## 2015-10-31 MED ORDER — PROPOFOL 500 MG/50ML IV EMUL
INTRAVENOUS | Status: DC | PRN
  Administered 2015-10-31: 50 ug/kg/min via INTRAVENOUS

## 2015-10-31 MED ORDER — ACETAMINOPHEN 650 MG RE SUPP: 650.0000 mg | Freq: Four times a day (QID) | RECTAL | Status: DC | PRN

## 2015-10-31 MED ORDER — OXYCODONE-ACETAMINOPHEN 5-325 MG PO TABS: 1.0000 | ORAL_TABLET | ORAL | Status: DC | PRN

## 2015-10-31 MED ORDER — PROMETHAZINE HCL 25 MG/ML IJ SOLN: 6.2500 mg | INTRAMUSCULAR | Status: DC | PRN

## 2015-10-31 MED ORDER — PROPOFOL 10 MG/ML IV BOLUS
INTRAVENOUS | Status: DC | PRN
  Administered 2015-10-31: 30 mg via INTRAVENOUS
  Administered 2015-10-31: 20 mg via INTRAVENOUS

## 2015-10-31 MED ORDER — METOCLOPRAMIDE HCL 5 MG/ML IJ SOLN
5.0000 mg | Freq: Three times a day (TID) | INTRAMUSCULAR | Status: DC | PRN
  Administered 2015-10-31 – 2015-11-01 (×3): 10 mg via INTRAVENOUS
  Filled 2015-10-31 (×3): qty 2

## 2015-10-31 MED ORDER — METHOCARBAMOL 500 MG PO TABS
500.0000 mg | ORAL_TABLET | Freq: Four times a day (QID) | ORAL | Status: DC | PRN
  Administered 2015-10-31 – 2015-11-02 (×7): 500 mg via ORAL
  Filled 2015-10-31 (×6): qty 1

## 2015-10-31 MED ORDER — ACETAMINOPHEN 325 MG PO TABS
650.0000 mg | ORAL_TABLET | Freq: Four times a day (QID) | ORAL | Status: DC | PRN
  Administered 2015-11-02: 650 mg via ORAL
  Filled 2015-10-31: qty 2

## 2015-10-31 MED ORDER — 0.9 % SODIUM CHLORIDE (POUR BTL) OPTIME
TOPICAL | Status: DC | PRN
  Administered 2015-10-31: 1000 mL

## 2015-10-31 MED ORDER — AMPHETAMINE-DEXTROAMPHETAMINE 10 MG PO TABS
10.0000 mg | ORAL_TABLET | Freq: Every day | ORAL | Status: DC
  Administered 2015-11-01 – 2015-11-02 (×2): 10 mg via ORAL
  Filled 2015-10-31 (×2): qty 1

## 2015-10-31 MED ORDER — MIDAZOLAM HCL 2 MG/2ML IJ SOLN
INTRAMUSCULAR | Status: DC | PRN
  Administered 2015-10-31: 2 mg via INTRAVENOUS

## 2015-10-31 MED ORDER — VITAMIN B-6 100 MG PO TABS
100.0000 mg | ORAL_TABLET | Freq: Every day | ORAL | Status: DC
  Filled 2015-10-31 (×4): qty 1

## 2015-10-31 MED ORDER — PANTOPRAZOLE SODIUM 40 MG PO TBEC
40.0000 mg | DELAYED_RELEASE_TABLET | Freq: Every day | ORAL | Status: DC
  Administered 2015-11-01 – 2015-11-02 (×2): 40 mg via ORAL
  Filled 2015-10-31 (×2): qty 1

## 2015-10-31 MED ORDER — CHLORHEXIDINE GLUCONATE 4 % EX LIQD: 60.0000 mL | Freq: Once | CUTANEOUS | Status: DC

## 2015-10-31 MED ORDER — GLYCOPYRROLATE 0.2 MG/ML IJ SOLN
INTRAMUSCULAR | Status: DC | PRN
  Administered 2015-10-31: 0.2 mg via INTRAVENOUS

## 2015-10-31 MED ORDER — ALBUMIN HUMAN 5 % IV SOLN
INTRAVENOUS | Status: DC | PRN
  Administered 2015-10-31 (×2): via INTRAVENOUS

## 2015-10-31 MED ORDER — FENTANYL CITRATE (PF) 100 MCG/2ML IJ SOLN
INTRAMUSCULAR | Status: AC
  Administered 2015-10-31: 50 ug via INTRAVENOUS
  Filled 2015-10-31: qty 2

## 2015-10-31 SURGICAL SUPPLY — 52 items
BAG DECANTER FOR FLEXI CONT (MISCELLANEOUS) ×2 IMPLANT
BLADE SURG ROTATE 9660 (MISCELLANEOUS) IMPLANT
CAPT HIP TOTAL 2 ×2 IMPLANT
COVER PERINEAL POST (MISCELLANEOUS) ×3 IMPLANT
COVER SURGICAL LIGHT HANDLE (MISCELLANEOUS) ×5 IMPLANT
DRAPE C-ARM 42X72 X-RAY (DRAPES) ×3 IMPLANT
DRAPE IMP U-DRAPE 54X76 (DRAPES) ×2 IMPLANT
DRAPE INCISE IOBAN 66X45 STRL (DRAPES) ×2 IMPLANT
DRAPE STERI IOBAN 125X83 (DRAPES) ×3 IMPLANT
DRAPE U-SHAPE 47X51 STRL (DRAPES) ×6 IMPLANT
DRSG AQUACEL AG ADV 3.5X10 (GAUZE/BANDAGES/DRESSINGS) ×3 IMPLANT
DURAPREP 26ML APPLICATOR (WOUND CARE) ×3 IMPLANT
ELECT BLADE 4.0 EZ CLEAN MEGAD (MISCELLANEOUS) ×3
ELECT REM PT RETURN 9FT ADLT (ELECTROSURGICAL) ×3
ELECTRODE BLDE 4.0 EZ CLN MEGD (MISCELLANEOUS) ×1 IMPLANT
ELECTRODE REM PT RTRN 9FT ADLT (ELECTROSURGICAL) ×1 IMPLANT
FACESHIELD WRAPAROUND (MASK) ×6 IMPLANT
FACESHIELD WRAPAROUND OR TEAM (MASK) ×2 IMPLANT
GLOVE BIO SURGEON STRL SZ7.5 (GLOVE) ×5 IMPLANT
GLOVE BIO SURGEON STRL SZ8.5 (GLOVE) ×6 IMPLANT
GLOVE BIOGEL PI IND STRL 8 (GLOVE) ×2 IMPLANT
GLOVE BIOGEL PI IND STRL 9 (GLOVE) ×1 IMPLANT
GLOVE BIOGEL PI INDICATOR 8 (GLOVE) ×4
GLOVE BIOGEL PI INDICATOR 9 (GLOVE) ×2
GOWN STRL REUS W/ TWL LRG LVL3 (GOWN DISPOSABLE) ×1 IMPLANT
GOWN STRL REUS W/ TWL XL LVL3 (GOWN DISPOSABLE) ×2 IMPLANT
GOWN STRL REUS W/TWL LRG LVL3 (GOWN DISPOSABLE) ×9
GOWN STRL REUS W/TWL XL LVL3 (GOWN DISPOSABLE) ×6
KIT BASIN OR (CUSTOM PROCEDURE TRAY) ×3 IMPLANT
KIT ROOM TURNOVER OR (KITS) ×3 IMPLANT
MANIFOLD NEPTUNE II (INSTRUMENTS) ×3 IMPLANT
NS IRRIG 1000ML POUR BTL (IV SOLUTION) ×3 IMPLANT
PACK TOTAL JOINT (CUSTOM PROCEDURE TRAY) ×3 IMPLANT
PAD ARMBOARD 7.5X6 YLW CONV (MISCELLANEOUS) ×6 IMPLANT
SAW OSC TIP CART 19.5X105X1.3 (SAW) ×3 IMPLANT
SPONGE LAP 18X18 X RAY DECT (DISPOSABLE) ×4 IMPLANT
SUT ETHIBOND NAB CT1 #1 30IN (SUTURE) ×6 IMPLANT
SUT VIC AB 0 CTX 36 (SUTURE) ×3
SUT VIC AB 0 CTX36XBRD ANTBCTR (SUTURE) ×1 IMPLANT
SUT VIC AB 1 CT1 27 (SUTURE) ×3
SUT VIC AB 1 CT1 27XBRD ANBCTR (SUTURE) IMPLANT
SUT VIC AB 1 CTX 36 (SUTURE) ×3
SUT VIC AB 1 CTX36XBRD ANBCTR (SUTURE) ×1 IMPLANT
SUT VIC AB 2-0 CT1 27 (SUTURE) ×3
SUT VIC AB 2-0 CT1 TAPERPNT 27 (SUTURE) IMPLANT
SUT VIC AB 2-0 CTX 27 (SUTURE) ×3 IMPLANT
SUT VIC AB 3-0 PS2 18 (SUTURE) ×3
SUT VIC AB 3-0 PS2 18XBRD (SUTURE) ×1 IMPLANT
TOWEL OR 17X24 6PK STRL BLUE (TOWEL DISPOSABLE) ×3 IMPLANT
TOWEL OR 17X26 10 PK STRL BLUE (TOWEL DISPOSABLE) ×3 IMPLANT
TRAY FOLEY CATH 14FR (SET/KITS/TRAYS/PACK) IMPLANT
WATER STERILE IRR 1000ML POUR (IV SOLUTION) ×3 IMPLANT

## 2015-10-31 NOTE — Anesthesia Procedure Notes (Signed)
Spinal  Patient location during procedure: OR Staffing Anesthesiologist: Gwynn Crossley Performed by: anesthesiologist  Preanesthetic Checklist Completed: patient identified, site marked, surgical consent, pre-op evaluation, timeout performed, IV checked, risks and benefits discussed and monitors and equipment checked Spinal Block Patient position: sitting Prep: Betadine Patient monitoring: heart rate, continuous pulse ox and blood pressure Approach: right paramedian Location: L3-4 Injection technique: single-shot Needle Needle type: Sprotte  Needle gauge: 24 G Needle length: 9 cm Additional Notes Expiration date of kit checked and confirmed. Patient tolerated procedure well, without complications.     

## 2015-10-31 NOTE — Op Note (Signed)
OPERATIVE REPORT    DATE OF PROCEDURE:  10/31/2015       PREOPERATIVE DIAGNOSIS:  LEFT HIP OSTEOARTHRITIS                                                          POSTOPERATIVE DIAGNOSIS:  LEFT HIP OSTEOARTHRITIS                                                           PROCEDURE: Anterior L total hip arthroplasty using a 48 mm DePuy Pinnacle  Cup, [employer], 0-degree polyethylene liner, a +5 mm 32 ceramic head, a 2 Depuy Triloc stem   SURGEON: Irean Kendricks J    ASSISTANT:   [employer] K[employer]  (present throughout entire procedure and necessary for timely completion of the procedure)   ANESTHESIA: Spinal BLOOD LOSS: 500 FLUID REPLACEMENT: 1600 crystalloid Antibiotic: 3gm ancef Tranexamic Acid: 1gm iv 2 gm topical COMPLICATIONS: none    INDICATIONS FOR PROCEDURE: A 45 y.o. year-old With  LEFT HIP OSTEOARTHRITIS   for 2 years, x-rays show bone-on-bone arthritic changes, and osteophytes. Despite conservative measures with observation, anti-inflammatory medicine, narcotics, use of a cane, has severe unremitting pain and can ambulate only a few blocks before resting. Patient desires elective L total hip arthroplasty to decrease pain and increase function. The risks, benefits, and alternatives were discussed at length including but not limited to the risks of infection, bleeding, nerve injury, stiffness, blood clots, the need for revision surgery, cardiopulmonary complications, among others, and they were willing to proceed. Questions answered     PROCEDURE IN DETAIL: The patient was identified by armband,  received preoperative IV antibiotics in the holding area at [facility], taken to the operating room , appropriate anesthetic monitors  were attached and  anesthesia was induced with the patienton the gurney. The HANA boots were applied to the feet and he was then transferred to the HANA table with a peroneal post and support underneath the non-operative le, which  was locked in 5 lb traction. Theoperative lower extremity was then prepped and draped in the usual sterile fashion from just above the iliac crest to the knee. And a timeout procedure was performed. We then made a 13 cm incision along the interval at the leading edge of the tensor fascia lata of starting at 2 cm lateral to and 2 cm distal to the ASIS. Small bleeders in the skin and subcutaneous tissue identified and cauterized we dissected down to the fascia and made an incision in the fascia allowing [place] to elevate the fascia of the tensor muscle and exploited the interval between the rectus and the tensor fascia lata. A Hohmann retractor was then placed along the superior neck of the femur and a Cobra retractor along the inferior neck of the femur we teed the capsule starting out at the superior anterior aspect of the acetabulum going distally and made the T along the neck both leaflets of the T were tagged with #2 Ethibond suture. Cobra retractors were then placed along the inferior and superior neck allowing [place] to perform a standard neck cut and  removed the femoral head with a power corkscrew. We then placed a right angle Hohmann retractor along the anterior aspect of the acetabulum a spiked Cobra in the cotyloid notch and posteriorly a Muelller retractor. We then sequentially reamed up to a 48 mm basket reamer obtaining good coverage in all quadrants, verified by C-arm imaging. Under C-arm control with and hammered into place a 48 mm Pinnacle cup in 45 of abduction and 15 of anteversion. The cup seated nicely and required no supplemental screws. We then placed a central hole Eliminator and a 0 polyethylene liner. The foot was then externally rotated to 100, the HANA elevator was placed around the flare of the greater trochanter and the limb was extended and abducted delivering the proximal femur up into the wound. A medium Hohmann retractor was placed over the greater trochanter and a Mueller retractor along  the posterior femoral neck completing the exposure. We then performed releases superiorly and and inferiorly of the capsule going back to the pirformis fossa superiorly and to the lesser trochanter inferiorly. We then entered the proximal femur with the box cutting offset chisel followed by, a canal sounder, the chili pepper and broaching up to a 2 broach. This seated nicely and we reamed the calcar. A trial reduction was performed with a +5 mm 32 mm head.The limb lengths were excellent the hip was stable in 90 of external rotation. At this point the trial components removed and we hammered into place a # 2 Tri-Lock stem with Gryption coating. This was a high offset stem and a + 5 mm ceramic ball was then hammered into place the hip was reduced and final C-arm images obtained. The wound was thoroughly irrigated with normal saline solution. We repaired the ant capsule and the tensor fascia lot a with running 0 vicryl suture. the subcutaneous tissue was closed with 2-0 and 3-0 Vicryl suture followed by an Aquacil dressing. At this point the patient was awaken and transferred to hospital gurney without difficulty. The subcutaneous tissue with 0 and 2-0 undyed Vicryl suture and the skin with running  3-0 vicryl subcuticular suture. Aquacil dressing was applied. The patient was then unclamped, rolled supine, awaken extubated and taken to recovery room without difficulty in stable condition.   Malachi Suderman J 10/31/2015, 9:43 AM

## 2015-10-31 NOTE — Progress Notes (Signed)
PT Cancellation Note  Patient Details Name: Emma Wagner MRN: [id number] DOB: 1971/01/16   Cancelled Treatment:    Reason Eval/Treat Not Completed: Patient declined, reporting that she tried to get to [facility] with RN tech earlier and almost passed out (had to lay back down instead of getting to the [facility]).  Pt would like to wait until tomorrow to try to get OOB to the recliner chair.     Thanks,  Barbarann Ehlers. [place], [place], DPT [phone]   10/31/2015, 4:39 PM

## 2015-10-31 NOTE — Anesthesia Preprocedure Evaluation (Addendum)
Anesthesia Evaluation  Patient identified by MRN, date of birth, ID band Patient awake    Reviewed: Allergy & Precautions, NPO status , Patient's Chart, lab work & pertinent test results  History of Anesthesia Complications (+) PONV  Airway Mallampati: II  TM Distance: >3 FB Neck ROM: Full    Dental no notable dental hx.    Pulmonary neg pulmonary ROS, former smoker,    Pulmonary exam normal breath sounds clear to auscultation       Cardiovascular negative cardio ROS Normal cardiovascular exam Rhythm:Regular Rate:Normal     Neuro/Psych negative neurological ROS  negative psych ROS   GI/Hepatic negative GI ROS, Neg liver ROS,   Endo/Other  negative endocrine ROSdiabetes  Renal/GU negative Renal ROS  negative genitourinary   Musculoskeletal negative musculoskeletal ROS (+)   Abdominal   Peds negative pediatric ROS (+)  Hematology negative hematology ROS (+)   Anesthesia Other Findings   Reproductive/Obstetrics negative OB ROS                            Anesthesia Physical Anesthesia Plan  ASA: II  Anesthesia Plan: Spinal   Post-op Pain Management:    Induction: Intravenous  Airway Management Planned: Simple Face Mask  Additional Equipment:   Intra-op Plan:   Post-operative Plan:   Informed Consent: I have reviewed the patients History and Physical, chart, labs and discussed the procedure including the risks, benefits and alternatives for the proposed anesthesia with the patient or authorized representative who has indicated his/her understanding and acceptance.   Dental advisory given  Plan Discussed with: CRNA  Anesthesia Plan Comments:         Anesthesia Quick Evaluation

## 2015-10-31 NOTE — Anesthesia Postprocedure Evaluation (Signed)
Anesthesia Post Note  Patient: Emma Wagner  Procedure(s) Performed: Procedure(s) (LRB): TOTAL HIP ARTHROPLASTY ANTERIOR APPROACH (Left)  Patient location during evaluation: PACU Anesthesia Type: Spinal Level of consciousness: oriented and awake and alert Pain management: pain level controlled Vital Signs Assessment: post-procedure vital signs reviewed and stable Respiratory status: spontaneous breathing, respiratory function stable and patient connected to nasal cannula oxygen Cardiovascular status: blood pressure returned to baseline and stable Postop Assessment: no headache and no backache Anesthetic complications: no    Last Vitals:  Filed Vitals:   10/31/15 1223 10/31/15 1253  BP: 101/58 93/59  Pulse: 77 76  Temp: 36.4 C 36.7 C  Resp: 14 16    Last Pain:  Filed Vitals:   10/31/15 1253  PainSc: Asleep                 Montez Hageman

## 2015-10-31 NOTE — Transfer of Care (Signed)
Immediate Anesthesia Transfer of Care Note  Patient: Emma Wagner  Procedure(s) Performed: Procedure(s): TOTAL HIP ARTHROPLASTY ANTERIOR APPROACH (Left)  Patient Location: PACU  Anesthesia Type:Spinal and MAC combined with regional for post-op pain  Level of Consciousness: awake, alert , oriented and patient cooperative  Airway & Oxygen Therapy: Patient Spontanous Breathing and Patient connected to face mask oxygen  Post-op Assessment: Report given to RN and Post -op Vital signs reviewed and stable  Post vital signs: Reviewed and stable  Last Vitals:  Filed Vitals:   10/31/15 0556  BP: 125/78  Pulse: 79  Temp: 36.8 C  Resp: 20    Complications: No apparent anesthesia complications

## 2015-10-31 NOTE — Discharge Instructions (Signed)

## 2015-10-31 NOTE — Progress Notes (Signed)
Orthopedic Tech Progress Note Patient Details:  Emma Wagner 1970/10/09 [id number]  Ortho Devices Ortho Device/Splint Location: trapeze bar patient helper Ortho Device/Splint Interventions: Application   Hildred Priest 10/31/2015, 11:55 AM

## 2015-10-31 NOTE — Interval H&P Note (Signed)
History and Physical Interval Note:  10/31/2015 7:11 AM  Emma Wagner  has presented today for surgery, with the diagnosis of LEFT HIP OSTEOARTHRITIS  The various methods of treatment have been discussed with the patient and family. After consideration of risks, benefits and other options for treatment, the patient has consented to  Procedure(s): TOTAL HIP ARTHROPLASTY ANTERIOR APPROACH (Left) as a surgical intervention .  The patient's history has been reviewed, patient examined, no change in status, stable for surgery.  I have reviewed the patient's chart and labs.  Questions were answered to the patient's satisfaction.     Kerin Salen

## 2015-10-31 NOTE — Progress Notes (Signed)
Orthopedic Tech Progress Note Patient Details:  Emma Wagner Jan 19, 1971 [id number]  Patient ID: Emma Wagner, female   DOB: [birth year], 45 y.o.   MRN: [id number] Viewed order from doctor's order list  Hildred Priest 10/31/2015, 11:55 AM

## 2015-11-01 ENCOUNTER — Encounter (HOSPITAL_COMMUNITY): Payer: Self-pay | Admitting: Orthopedic Surgery

## 2015-11-01 LAB — BASIC METABOLIC PANEL
Anion gap: 8 (ref 5–15)
BUN: 7 mg/dL (ref 6–20)
CALCIUM: 8.4 mg/dL — AB (ref 8.9–10.3)
CO2: 25 mmol/L (ref 22–32)
CREATININE: 0.86 mg/dL (ref 0.44–1.00)
Chloride: 106 mmol/L (ref 101–111)
Glucose, Bld: 183 mg/dL — ABNORMAL HIGH (ref 65–99)
Potassium: 4.1 mmol/L (ref 3.5–5.1)
SODIUM: 139 mmol/L (ref 135–145)

## 2015-11-01 LAB — CBC
HEMATOCRIT: 23.2 % — AB (ref 36.0–46.0)
HEMOGLOBIN: 7.6 g/dL — AB (ref 12.0–15.0)
MCH: 30.2 pg (ref 26.0–34.0)
MCHC: 32.8 g/dL (ref 30.0–36.0)
MCV: 92.1 fL (ref 78.0–100.0)
PLATELETS: 159 10*3/uL (ref 150–400)
RBC: 2.52 MIL/uL — AB (ref 3.87–5.11)
RDW: 14 % (ref 11.5–15.5)
WBC: 13 10*3/uL — AB (ref 4.0–10.5)

## 2015-11-01 LAB — PREPARE RBC (CROSSMATCH)

## 2015-11-01 MED ORDER — SODIUM CHLORIDE 0.9 % IV SOLN
Freq: Once | INTRAVENOUS | Status: AC
  Administered 2015-11-01: 11:00:00 via INTRAVENOUS

## 2015-11-01 MED ORDER — SODIUM CHLORIDE 0.9 % IV SOLN
Freq: Once | INTRAVENOUS | Status: AC
  Administered 2015-11-01: 17:00:00 via INTRAVENOUS

## 2015-11-01 NOTE — Care Management Note (Signed)
Case Management Note  Patient Details  Name: Emma Wagner MRN: [id number] Date of Birth: 06-15-1971  Subjective/Objective:  45 yr old female s/p left total hip arthroplasty.   Action/Plan:  Case manager spoke with patient at bedside concerning home health and DME needs at discharge. Patient hasn't progressedmuch with therapy d/t nausea and lightheadedness. BP is 90/55, Hgb 7.6. Patient receiving fluid presently. Patient was preoperatively setup with [place], no changes to that. She has rolling walker and 3in1. Will have family support at discharge.   Expected Discharge Date:   11/02/15       Expected Discharge Plan:  [place]  In-House Referral:     Discharge planning Services  CM Consult  Post Acute Care Choice:  Home Health Choice offered to:  Patient  DME Arranged:  N/A DME Agency:  NA  HH Arranged:  PT [place] Agency:  [place]  Status of Service:  In process, will continue to follow  Medicare Important Message Given:    Date Medicare IM Given:    Medicare IM give by:    Date Additional Medicare IM Given:    Additional Medicare Important Message give by:     If discussed at [place] of Stay Meetings, dates discussed:    Additional Comments:  Ninfa Meeker, RN 11/01/2015, 11:08 AM

## 2015-11-01 NOTE — Evaluation (Signed)
Physical Therapy Evaluation Patient Details Name: Emma Wagner MRN: [id number] DOB: 1970-09-24 Today's Date: 11/01/2015   History of Present Illness  45 yo admitted for L THA direct anterior approach. PMHx: obesity, GERD  Clinical Impression  Pt pleasant but with very limited mobility stating pain and nausea. Pt was only able to sit EOB briefly and declined all further HEP and mobility other than what is listed below. Pt with decreased strength, function, ROM, activity tolerance, gait and independence who will benefit from acute therapy to maximize mobility, function and decrease burden of care.     Follow Up Recommendations SNF;Supervision for mobility/OOB;Home health PT (if pain able to be controlled and mobility improved hopeful for home. However, in pt current state SNF most appropriate)    Equipment Recommendations  Rolling walker with 5" wheels;3in1 (PT)    Recommendations for Other Services OT consult     Precautions / Restrictions Precautions Precautions: Fall Restrictions LLE Weight Bearing: Weight bearing as tolerated      Mobility  Bed Mobility Overal bed mobility: Needs Assistance Bed Mobility: Supine to Sit;Sit to Supine     Supine to sit: Min assist Sit to supine: Min assist   General bed mobility comments: increased time with pt controlling speed and sequence of all movement despite cues, heavy reliance on upper body with rails and trapeze, HOB 45degrees. Pt able to sit EOB grossly 3 min before return to supine stating dizziness, nausea and pain. Pt was not diaphoretic, pale, or vomiting.   Transfers Overall transfer level:  (pt declined attempting)                  Ambulation/Gait                Stairs            Wheelchair Mobility    Modified Rankin (Stroke Patients Only)       Balance                                             Pertinent Vitals/Pain Pain Assessment: 0-10 Pain Score: 6  Pain Location:  LLE at rest, 10/10 with all movement Pain Descriptors / Indicators: Aching;Stabbing Pain Intervention(s): Limited activity within patient's tolerance;Premedicated before session;Repositioned    Home Living Family/patient expects to be discharged to:: Private residence Living Arrangements: Spouse/significant other;Children Available Help at Discharge: Family;Available 24 hours/day Type of Home: House Home Access: Level entry     Home Layout: Two level;Bed/bath upstairs Home Equipment: None      Prior Function Level of Independence: Independent               Hand Dominance        Extremity/Trunk Assessment   Upper Extremity Assessment: Overall WFL for tasks assessed           Lower Extremity Assessment: LLE deficits/detail   LLE Deficits / Details: excessive weakness and decreased ROM due to pain post op  Cervical / Trunk Assessment: Normal  Communication   Communication: No difficulties  Cognition Arousal/Alertness: Awake/alert Behavior During Therapy: WFL for tasks assessed/performed Overall Cognitive Status: Within Functional Limits for tasks assessed                      General Comments      Exercises Total Joint Exercises Quad Sets: AROM;Left;Supine (2 reps) Heel Slides: AAROM;Left;5  reps;Supine Hip ABduction/ADduction: AAROM;Left;Supine (3 reps with limited ROM)      Assessment/Plan    PT Assessment Patient needs continued PT services  PT Diagnosis Difficulty walking;Generalized weakness;Acute pain   PT Problem List Decreased strength;Decreased range of motion;Decreased activity tolerance;Decreased balance;Decreased mobility;Pain;Decreased knowledge of use of DME  PT Treatment Interventions DME instruction;Gait training;Stair training;Functional mobility training;Therapeutic activities;Therapeutic exercise;Balance training;Patient/family education   PT Goals (Current goals can be found in the Care Plan section) Acute Rehab PT  Goals Patient Stated Goal: return to exercising PT Goal Formulation: With patient Time For Goal Achievement: 11/08/15 Potential to Achieve Goals: Fair    Frequency 7X/week   Barriers to discharge Decreased caregiver support spouse available for 1 week    Co-evaluation               End of Session Equipment Utilized During Treatment: Gait belt Activity Tolerance: Patient limited by pain Patient left: in bed;with call bell/phone within reach Nurse Communication: Mobility status;Weight bearing status         Time: [id number] PT Time Calculation (min) (ACUTE ONLY): 28 min   Charges:   PT Evaluation $PT Eval Moderate Complexity: 1 Procedure     PT G CodesMelford Aase 11/01/2015, 8:41 AM Elwyn Reach, [employer]

## 2015-11-01 NOTE — Progress Notes (Signed)
Patient ID: Emma Wagner, female   DOB: 12/21/1970, 45 y.o.   MRN: [id number] PATIENT ID: Emma Wagner  MRN: [id number]  DOB/AGE:  1970/10/15 / 45 y.o.  1 Day Post-Op Procedure(s) (LRB): TOTAL HIP ARTHROPLASTY ANTERIOR APPROACH (Left)    PROGRESS NOTE Subjective: Patient is alert, oriented, x2 Nausea, x2 Vomiting, yes passing gas, . Taking PO well. Denies SOB, Chest or Calf Pain. Using Incentive Spirometer, PAS in place. Ambulate WBAT Patient reports pain as  8/10  .    Objective: Vital signs in last 24 hours: Filed Vitals:   10/31/15 2106 10/31/15 2109 11/01/15 0100 11/01/15 0535  BP: 88/53 88/52 96/63  90/55  Pulse: 65  70 74  Temp: 98.2 F (36.8 C)  98.6 F (37 C) 98.6 F (37 C)  TempSrc: Oral  Oral Oral  Resp: 12  15 16   Weight:      SpO2: 100%  100% 96%      Intake/Output from previous day: I/O last 3 completed shifts: In: 3800 [I.V.:3300; IV Piggyback:500] Out: 2575 [Urine:1450; Emesis/NG output:175; Blood:950]   Intake/Output this shift:     LABORATORY DATA:  Recent Labs  11/01/15 0557  WBC 13.0*  HGB 7.6*  HCT 23.2*  PLT 159  NA 139  K 4.1  CL 106  CO2 25  BUN 7  CREATININE 0.86  GLUCOSE 183*  CALCIUM 8.4*    Examination: Neurologically intact ABD soft Neurovascular intact Sensation intact distally Intact pulses distally Dorsiflexion/Plantar flexion intact Incision: dressing C/D/I No cellulitis present Compartment soft} XR AP&Lat of hip shows well placed\fixed THA  Assessment:   1 Day Post-Op Procedure(s) (LRB): TOTAL HIP ARTHROPLASTY ANTERIOR APPROACH (Left) ADDITIONAL DIAGNOSIS:  Expected Acute Blood Loss Anemia,   Plan: PT/OT WBAT, THA, Bolus NS today  DVT Prophylaxis: SCDx72 hrs, ASA 325 mg BID x 2 weeks  DISCHARGE PLAN: Home  DISCHARGE NEEDS: HHPT, Walker and 3-in-1 comode seat

## 2015-11-01 NOTE — Progress Notes (Signed)
Patient got up OOB twice so far this shift with walker and 2 assist.  She did very well even though she experienced some dizziness and n/v.  Patient was due to void but had large bladder incontinent episode with emesis event.  Patient is currently resting in bed.

## 2015-11-01 NOTE — Progress Notes (Signed)
Physical Therapy Treatment Patient Details Name: Emma Wagner MRN: [id number] DOB: 30-Jul-1971 Today's Date: 11/01/2015    History of Present Illness 45 yo admitted for L THA direct anterior approach. PMHx: obesity, GERD    PT Comments    Pt with improvement from morning session and able to transfer to EOB and OOB to chair. Remains unable to ambulate due to hypotension. Pain more controlled than in AM and able to perform limited HEP. Pt educated for transfers, function and HEP. Pt encouraged to attempt mobility with nursing again after receiving blood. Will continue to follow.   Orthostatic BPs  Supine with HOB elevated 102/55  Sitting 92/48     Standing 80/49        Follow Up Recommendations  Home health PT;Supervision/Assistance - 24 hour     Equipment Recommendations  Rolling walker with 5" wheels;3in1 (PT)    Recommendations for Other Services       Precautions / Restrictions Precautions Precautions: Fall Precaution Comments: watch BP Restrictions LLE Weight Bearing: Weight bearing as tolerated    Mobility  Bed Mobility   Bed Mobility: Supine to Sit     Supine to sit: Supervision;HOB elevated     General bed mobility comments: Pt able to transfer from sitting to EOB on her own with increased time, cues, HOB fully elevated, rail   Transfers Overall transfer level: Needs assistance   Transfers: Sit to/from Stand;Stand Pivot Transfers Sit to Stand: Mod assist;+2 safety/equipment;From elevated surface Stand pivot transfers: Min assist;+2 safety/equipment       General transfer comment: cues for hand placement, sequence and safety with assist for anterior translation and rise from surface. Cues for sequence with pivot with chair pulled to pt due to feeling dizzy  Ambulation/Gait Ambulation/Gait assistance:  (unable due to hypotension)               Stairs            Wheelchair Mobility    Modified Rankin (Stroke Patients Only)        Balance                                    Cognition Arousal/Alertness: Awake/alert Behavior During Therapy: WFL for tasks assessed/performed Overall Cognitive Status: Within Functional Limits for tasks assessed                      Exercises Total Joint Exercises Gluteal Sets: AROM;Seated;Both;10 reps Heel Slides: AAROM;Left;10 reps;Seated Hip ABduction/ADduction: AAROM;Left;10 reps;Seated    General Comments        Pertinent Vitals/Pain Pain Score: 6  Pain Location: LLE with movement Pain Descriptors / Indicators: Aching Pain Intervention(s): Limited activity within patient's tolerance;Premedicated before session;Repositioned    Home Living                      Prior Function            PT Goals (current goals can now be found in the care plan section) Progress towards PT goals: Progressing toward goals (slowly limited by hypotension and pain)    Frequency       PT Plan Current plan remains appropriate    Co-evaluation PT/OT/SLP Co-Evaluation/Treatment: Yes Reason for Co-Treatment: For patient/therapist safety PT goals addressed during session: Mobility/safety with mobility;Proper use of DME;Strengthening/ROM       End of Session Equipment Utilized During Treatment: Gait belt  Activity Tolerance: Patient limited by pain;Treatment limited secondary to medical complications (Comment) Patient left: in chair;with call bell/phone within reach     Time: 1213-1241 PT Time Calculation (min) (ACUTE ONLY): 28 min  Charges:  $Therapeutic Activity: 8-22 mins                    G Codes:      Melford Aase 11/11/15, 1:01 PM Elwyn Reach, [place]

## 2015-11-01 NOTE — Care Management (Signed)
Utilization review completed. Corrine Tillis, RN Case Manager [phone]. 

## 2015-11-01 NOTE — Progress Notes (Signed)
Occupational Therapy Evaluation Patient Details Name: Emma Wagner MRN: [id number] DOB: Jan 05, 1971 Today's Date: 11/01/2015    History of Present Illness 45 yo admitted for L THA direct anterior approach. PMHx: obesity, GERD   Clinical Impression   PTA, pt was independent with ADLs and mobility. Pt currently requires min-mod assist +2 for safety due to hypotension, dizziness, and nausea (see PT note for orthostatic vitals taken this session). Educated pt on safe transfers and proper use of DME. Pt plans to d/c home with 24/7 assistance from her family. Pt will benefit from continued acute OT to increase independence and safety with ADLs and mobility to allow for safe discharge home. Will continue to follow acutely.     Follow Up Recommendations  No OT follow up;Supervision/Assistance - 24 hour    Equipment Recommendations  Tub/shower seat;Other (comment) (Adaptive equipment - pt to purchase if needed)    Recommendations for Other Services       Precautions / Restrictions Precautions Precautions: Fall Precaution Comments: watch BP Restrictions Weight Bearing Restrictions: Yes LLE Weight Bearing: Weight bearing as tolerated      Mobility Bed Mobility Overal bed mobility: Needs Assistance Bed Mobility: Supine to Sit     Supine to sit: Supervision;HOB elevated     General bed mobility comments: Pt able to move LLE without physical assistance and just increased time and effort. Pt relying heavily on bedrails and bed pad to scoot to EOB.  Transfers Overall transfer level: Needs assistance Equipment used: Rolling walker (2 wheeled) Transfers: Sit to/from [facility] Sit to Stand: Mod assist;+2 safety/equipment;From elevated surface Stand pivot transfers: Min assist;+2 safety/equipment       General transfer comment: Mod assist for boost to stand, to stabilize RW, and to help pt stabilize balance upon standing. Verbal cues for safe hand placement,  sequencing and safety throughout both transfers. Pt reported increased dizziness while completing stand-pivot transfer.    Balance Overall balance assessment: Needs assistance Sitting-balance support: No upper extremity supported;Feet supported Sitting balance-Leahy Scale: Fair     Standing balance support: Bilateral upper extremity supported;During functional activity Standing balance-Leahy Scale: Poor                              ADL Overall ADL's : Needs assistance/impaired                 Upper Body Dressing : Set up;Sitting   Lower Body Dressing: Minimal assistance;Sit to/from stand   Toilet Transfer: Minimal assistance;+2 for safety/equipment;Stand-pivot;RW;Cueing for safety Toilet Transfer Details (indicate cue type and reason): Simulated to reclincer; verbal cues for safe hand placement Toileting- Clothing Manipulation and Hygiene: Minimal assistance;Sit to/from stand       Functional mobility during ADLs: Minimal assistance;+2 for safety/equipment;Rolling walker General ADL Comments: Pt with presyncope episodes earlier today and yesterday. Orthostatic BP values taken - see PT note from same date for values.     Vision Vision Assessment?: No apparent visual deficits Additional Comments: Reports of severe dizziness due to low hemoglobin and low BP   Perception     Praxis      Pertinent Vitals/Pain Pain Assessment: 0-10 Pain Score: 6  Pain Location: LLE with movement Pain Descriptors / Indicators: Aching;Grimacing;Guarding Pain Intervention(s): Limited activity within patient's tolerance;Monitored during session;Premedicated before session;Repositioned     Hand Dominance Right   Extremity/Trunk Assessment Upper Extremity Assessment Upper Extremity Assessment: Overall WFL for tasks assessed   Lower Extremity Assessment  Lower Extremity Assessment: LLE deficits/detail LLE Deficits / Details: excessive weakness and decreased ROM due to pain  post op   Cervical / Trunk Assessment Cervical / Trunk Assessment: Normal   Communication Communication Communication: No difficulties   Cognition Arousal/Alertness: Awake/alert Behavior During Therapy: WFL for tasks assessed/performed Overall Cognitive Status: Within Functional Limits for tasks assessed                     General Comments       Exercises       Shoulder Instructions      Home Living Family/patient expects to be discharged to:: Private residence Living Arrangements: Spouse/significant other;Children Available Help at Discharge: Family;Available 24 hours/day Type of Home: House Home Access: Level entry     Home Layout: Two level;Bed/bath upstairs     Bathroom Shower/Tub: Walk-in shower;Door   [employer] Toilet: Standard     Home Equipment: None          Prior Functioning/Environment Level of Independence: Independent             OT Diagnosis: Acute pain   OT Problem List: Decreased strength;Decreased range of motion;Decreased activity tolerance;Impaired balance (sitting and/or standing);Decreased coordination;Decreased safety awareness;Decreased knowledge of use of DME or AE;Decreased knowledge of precautions;Pain   OT Treatment/Interventions: Self-care/ADL training;Therapeutic exercise;Energy conservation;DME and/or AE instruction;Therapeutic activities;Patient/family education;Balance training    OT Goals(Current goals can be found in the care plan section) Acute Rehab OT Goals Patient Stated Goal: to continue to lose weight OT Goal Formulation: With patient Time For Goal Achievement: 11/15/15 Potential to Achieve Goals: Good ADL Goals Pt Will Perform Grooming: with supervision;standing Pt Will Perform Lower Body Bathing: with supervision;with adaptive equipment;sitting/lateral leans;sit to/from stand Pt Will Perform Lower Body Dressing: with supervision;with adaptive equipment;sitting/lateral leans;sit to/from stand Pt Will  Transfer to Toilet: with supervision;ambulating;bedside commode (with BSC over toilet) Pt Will Perform Toileting - Clothing Manipulation and hygiene: with supervision;sit to/from stand;sitting/lateral leans Pt Will Perform Tub/Shower Transfer: Shower transfer;with supervision;ambulating;shower seat;rolling walker  OT Frequency: Min 2X/week   Barriers to D/C:            Co-evaluation PT/OT/SLP Co-Evaluation/Treatment: Yes Reason for Co-Treatment: For patient/therapist safety PT goals addressed during session: Mobility/safety with mobility;Proper use of DME;Strengthening/ROM OT goals addressed during session: ADL's and self-care;Proper use of Adaptive equipment and DME      End of Session Equipment Utilized During Treatment: Gait belt;Rolling walker Nurse Communication: Mobility status;Other (comment) (orthostatic BP values)  Activity Tolerance: Treatment limited secondary to medical complications (Comment) (dizziness, low BP and hemoglobin) Patient left: in chair;with call bell/phone within reach   Time: 1212-1232 OT Time Calculation (min): 20 min Charges:  OT General Charges $OT Visit: 1 Procedure OT Evaluation $OT Eval Moderate Complexity: 1 Procedure G-Codes:    Redmond Baseman, OTR/L [phone] 11/01/2015, 1:26 PM

## 2015-11-02 LAB — CBC
HCT: 28.8 % — ABNORMAL LOW (ref 36.0–46.0)
Hemoglobin: 9.8 g/dL — ABNORMAL LOW (ref 12.0–15.0)
MCH: 30.8 pg (ref 26.0–34.0)
MCHC: 34 g/dL (ref 30.0–36.0)
MCV: 90.6 fL (ref 78.0–100.0)
PLATELETS: 169 10*3/uL (ref 150–400)
RBC: 3.18 MIL/uL — ABNORMAL LOW (ref 3.87–5.11)
RDW: 15 % (ref 11.5–15.5)
WBC: 16.1 10*3/uL — ABNORMAL HIGH (ref 4.0–10.5)

## 2015-11-02 LAB — TYPE AND SCREEN
ABO/RH(D): A POS
ANTIBODY SCREEN: NEGATIVE
UNIT DIVISION: 0
Unit division: 0

## 2015-11-02 NOTE — Progress Notes (Signed)
Physical Therapy Treatment Patient Details Name: Emma Wagner MRN: [id number] DOB: [birth year] Today's Date: 11/02/2015    History of Present Illness 45 yo admitted for L THA direct anterior approach. PMHx: obesity, GERD    PT Comments    Patient is progressing well toward mobility. Review HEP next session. Stair training complete. Continue to progress as tolerated with anticipated d/c home.   Follow Up Recommendations  Home health PT;Supervision/Assistance - 24 hour     Equipment Recommendations  Rolling walker with 5" wheels;3in1 (PT)    Recommendations for Other Services OT consult     Precautions / Restrictions Precautions Precautions: Fall Precaution Comments: watch BP Restrictions Weight Bearing Restrictions: Yes LLE Weight Bearing: Weight bearing as tolerated    Mobility  Bed Mobility Overal bed mobility: Needs Assistance Bed Mobility: Supine to Sit     Supine to sit: Supervision     General bed mobility comments: BP 110/69 in sitting; HOB flat and no use of bedrails; cued for technique with use of R LE to lower L LE from EOB; increaed time needed  Transfers Overall transfer level: Needs assistance Equipment used: Rolling walker (2 wheeled) Transfers: Sit to/from Stand Sit to Stand: Min guard         General transfer comment: min guard for safety; cues for hand placement  Ambulation/Gait Ambulation/Gait assistance: Supervision Ambulation Distance (Feet): 100 Feet Assistive device: Rolling walker (2 wheeled) Gait Pattern/deviations: Step-through pattern;Decreased stride length;Antalgic     General Gait Details: cues for sequencing, position of RW, and encouraged increase WB on L LE   Stairs Stairs: Yes Stairs assistance: Min guard Stair Management: No rails;Backwards;With walker Number of Stairs: 5 General stair comments: educated on technique and sequencing; no unsteadiness noted  Wheelchair Mobility    Modified Rankin (Stroke Patients  Only)       Balance Overall balance assessment: Needs assistance Sitting-balance support: No upper extremity supported;Feet supported Sitting balance-Leahy Scale: Good     Standing balance support: Bilateral upper extremity supported Standing balance-Leahy Scale: Fair                      Cognition Arousal/Alertness: Awake/alert Behavior During Therapy: WFL for tasks assessed/performed Overall Cognitive Status: Within Functional Limits for tasks assessed                      Exercises      General Comments        Pertinent Vitals/Pain Pain Assessment: Faces Faces Pain Scale: Hurts little more Pain Location: L Hip Pain Descriptors / Indicators: Tightness;Sore Pain Intervention(s): Limited activity within patient's tolerance;Monitored during session;Premedicated before session;Repositioned    Home Living                      Prior Function            PT Goals (current goals can now be found in the care plan section) Acute Rehab PT Goals Patient Stated Goal: to continue to lose weight Progress towards PT goals: Progressing toward goals    Frequency  7X/week    PT Plan Current plan remains appropriate    Co-evaluation             End of Session Equipment Utilized During Treatment: Gait belt Activity Tolerance: Patient tolerated treatment well Patient left: in chair;with call bell/phone within reach     Time: 1009-1037 PT Time Calculation (min) (ACUTE ONLY): 28 min  Charges:  $Gait Training: 8-22  mins $Therapeutic Activity: 8-22 mins                    G Codes:      Salina April, PTA Pager: [phone]   11/02/2015, 11:58 AM

## 2015-11-02 NOTE — Progress Notes (Signed)
Occupational Therapy Treatment Patient Details Name: Emma Wagner MRN: [id number] DOB: 28-Nov-1970 Today's Date: 11/02/2015    History of present illness 45 yo admitted for L THA direct anterior approach. PMHx: obesity, GERD   OT comments  Pt making progress toward OT goals. Educated pt on use of AE for increased independence with LB ADLs; pt able to return demo use. Discussed shower chair for safety with bathing; pt wants to purchase one (CM notified). D/c plan remains appropriate. Pt ready to d/c from and OT standpoint but will continue to follow acutely.    Follow Up Recommendations  No OT follow up;Supervision/Assistance - 24 hour    Equipment Recommendations  Tub/shower seat;Other (comment) (AE)    Recommendations for Other Services      Precautions / Restrictions Precautions Precautions: Fall Precaution Comments: watch BP Restrictions Weight Bearing Restrictions: Yes LLE Weight Bearing: Weight bearing as tolerated       Mobility Bed Mobility      General bed mobility comments: Pt OOB in chair upon arrival.  Transfers          General transfer comment: Not assessed at this time.    Balance                      ADL Overall ADL's : Needs assistance/impaired               Lower Body Bathing Details (indicate cue type and reason): Educated on use of long handled sponge for increased independence with LB bathing,     Lower Body Dressing: Min guard;Sit to/from stand;With adaptive equipment Lower Body Dressing Details (indicate cue type and reason): Educate on use of long handled shoe horn, reacher, and sock aide; pt able to return demo use of reacher and sock aide. Educated on L leg into clothing first.           Clinical cytogeneticist Details (indicate cue type and reason): Discussed use of shower chair for safety with bathing; pt agreeable and wants to purchase chair; CM notified.   General ADL Comments: No family present for OT session. Pt  declined to practice transfers at this time; states that she just feels ready to go home. Educated on home safety, need for supervision with shower transfers; pt verbalized understanding.       Vision                     Perception     Praxis      Cognition   Behavior During Therapy: WFL for tasks assessed/performed Overall Cognitive Status: Within Functional Limits for tasks assessed                       Extremity/Trunk Assessment               Exercises    Shoulder Instructions       General Comments      Pertinent Vitals/ Pain       Pain Assessment: Faces Faces Pain Scale: Hurts a little bit Pain Location: L hip Pain Descriptors / Indicators: Discomfort;Sore Pain Intervention(s): Limited activity within patient's tolerance;Monitored during session  Home Living                                          Prior Functioning/Environment  Frequency Min 2X/week     Progress Toward Goals  OT Goals(current goals can now be found in the care plan section)  Progress towards OT goals: Progressing toward goals  Acute Rehab OT Goals Patient Stated Goal: to continue to lose weight OT Goal Formulation: With patient  Plan Discharge plan remains appropriate    Co-evaluation                 End of Session Equipment Utilized During Treatment: Other (comment) (AE)   Activity Tolerance Patient tolerated treatment well   Patient Left in chair;with call bell/phone within reach   Nurse Communication          Time: [id number] OT Time Calculation (min): 14 min  Charges: OT General Charges $OT Visit: 1 Procedure OT Treatments $Self Care/Home Management : 8-22 mins  Binnie Kand M.S., OTR/L Pager: [phone]  11/02/2015, 3:24 PM

## 2015-11-02 NOTE — Discharge Summary (Signed)
Patient ID: Emma Wagner MRN: [id number] DOB/AGE: [birth year] 45 y.o.  Admit date: 10/31/2015 Discharge date: 11/02/2015  Admission Diagnoses:  Principal Problem:   Primary osteoarthritis of right hip Active Problems:   Primary osteoarthritis of left hip   Discharge Diagnoses:  Same  Past Medical History  Diagnosis Date  . Bursitis     bilateral hips, more on right hip  . GERD (gastroesophageal reflux disease)   . Hemorrhoids   . Arthritis   . Frequent urination     while kidney stone evident  . PONV (postoperative nausea and vomiting)     nausea with appendectomy  . Anxiety     add also  . Depression   . Chronic kidney disease     h/o kidney stone 2016  . Headache     "every blue moon. not often"  . Borderline type 2 diabetes mellitus since 2013    -lost weight and quit smoking and is no longer PRE diabetic    Surgeries: Procedure(s): TOTAL HIP ARTHROPLASTY ANTERIOR APPROACH on 10/31/2015   Consultants:    Discharged Condition: Improved  Hospital Course: Emma Wagner is an 45 y.o. female who was admitted 10/31/2015 for operative treatment ofPrimary osteoarthritis of right hip. Patient has severe unremitting pain that affects sleep, daily activities, and work/hobbies. After pre-op clearance the patient was taken to the operating room on 10/31/2015 and underwent  Procedure(s): TOTAL HIP ARTHROPLASTY ANTERIOR APPROACH.    Patient was given perioperative antibiotics: Anti-infectives    Start     Dose/Rate Route Frequency Ordered Stop   10/31/15 0700  ceFAZolin (ANCEF) 3 g in dextrose 5 % 50 mL IVPB     3 g 130 mL/hr over 30 Minutes Intravenous To ShortStay Surgical 10/28/15 1156 10/31/15 0748       Patient was given sequential compression devices, early ambulation, and chemoprophylaxis to prevent DVT.  Patient benefited maximally from hospital stay and there were no complications.    Recent vital signs: Patient Vitals for the past 24 hrs:  BP Temp Temp src  Pulse Resp SpO2  11/02/15 0500 108/66 mmHg 98.7 F (37.1 C) - 78 16 100 %  11/02/15 0027 (!) 97/54 mmHg 98.5 F (36.9 C) Oral 74 16 98 %  11/01/15 2226 104/63 mmHg 98.6 F (37 C) Oral 82 18 98 %  11/01/15 2200 (!) 99/51 mmHg 98.9 F (37.2 C) Oral 84 17 98 %  11/01/15 1944 101/61 mmHg 99.2 F (37.3 C) Oral 91 17 99 %  11/01/15 1856 109/68 mmHg 98.5 F (36.9 C) Oral 81 17 97 %  11/01/15 1715 (!) 102/59 mmHg 98.6 F (37 C) Oral 80 16 99 %  11/01/15 1645 99/62 mmHg 98.4 F (36.9 C) Oral 75 17 95 %     Recent laboratory studies:  Recent Labs  11/01/15 0557 11/02/15 0607  WBC 13.0* 16.1*  HGB 7.6* 9.8*  HCT 23.2* 28.8*  PLT 159 169  NA 139  --   K 4.1  --   CL 106  --   CO2 25  --   BUN 7  --   CREATININE 0.86  --   GLUCOSE 183*  --   CALCIUM 8.4*  --      Discharge Medications:     Medication List    STOP taking these medications        diphenhydramine-acetaminophen 25-500 MG Tabs tablet  Commonly known as:  TYLENOL PM     ibuprofen 600 MG tablet  Commonly known as:  ADVIL,MOTRIN     meloxicam 15 MG tablet  Commonly known as:  MOBIC      TAKE these medications        amphetamine-dextroamphetamine 20 MG tablet  Commonly known as:  ADDERALL  Take 10 mg by mouth daily.     aspirin EC 325 MG tablet  Take 1 tablet (325 mg total) by mouth 2 (two) times daily.     BENEFIBER PO  Take 30 mLs by mouth daily.     buPROPion 150 MG 24 hr tablet  Commonly known as:  WELLBUTRIN XL  Take 150 mg by mouth daily.     cyanocobalamin 1000 MCG tablet  Take 1,000 mcg by mouth daily.     diazepam 5 MG tablet  Commonly known as:  VALIUM  Take 5 mg by mouth 3 (three) times daily as needed for anxiety.     MAGNESIUM CITRATE PO  Take 4 capsules by mouth at bedtime.     methocarbamol 500 MG tablet  Commonly known as:  ROBAXIN  Take 1 tablet (500 mg total) by mouth 2 (two) times daily with a meal.     omeprazole 20 MG capsule  Commonly known as:  PRILOSEC  Take 20  mg by mouth daily.     OVER THE COUNTER MEDICATION  Take 5 capsules by mouth 3 (three) times daily. OTC. Intestinal Formula     oxyCODONE-acetaminophen 5-325 MG tablet  Commonly known as:  ROXICET  Take 1 tablet by mouth every 4 (four) hours as needed.     PROZAC 20 MG capsule  Generic drug:  FLUoxetine  Take 20 mg by mouth daily.     pyridOXINE 100 MG tablet  Commonly known as:  VITAMIN B-6  Take 100 mg by mouth daily.        Diagnostic Studies: Dg Chest 2 View  10/21/2015  CLINICAL DATA:  Preoperative exam prior to hip joint replacement, former smoker. EXAM: CHEST  2 VIEW COMPARISON:  PA and lateral chest x-ray of September 17, 2011. FINDINGS: The lungs are adequately inflated. There is no focal infiltrate. The lung markings are coarse bilaterally consistent with the patient's smoking history. No pulmonary parenchymal nodules or masses are observed. The heart and pulmonary vascularity are normal. The mediastinum is normal in width. There is no pleural effusion. There is mild multilevel degenerative disc disease of the mid and lower thoracic spine. IMPRESSION: Mild chronic bronchitic changes. There is no acute cardiopulmonary abnormality. Electronically Signed   By: David  Martinique M.D.   On: 10/21/2015 13:35   Dg Hip Operative Unilat W Or W/o Pelvis Left  10/31/2015  CLINICAL DATA:  Anterior approach left total hip arthroplasty for osteoarthritis. EXAM: OPERATIVE LEFT HIP (WITH PELVIS IF PERFORMED) 1 VIEW TECHNIQUE: Fluoroscopic spot image(s) were submitted for interpretation post-operatively. COMPARISON:  None. FINDINGS: AP spot image with the C-arm fluoroscopic device demonstrates anatomic alignment of the left hip arthroplasty. No visible complicating features. IMPRESSION: Anatomic alignment in the AP projection post left total hip arthroplasty. Electronically Signed   By: Evangeline Dakin M.D.   On: 10/31/2015 09:44    Disposition: 01-Home or Self Care      Discharge Instructions     Call MD / Call 911    Complete by:  As directed   If you experience chest pain or shortness of breath, CALL 911 and be transported to the hospital emergency room.  If you develope a fever above 101 F, pus (white drainage) or increased drainage or  redness at the wound, or calf pain, call your surgeon's office.     Change dressing    Complete by:  As directed   You may change your dressing on day 5, then change the dressing daily with sterile 4 x 4 inch gauze dressing and paper tape.  You may clean the incision with alcohol prior to redressing     Constipation Prevention    Complete by:  As directed   Drink plenty of fluids.  Prune juice may be helpful.  You may use a stool softener, such as Colace (over the counter) 100 mg twice a day.  Use MiraLax (over the counter) for constipation as needed.     Diet - low sodium heart healthy    Complete by:  As directed      Discharge wound care:    Complete by:  As directed   If you have a hip bandage, keep it clean and dry.  Change your bandage as instructed by your health care providers.  If your bandage has been discontinued, keep your incision clean and dry.  Pat dry after bathing.  DO NOT put lotion or powder on your incision.     Driving restrictions    Complete by:  As directed   No driving for 2 weeks     Follow the hip precautions as taught in Physical Therapy    Complete by:  As directed      Increase activity slowly as tolerated    Complete by:  As directed      Patient may shower    Complete by:  As directed   You may shower without a dressing once there is no drainage.  Do not wash over the wound.  If drainage remains, cover wound with plastic wrap and then shower.           Follow-up Information    Follow up with Kerin Salen, MD In 2 weeks.   Specialty:  Orthopedic Surgery   Contact information:   [place] [address] [phone]        Signed: Theodosia Quay 11/02/2015, 2:44 PM

## 2015-11-02 NOTE — Progress Notes (Signed)
PATIENT ID: Emma Wagner  MRN: [id number]  DOB/AGE:  45-Nov-1972 / 45 y.o.  2 Days Post-Op Procedure(s) (LRB): TOTAL HIP ARTHROPLASTY ANTERIOR APPROACH (Left)    PROGRESS NOTE Subjective: Patient is alert, oriented, no Nausea, no Vomiting, yes passing gas, . Taking PO well. Denies SOB, Chest or Calf Pain. Using Incentive Spirometer, PAS in place. Ambulate WBAT with pt having weakness and dizziness yesterday limiting therapy. Patient reports pain as  moderate  .    Objective: Vital signs in last 24 hours: Filed Vitals:   11/01/15 2200 11/01/15 2226 11/02/15 0027 11/02/15 0500  BP: 99/51 104/63 97/54 108/66  Pulse: 84 82 74 78  Temp: 98.9 F (37.2 C) 98.6 F (37 C) 98.5 F (36.9 C) 98.7 F (37.1 C)  TempSrc: Oral Oral Oral   Resp: 17 18 16 16   Weight:      SpO2: 98% 98% 98% 100%      Intake/Output from previous day: I/O last 3 completed shifts: In: 6217.1 [P.O.:480; I.V.:5108.3; Blood:628.8] Out: 1925 [Urine:1750; Emesis/NG output:175]   Intake/Output this shift:     LABORATORY DATA:  Recent Labs  11/01/15 0557 11/02/15 0607  WBC 13.0* 16.1*  HGB 7.6* 9.8*  HCT 23.2* 28.8*  PLT 159 169  NA 139  --   K 4.1  --   CL 106  --   CO2 25  --   BUN 7  --   CREATININE 0.86  --   GLUCOSE 183*  --   CALCIUM 8.4*  --     Examination: Neurologically intact Neurovascular intact Sensation intact distally Intact pulses distally Dorsiflexion/Plantar flexion intact Incision: dressing C/D/I No cellulitis present Compartment soft} XR AP&Lat of hip shows well placed\fixed THA  Assessment:   2 Days Post-Op Procedure(s) (LRB): TOTAL HIP ARTHROPLASTY ANTERIOR APPROACH (Left) ADDITIONAL DIAGNOSIS:  Expected Acute Blood Loss Anemia,   Plan: PT/OT WBAT, THA  DVT Prophylaxis: SCDx72 hrs, ASA 325 mg BID x 2 weeks  DISCHARGE PLAN: Home, when pt passes therapy goals  DISCHARGE NEEDS: HHPT, Walker and 3-in-1 comode seat

## 2015-11-02 NOTE — Progress Notes (Signed)
Pt ready for d/c per MD. Cleared by PT/OT, all equipment has been delivered to room. Discharge prescriptions and instructions reviewed with pt, she denied any questions. She did request a prescription for zofran. Message and call back number was left with receptionist at Dr. Damita Dunnings office, who stated that she may not get the prescription today. Belongings gathered and will be sent with pt. Pt will be assisted to car by NT.  [place], Jerry Caras

## 2015-11-02 NOTE — Progress Notes (Signed)
Physical Therapy Treatment Patient Details Name: Emma Wagner MRN: [id number] DOB: 28-Mar-1971 Today's Date: 11/02/2015    History of Present Illness 45 yo admitted for L THA direct anterior approach. PMHx: obesity, GERD    PT Comments    Patient is making good progress with PT.  From a mobility standpoint anticipate patient will be ready for DC home when medically ready.     Follow Up Recommendations  Home health PT;Supervision/Assistance - 24 hour     Equipment Recommendations  Rolling walker with 5" wheels;3in1 (PT)    Recommendations for Other Services OT consult     Precautions / Restrictions Precautions Precautions: Fall Precaution Comments: watch BP Restrictions Weight Bearing Restrictions: Yes LLE Weight Bearing: Weight bearing as tolerated    Mobility  Bed Mobility Overal bed mobility: Needs Assistance Bed Mobility: Supine to Sit     Supine to sit: Supervision     General bed mobility comments: Pt OOB in chair upon arrival  Transfers Overall transfer level: Needs assistance Equipment used: Rolling walker (2 wheeled) Transfers: Sit to/from Stand Sit to Stand: Min guard         General transfer comment: min guard for safety; cues for hand placement  Ambulation/Gait Ambulation/Gait assistance: Supervision Ambulation Distance (Feet): 100 Feet Assistive device: Rolling walker (2 wheeled) Gait Pattern/deviations: Step-through pattern;Decreased stride length;Antalgic     General Gait Details: cues for sequencing, position of RW, and encouraged increase WB on L LE   Stairs Stairs: Yes Stairs assistance: Min guard Stair Management: No rails;Backwards;With walker Number of Stairs: 5 General stair comments: educated on technique and sequencing; no unsteadiness noted  Wheelchair Mobility    Modified Rankin (Stroke Patients Only)       Balance Overall balance assessment: Needs assistance Sitting-balance support: No upper extremity  supported;Feet supported Sitting balance-Leahy Scale: Good     Standing balance support: Bilateral upper extremity supported Standing balance-Leahy Scale: Fair                      Cognition Arousal/Alertness: Awake/alert Behavior During Therapy: WFL for tasks assessed/performed Overall Cognitive Status: Within Functional Limits for tasks assessed                      Exercises Total Joint Exercises Ankle Circles/Pumps: AROM;Both;10 reps;Seated Quad Sets: AROM;Both;10 reps;Seated Heel Slides: AROM;Left;10 reps;Seated Hip ABduction/ADduction: AROM;Left;10 reps;Seated Straight Leg Raises: AAROM;Left;5 reps;Seated Long Arc Quad: AAROM;Left;10 reps;Seated    General Comments General comments (skin integrity, edema, etc.): reviewed HEP and handout, use of ice, precautions, and positioning      Pertinent Vitals/Pain Pain Assessment: Faces Faces Pain Scale: Hurts a little bit Pain Location: L hip Pain Descriptors / Indicators: Discomfort;Sore;Tightness Pain Intervention(s): Limited activity within patient's tolerance;Monitored during session;Premedicated before session;Repositioned    Home Living                      Prior Function            PT Goals (current goals can now be found in the care plan section) Acute Rehab PT Goals Patient Stated Goal: to continue to lose weight Progress towards PT goals: Progressing toward goals    Frequency  7X/week    PT Plan Current plan remains appropriate    Co-evaluation             End of Session Equipment Utilized During Treatment: Gait belt Activity Tolerance: Patient tolerated treatment well Patient left: in chair;with call  bell/phone within reach     Time: 1428-1450 PT Time Calculation (min) (ACUTE ONLY): 22 min  Charges:   $Therapeutic Exercise: 8-22 mins                     G Codes:      [place] April, PTA Pager: [phone]   11/02/2015, 3:16 PM

## 2016-04-20 IMAGING — CR DG CHEST 2V
2 series · 2 of 2 positions shown · non-contrast
Comparison: PA and lateral chest x-ray September 17, 2011.

CLINICAL DATA: Preoperative exam prior to hip joint replacement,
former smoker.

EXAM:
CHEST  2 VIEW

[w chest pa]
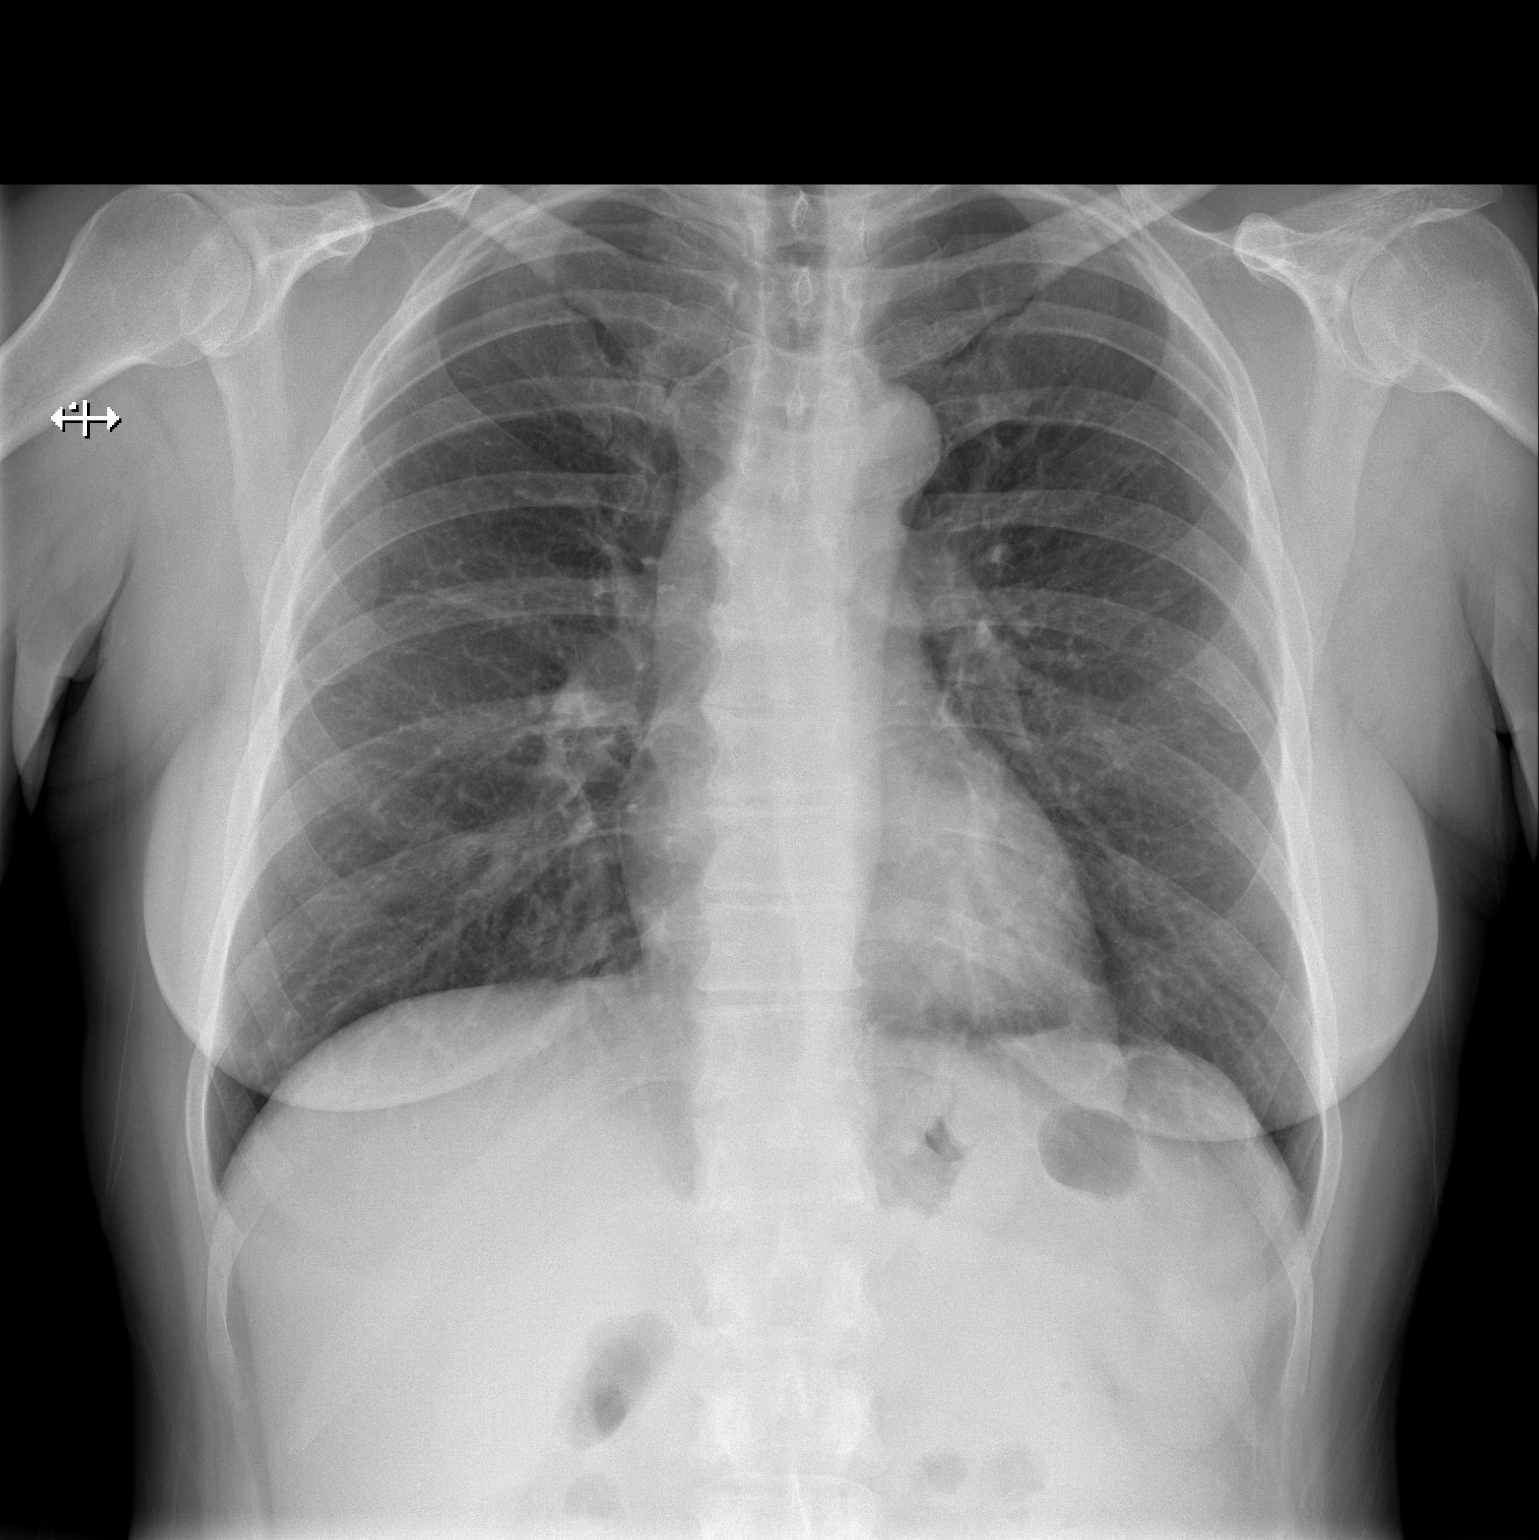

[w chest lat]
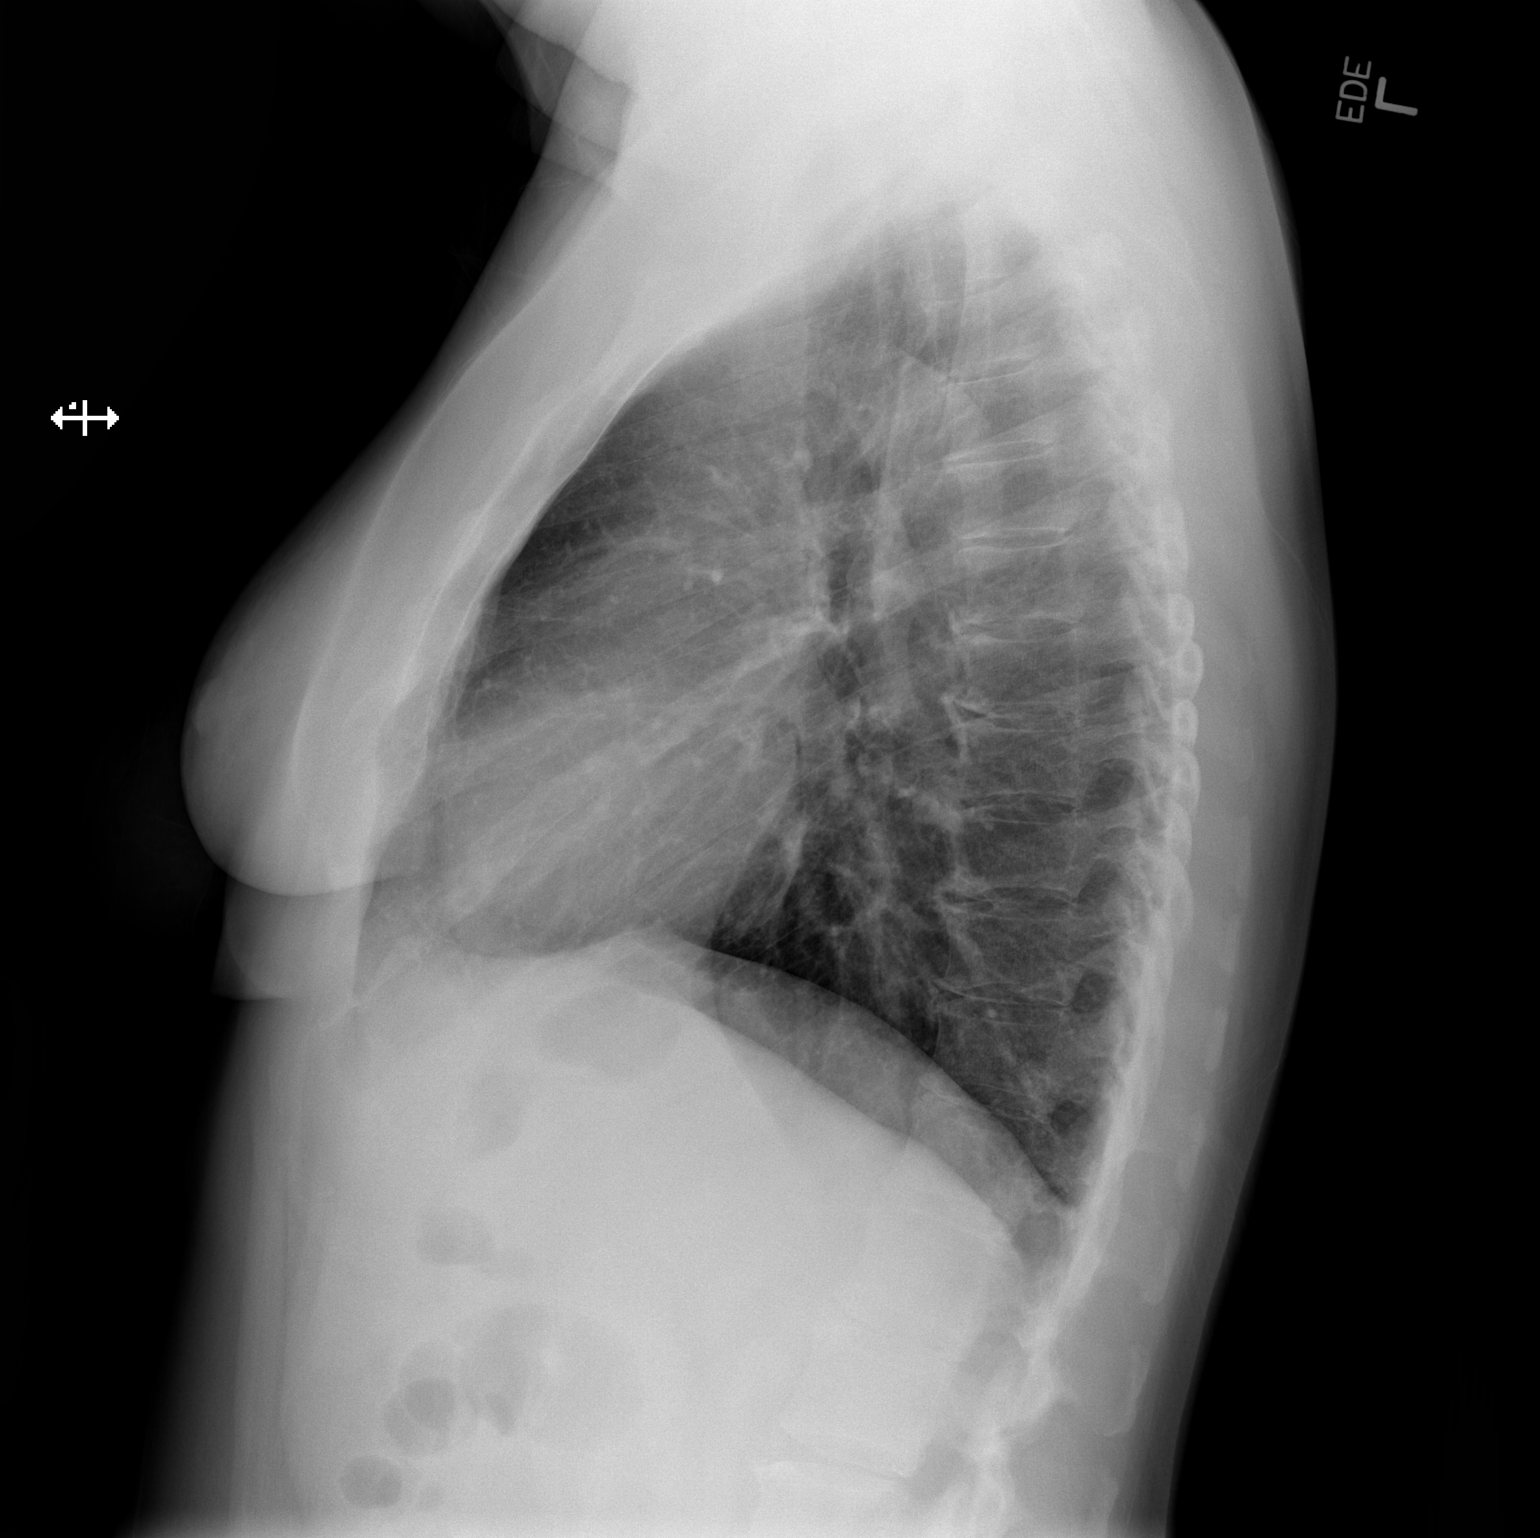

[2 of 2 positions shown; findings below may reference images not displayed]

FINDINGS: The lungs are adequately inflated. There is no focal infiltrate. The
lung markings are coarse bilaterally consistent with the patient's
smoking history. No pulmonary parenchymal nodules or masses are
observed. The heart and pulmonary vascularity are normal. The
mediastinum is normal in width. There is no pleural effusion. There
is mild multilevel degenerative disc disease of the mid and lower
thoracic spine.
IMPRESSION: Mild chronic bronchitic changes. There is no acute cardiopulmonary
abnormality.

## 2016-05-08 ENCOUNTER — Other Ambulatory Visit: Payer: Self-pay | Admitting: Orthopedic Surgery

## 2016-05-30 ENCOUNTER — Ambulatory Visit (INDEPENDENT_AMBULATORY_CARE_PROVIDER_SITE_OTHER): Payer: 59 | Admitting: Family Medicine

## 2016-05-30 ENCOUNTER — Encounter: Payer: Self-pay | Admitting: Family Medicine

## 2016-05-30 VITALS — BP 116/76 | HR 79 | Temp 98.3°F | Resp 16 | Ht 66.0 in | Wt 193.1 lb

## 2016-05-30 DIAGNOSIS — Z23 Encounter for immunization: Secondary | ICD-10-CM | POA: Diagnosis not present

## 2016-05-30 DIAGNOSIS — Z0000 Encounter for general adult medical examination without abnormal findings: Secondary | ICD-10-CM | POA: Diagnosis not present

## 2016-05-30 LAB — LIPID PANEL
CHOL/HDL RATIO: 3
CHOLESTEROL: 151 mg/dL (ref 0–200)
HDL: 58.1 mg/dL (ref >39.00)
LDL Cholesterol: 73 mg/dL (ref 0–99)
NonHDL: 92.4
TRIGLYCERIDES: 98 mg/dL (ref 0.0–149.0)
VLDL: 19.6 mg/dL (ref 0.0–40.0)

## 2016-05-30 LAB — CBC WITH DIFFERENTIAL/PLATELET
Basophils Absolute: 0.1 10*3/uL (ref 0.0–0.1)
Basophils Relative: 0.8 % (ref 0.0–3.0)
EOS PCT: 0.5 % (ref 0.0–5.0)
Eosinophils Absolute: 0 10*3/uL (ref 0.0–0.7)
HCT: 40.7 % (ref 36.0–46.0)
Hemoglobin: 13.4 g/dL (ref 12.0–15.0)
LYMPHS ABS: 2.4 10*3/uL (ref 0.7–4.0)
Lymphocytes Relative: 30.5 % (ref 12.0–46.0)
MCHC: 33 g/dL (ref 30.0–36.0)
MCV: 93.5 fl (ref 78.0–100.0)
MONO ABS: 0.5 10*3/uL (ref 0.1–1.0)
Monocytes Relative: 6.6 % (ref 3.0–12.0)
NEUTROS PCT: 61.6 % (ref 43.0–77.0)
Neutro Abs: 4.8 10*3/uL (ref 1.4–7.7)
Platelets: 285 10*3/uL (ref 150.0–400.0)
RBC: 4.36 Mil/uL (ref 3.87–5.11)
RDW: 14.3 % (ref 11.5–15.5)
WBC: 7.9 10*3/uL (ref 4.0–10.5)

## 2016-05-30 LAB — HEPATIC FUNCTION PANEL
ALBUMIN: 3.8 g/dL (ref 3.5–5.2)
ALK PHOS: 47 U/L (ref 39–117)
ALT: 17 U/L (ref 0–35)
AST: 17 U/L (ref 0–37)
Bilirubin, Direct: 0.1 mg/dL (ref 0.0–0.3)
Total Bilirubin: 0.4 mg/dL (ref 0.2–1.2)
Total Protein: 6.9 g/dL (ref 6.0–8.3)

## 2016-05-30 LAB — BASIC METABOLIC PANEL
BUN: 12 mg/dL (ref 6–23)
CHLORIDE: 102 meq/L (ref 96–112)
CO2: 28 mEq/L (ref 19–32)
Calcium: 9.7 mg/dL (ref 8.4–10.5)
Creatinine, Ser: 0.87 mg/dL (ref 0.40–1.20)
GFR: 74.68 mL/min (ref >60.00)
GLUCOSE: 91 mg/dL (ref 70–99)
POTASSIUM: 3.9 meq/L (ref 3.5–5.1)
Sodium: 138 mEq/L (ref 135–145)

## 2016-05-30 LAB — VITAMIN D 25 HYDROXY (VIT D DEFICIENCY, FRACTURES): VITD: 53.29 ng/mL (ref 30.00–100.00)

## 2016-05-30 LAB — TSH: TSH: 1.82 u[IU]/mL (ref 0.35–4.50)

## 2016-05-30 NOTE — Addendum Note (Signed)
Addended by: Davis Gourd on: 05/30/2016 09:52 AM   Modules accepted: Orders

## 2016-05-30 NOTE — Progress Notes (Signed)
Pre visit review using our clinic review tool, if applicable. No additional management support is needed unless otherwise documented below in the visit note. 

## 2016-05-30 NOTE — Patient Instructions (Addendum)
Follow up in 1 year or as needed We'll notify you of your lab results and make any changes if needed Keep up the good work on healthy diet and regular exercise- you look great!!! Call with any questions or concerns GOOD LUCK WITH YOUR SURGERY!!!

## 2016-05-30 NOTE — Assessment & Plan Note (Signed)
Pt's PE WNL w/ exception of being overweight.  UTD on GYN.  Check labs.  Tdap and Flu given today at pt's request.  Anticipatory guidance provided.

## 2016-05-30 NOTE — Progress Notes (Signed)
   Subjective:    Patient ID: Emma Wagner, female    DOB: August 06, 1971, 45 y.o.   MRN: [id number]  HPI CPE- UTD on pap w/ [place] [facility].  UTD on mammo (done September- normal).  Pt has lost 12 lbs since last visit.   Review of Systems Patient reports no vision/ hearing changes, adenopathy,fever, weight change,  persistant/recurrent hoarseness , swallowing issues, chest pain, palpitations, edema, persistant/recurrent cough, hemoptysis, dyspnea (rest/exertional/paroxysmal nocturnal), gastrointestinal bleeding (melena, rectal bleeding), abdominal pain, significant heartburn, bowel changes, GU symptoms (dysuria, hematuria, incontinence), Gyn symptoms (abnormal  bleeding, pain),  syncope, focal weakness, memory loss, numbness & tingling, skin/hair/nail changes, abnormal bruising or bleeding, anxiety, or depression.     Objective:   Physical Exam General Appearance:    Alert, cooperative, no distress, appears stated age  Head:    Normocephalic, without obvious abnormality, atraumatic  Eyes:    PERRL, conjunctiva/corneas clear, EOM's intact, fundi    benign, both eyes  Ears:    Normal TM's and external ear canals, both ears  Nose:   Nares normal, septum midline, mucosa normal, no drainage    or sinus tenderness  Throat:   Lips, mucosa, and tongue normal; teeth and gums normal  Neck:   Supple, symmetrical, trachea midline, no adenopathy;    Thyroid: no enlargement/tenderness/nodules  Back:     Symmetric, no curvature, ROM normal, no CVA tenderness  Lungs:     Clear to auscultation bilaterally, respirations unlabored  Chest Wall:    No tenderness or deformity   Heart:    Regular rate and rhythm, S1 and S2 normal, no murmur, rub   or gallop  Breast Exam:    Deferred to GYN  Abdomen:     Soft, non-tender, bowel sounds active all four quadrants,    no masses, no organomegaly  Genitalia:    Deferred to GYN  Rectal:    Extremities:   Extremities normal, atraumatic, no cyanosis or edema  Pulses:   2+  and symmetric all extremities  Skin:   Skin color, texture, turgor normal, no rashes or lesions  Lymph nodes:   Cervical, supraclavicular, and axillary nodes normal  Neurologic:   CNII-XII intact, normal strength, sensation and reflexes    throughout          Assessment & Plan:

## 2016-06-01 ENCOUNTER — Encounter (HOSPITAL_COMMUNITY)
Admission: RE | Admit: 2016-06-01 | Discharge: 2016-06-01 | Disposition: A | Discharge status: Home / Self Care | Payer: 59 | Source: Ambulatory Visit | Attending: Orthopedic Surgery | Admitting: Orthopedic Surgery

## 2016-06-01 ENCOUNTER — Other Ambulatory Visit (HOSPITAL_COMMUNITY): Payer: Self-pay | Admitting: *Deleted

## 2016-06-01 ENCOUNTER — Encounter (HOSPITAL_COMMUNITY): Payer: Self-pay

## 2016-06-01 DIAGNOSIS — Z01812 Encounter for preprocedural laboratory examination: Secondary | ICD-10-CM | POA: Insufficient documentation

## 2016-06-01 DIAGNOSIS — Z0183 Encounter for blood typing: Secondary | ICD-10-CM | POA: Insufficient documentation

## 2016-06-01 DIAGNOSIS — M1611 Unilateral primary osteoarthritis, right hip: Secondary | ICD-10-CM | POA: Diagnosis not present

## 2016-06-01 HISTORY — DX: Personal history of urinary calculi: Z87.442

## 2016-06-01 HISTORY — DX: Constipation, unspecified: K59.00

## 2016-06-01 HISTORY — DX: Pneumonia, unspecified organism: J18.9

## 2016-06-01 LAB — URINALYSIS, ROUTINE W REFLEX MICROSCOPIC
BILIRUBIN URINE: NEGATIVE
GLUCOSE, UA: NEGATIVE mg/dL
HGB URINE DIPSTICK: NEGATIVE
Ketones, ur: NEGATIVE mg/dL
Leukocytes, UA: NEGATIVE
Nitrite: NEGATIVE
Protein, ur: NEGATIVE mg/dL
SPECIFIC GRAVITY, URINE: 1.014 (ref 1.005–1.030)
pH: 7 (ref 5.0–8.0)

## 2016-06-01 LAB — TYPE AND SCREEN
ABO/RH(D): A POS
ANTIBODY SCREEN: NEGATIVE

## 2016-06-01 LAB — SURGICAL PCR SCREEN
MRSA, PCR: NEGATIVE
STAPHYLOCOCCUS AUREUS: NEGATIVE

## 2016-06-01 LAB — PROTIME-INR
INR: 0.98
PROTHROMBIN TIME: 13 s (ref 11.4–15.2)

## 2016-06-01 LAB — HCG, SERUM, QUALITATIVE: PREG SERUM: NEGATIVE

## 2016-06-01 LAB — APTT: APTT: 30 s (ref 24–36)

## 2016-06-01 NOTE — Pre-Procedure Instructions (Signed)
Emma Wagner  06/01/2016     Your procedure is scheduled on Wednesday, June 13, 2016 at 12:30 PM.   Report to [facility] Entrance "A" Admitting Office at 10:30 AM.   Call this number if you have problems the morning of surgery:  [phone]   Questions prior to day of surgery, please call [phone] between 8 & 4 PM.   Remember:  Do not eat food or drink liquids after midnight Tuesday, 06/12/16.  Take these medicines the morning of surgery with A SIP OF WATER: Bupropion (Wellbutrin), Fluoxetine (Prozac), Guaifenesin (Mucinex), Omeprazole (Prilosec)  Stop NSAIDS (Ibuprofen, Aleve, etc.), Multivitamins and Herbal medications 7 days prior to surgery.   Do not wear jewelry, make-up or nail polish.  Do not wear lotions, powders, or perfumes.  Do not shave 48 hours prior to surgery.    Do not bring valuables to the hospital.  [facility] is not responsible for any belongings or valuables.  Contacts, dentures or bridgework may not be worn into surgery.  Leave your suitcase in the car.  After surgery it may be brought to your room.  For patients admitted to the hospital, discharge time will be determined by your treatment team.  Special instructions:  [place] - Preparing for Surgery  Before surgery, you can play an important role.  Because skin is not sterile, your skin needs to be as free of germs as possible.  You can reduce the number of germs on you skin by washing with CHG (chlorahexidine gluconate) soap before surgery.  CHG is an antiseptic cleaner which kills germs and bonds with the skin to continue killing germs even after washing.  Please DO NOT use if you have an allergy to CHG or antibacterial soaps.  If your skin becomes reddened/irritated stop using the CHG and inform your nurse when you arrive at Short Stay.  Do not shave (including legs and underarms) for at least 48 hours prior to the first CHG shower.  You may shave your face.  Please follow  these instructions carefully:   1.  Shower with CHG Soap the night before surgery and the                    morning of Surgery.  2.  If you choose to wash your hair, wash your hair first as usual with your       normal shampoo.  3.  After you shampoo, rinse your hair and body thoroughly to remove the shampoo.  4.  Use CHG as you would any other liquid soap.  You can apply chg directly       to the skin and wash gently with scrungie or a clean washcloth.  5.  Apply the CHG Soap to your body ONLY FROM THE NECK DOWN.        Do not use on open wounds or open sores.  Avoid contact with your eyes, ears, mouth and genitals (private parts).  Wash genitals (private parts) with your normal soap.  6.  Wash thoroughly, paying special attention to the area where your surgery        will be performed.  7.  Thoroughly rinse your body with warm water from the neck down.  8.  DO NOT shower/wash with your normal soap after using and rinsing off       the CHG Soap.  9.  Pat yourself dry with a clean towel.  10.  Wear clean pajamas.            11.  Place clean sheets on your bed the night of your first shower and do not        sleep with pets.  Day of Surgery  Do not apply any lotions the morning of surgery.  Please wear clean clothes to the hospital.   Please read over the following fact sheets that you were given. Pain Booklet, Coughing and Deep Breathing, MRSA Information and Surgical Site Infection Prevention

## 2016-06-01 NOTE — Progress Notes (Signed)
Pt denies cardiac history, chest pain or sob. Pt states she's had some chest congestion in past 2 weeks. No obvious sob or wheezing noted. No coughing during PAT appt.

## 2016-06-12 NOTE — H&P (Signed)
TOTAL HIP ADMISSION H&P  Patient is admitted for right total hip arthroplasty.  Subjective:  Chief Complaint: right hip pain  HPI: Emma Wagner, 45 y.o. female, has a history of pain and functional disability in the right hip(s) due to arthritis and patient has failed non-surgical conservative treatments for greater than 12 weeks to include NSAID's and/or analgesics, flexibility and strengthening excercises, supervised PT with diminished ADL's post treatment, use of assistive devices, weight reduction as appropriate and activity modification.  Onset of symptoms was gradual starting 2 years ago with gradually worsening course since that time.The patient noted no past surgery on the right hip(s).  Patient currently rates pain in the right hip at 10 out of 10 with activity. Patient has night pain, worsening of pain with activity and weight bearing, pain that interfers with activities of daily living and pain with passive range of motion. Patient has evidence of joint space narrowing by imaging studies. This condition presents safety issues increasing the risk of falls.    There is no current active infection.  Patient Active Problem List   Diagnosis Date Noted  . Primary osteoarthritis of left hip 10/31/2015  . Primary osteoarthritis of right hip 10/30/2015  . Pain of right upper arm 05/12/2015  . Hair loss 05/02/2012  . Prediabetes 02/20/2012  . Thrombosed external hemorrhoid 02/08/2012  . Perirectal abscess 02/07/2012  . Post-nasal drip 11/27/2011  . Anxiety and depression 11/27/2011  . Arthritis of right hip 11/27/2011  . General medical examination 10/29/2011  . Cough 09/17/2011  . Obesity (BMI 30-39.9) 09/17/2011  . PNA (pneumonia) 08/03/2011  . Tobacco abuse 08/03/2011  . X-ray of lung, abnormal 06/11/2007  . GERD 06/11/2007   Past Medical History:  Diagnosis Date  . Anxiety    add also  . Arthritis   . Borderline type 2 diabetes mellitus since 2013   -lost weight and quit  smoking and is no longer PRE diabetic  . Bursitis    bilateral hips, more on right hip  . Chronic kidney disease    h/o kidney stone 2016  . Constipation   . Depression   . Frequent urination    while kidney stone evident  . GERD (gastroesophageal reflux disease)   . Headache    "every blue moon. not often"  . Hemorrhoids   . History of kidney stones   . Pneumonia   . PONV (postoperative nausea and vomiting)    nausea with appendectomy and with hip replacement in March 2017    Past Surgical History:  Procedure Laterality Date  . APPENDECTOMY  1985  . CESAREAN SECTION  2009  . TOTAL HIP ARTHROPLASTY Left 10/31/2015   Procedure: TOTAL HIP ARTHROPLASTY ANTERIOR APPROACH;  Surgeon: Frederik Pear, MD;  Location: [facility];  Service: Orthopedics;  Laterality: Left;  . TUBAL LIGATION  2009  . wisdom teth      No prescriptions prior to admission.   No Known Allergies  Social History  Substance Use Topics  . Smoking status: Former Smoker    Packs/day: 1.00    Years: 26.00    Quit date: 07/20/2012  . Smokeless tobacco: Never Used  . Alcohol use Yes     Comment: occasional    Family History  Problem Relation Age of Onset  . Heart failure Other   . Hypertension Other   . Cancer Other   . Anxiety disorder Other   . Depression Other   . Arthritis Mother   . Hypertension Mother   .  Prostate cancer Father   . Skin cancer Father   . Congestive Heart Failure Father   . Hypertension Father      Review of Systems  Constitutional: Positive for malaise/fatigue.  Cardiovascular: Positive for leg swelling.  Gastrointestinal: Positive for heartburn.  Genitourinary:       Kidney stones  Musculoskeletal: Positive for joint pain and myalgias.  Psychiatric/Behavioral: The patient is nervous/anxious and has insomnia.     Objective:  Physical Exam  Constitutional: She is oriented to person, place, and time. She appears well-developed and well-nourished.  HENT:  Head: Normocephalic and  atraumatic.  Eyes: Pupils are equal, round, and reactive to light.  Neck: Normal range of motion. Neck supple.  Cardiovascular: Intact distal pulses.   Respiratory: Effort normal.  Musculoskeletal: She exhibits tenderness.  she has good strength and good range of motion in the left hip.  No pain with hip flexion extension internal/external rotation and log roll.  Patient's right hip does have severe pain with internal rotation.  She can internally rotate to approximately 10-15.    Neurological: She is alert and oriented to person, place, and time.  Skin: Skin is warm and dry.  Psychiatric: She has a normal mood and affect. Her behavior is normal. Judgment and thought content normal.    Vital signs in last 24 hours:    Labs:   Estimated body mass index is 30.97 kg/m as calculated from the following:   Height as of 06/01/16: 5\' 6"  (1.676 m).   Weight as of 06/01/16: 87 kg (191 lb 14.4 oz).   Imaging Review Plain radiographs demonstrate  AP pelvis and crosstable lateral of the left hip are taken and reviewed in office today.  Patient's left total hip arthroplasty appears be well-placed and well fixed.  Patient's right hip does have moderate to severe arthritis  Assessment/Plan:  End stage arthritis, right hip(s)  The patient history, physical examination, clinical judgement of the provider and imaging studies are consistent with end stage degenerative joint disease of the right hip(s) and total hip arthroplasty is deemed medically necessary. The treatment options including medical management, injection therapy, arthroscopy and arthroplasty were discussed at length. The risks and benefits of total hip arthroplasty were presented and reviewed. The risks due to aseptic loosening, infection, stiffness, dislocation/subluxation,  thromboembolic complications and other imponderables were discussed.  The patient acknowledged the explanation, agreed to proceed with the plan and consent was  signed. Patient is being admitted for inpatient treatment for surgery, pain control, PT, OT, prophylactic antibiotics, VTE prophylaxis, progressive ambulation and ADL's and discharge planning.The patient is planning to be discharged home with home health services

## 2016-06-13 ENCOUNTER — Encounter (HOSPITAL_COMMUNITY): Payer: Self-pay | Admitting: Anesthesiology

## 2016-06-13 ENCOUNTER — Inpatient Hospital Stay (HOSPITAL_COMMUNITY)
Admission: RE | Admit: 2016-06-13 | Discharge: 2016-06-16 | DRG: 470 | Disposition: A | Discharge status: Home Health | Payer: 59 | Source: Ambulatory Visit | Attending: Orthopedic Surgery | Admitting: Orthopedic Surgery

## 2016-06-13 ENCOUNTER — Inpatient Hospital Stay (HOSPITAL_COMMUNITY): Payer: 59 | Admitting: Anesthesiology

## 2016-06-13 ENCOUNTER — Inpatient Hospital Stay (HOSPITAL_COMMUNITY): Payer: 59

## 2016-06-13 ENCOUNTER — Encounter (HOSPITAL_COMMUNITY)
Admission: RE | Disposition: A | Discharge status: Home Health | Payer: Self-pay | Source: Ambulatory Visit | Attending: Orthopedic Surgery

## 2016-06-13 DIAGNOSIS — Z87442 Personal history of urinary calculi: Secondary | ICD-10-CM | POA: Diagnosis not present

## 2016-06-13 DIAGNOSIS — Q6589 Other specified congenital deformities of hip: Secondary | ICD-10-CM | POA: Diagnosis not present

## 2016-06-13 DIAGNOSIS — Z79899 Other long term (current) drug therapy: Secondary | ICD-10-CM

## 2016-06-13 DIAGNOSIS — Z96642 Presence of left artificial hip joint: Secondary | ICD-10-CM | POA: Diagnosis present

## 2016-06-13 DIAGNOSIS — Z8249 Family history of ischemic heart disease and other diseases of the circulatory system: Secondary | ICD-10-CM

## 2016-06-13 DIAGNOSIS — F419 Anxiety disorder, unspecified: Secondary | ICD-10-CM | POA: Diagnosis present

## 2016-06-13 DIAGNOSIS — Z8261 Family history of arthritis: Secondary | ICD-10-CM | POA: Diagnosis not present

## 2016-06-13 DIAGNOSIS — Z6830 Body mass index (BMI) 30.0-30.9, adult: Secondary | ICD-10-CM | POA: Diagnosis not present

## 2016-06-13 DIAGNOSIS — M1611 Unilateral primary osteoarthritis, right hip: Secondary | ICD-10-CM | POA: Diagnosis present

## 2016-06-13 DIAGNOSIS — Z87891 Personal history of nicotine dependence: Secondary | ICD-10-CM

## 2016-06-13 DIAGNOSIS — F329 Major depressive disorder, single episode, unspecified: Secondary | ICD-10-CM | POA: Diagnosis present

## 2016-06-13 DIAGNOSIS — D62 Acute posthemorrhagic anemia: Secondary | ICD-10-CM | POA: Diagnosis not present

## 2016-06-13 DIAGNOSIS — E669 Obesity, unspecified: Secondary | ICD-10-CM | POA: Diagnosis present

## 2016-06-13 DIAGNOSIS — M25551 Pain in right hip: Secondary | ICD-10-CM | POA: Diagnosis present

## 2016-06-13 DIAGNOSIS — K219 Gastro-esophageal reflux disease without esophagitis: Secondary | ICD-10-CM | POA: Diagnosis present

## 2016-06-13 DIAGNOSIS — Z419 Encounter for procedure for purposes other than remedying health state, unspecified: Secondary | ICD-10-CM

## 2016-06-13 HISTORY — PX: TOTAL HIP ARTHROPLASTY: SHX124

## 2016-06-13 SURGERY — ARTHROPLASTY, HIP, TOTAL, ANTERIOR APPROACH
Anesthesia: Monitor Anesthesia Care | Site: Hip | Laterality: Right

## 2016-06-13 MED ORDER — TRANEXAMIC ACID 1000 MG/10ML IV SOLN
1000.0000 mg | INTRAVENOUS | Status: AC
  Administered 2016-06-13: 1000 mg via INTRAVENOUS
  Filled 2016-06-13: qty 10

## 2016-06-13 MED ORDER — 0.9 % SODIUM CHLORIDE (POUR BTL) OPTIME
TOPICAL | Status: DC | PRN
  Administered 2016-06-13: 1000 mL

## 2016-06-13 MED ORDER — FENTANYL CITRATE (PF) 100 MCG/2ML IJ SOLN
INTRAMUSCULAR | Status: AC
  Filled 2016-06-13: qty 2

## 2016-06-13 MED ORDER — ONDANSETRON HCL 4 MG/2ML IJ SOLN
4.0000 mg | Freq: Four times a day (QID) | INTRAMUSCULAR | Status: DC | PRN
  Filled 2016-06-13: qty 2

## 2016-06-13 MED ORDER — ONDANSETRON HCL 4 MG/2ML IJ SOLN: 4.0000 mg | Freq: Four times a day (QID) | INTRAMUSCULAR | Status: DC | PRN

## 2016-06-13 MED ORDER — ONDANSETRON HCL 4 MG/2ML IJ SOLN
INTRAMUSCULAR | Status: AC
  Filled 2016-06-13: qty 2

## 2016-06-13 MED ORDER — ASPIRIN EC 325 MG PO TBEC: 325.0000 mg | DELAYED_RELEASE_TABLET | Freq: Two times a day (BID) | ORAL | 0 refills | Status: DC

## 2016-06-13 MED ORDER — SCOPOLAMINE 1 MG/3DAYS TD PT72
MEDICATED_PATCH | TRANSDERMAL | Status: AC
  Filled 2016-06-13: qty 1

## 2016-06-13 MED ORDER — LACTATED RINGERS IV SOLN
Freq: Once | INTRAVENOUS | Status: AC
  Administered 2016-06-13: 11:00:00 via INTRAVENOUS

## 2016-06-13 MED ORDER — ACETAMINOPHEN 650 MG RE SUPP: 650.0000 mg | Freq: Four times a day (QID) | RECTAL | Status: DC | PRN

## 2016-06-13 MED ORDER — MIDAZOLAM HCL 2 MG/2ML IJ SOLN
INTRAMUSCULAR | Status: AC
  Filled 2016-06-13: qty 2

## 2016-06-13 MED ORDER — PROPOFOL 10 MG/ML IV BOLUS
INTRAVENOUS | Status: AC
  Filled 2016-06-13: qty 20

## 2016-06-13 MED ORDER — GABAPENTIN 300 MG PO CAPS
300.0000 mg | ORAL_CAPSULE | Freq: Three times a day (TID) | ORAL | Status: DC
  Administered 2016-06-13 – 2016-06-16 (×9): 300 mg via ORAL
  Filled 2016-06-13 (×9): qty 1

## 2016-06-13 MED ORDER — DIPHENHYDRAMINE HCL 12.5 MG/5ML PO ELIX: 12.5000 mg | ORAL_SOLUTION | ORAL | Status: DC | PRN

## 2016-06-13 MED ORDER — DEXAMETHASONE SODIUM PHOSPHATE 4 MG/ML IJ SOLN
INTRAMUSCULAR | Status: DC | PRN
  Administered 2016-06-13: 10 mg via INTRAVENOUS

## 2016-06-13 MED ORDER — METHOCARBAMOL 500 MG PO TABS
500.0000 mg | ORAL_TABLET | Freq: Four times a day (QID) | ORAL | Status: DC | PRN
  Administered 2016-06-13 – 2016-06-15 (×4): 500 mg via ORAL
  Filled 2016-06-13 (×4): qty 1

## 2016-06-13 MED ORDER — GUAIFENESIN ER 600 MG PO TB12
600.0000 mg | ORAL_TABLET | Freq: Two times a day (BID) | ORAL | Status: DC
  Administered 2016-06-16: 600 mg via ORAL
  Filled 2016-06-13 (×5): qty 1

## 2016-06-13 MED ORDER — BUPROPION HCL ER (XL) 150 MG PO TB24
300.0000 mg | ORAL_TABLET | Freq: Every day | ORAL | Status: DC
  Administered 2016-06-14 – 2016-06-16 (×3): 300 mg via ORAL
  Filled 2016-06-13 (×3): qty 2

## 2016-06-13 MED ORDER — LIDOCAINE 2% (20 MG/ML) 5 ML SYRINGE
INTRAMUSCULAR | Status: DC | PRN
  Administered 2016-06-13: 40 mg via INTRAVENOUS

## 2016-06-13 MED ORDER — DOCUSATE SODIUM 100 MG PO CAPS
100.0000 mg | ORAL_CAPSULE | Freq: Two times a day (BID) | ORAL | Status: DC
  Administered 2016-06-13 – 2016-06-16 (×6): 100 mg via ORAL
  Filled 2016-06-13 (×6): qty 1

## 2016-06-13 MED ORDER — DEXTROSE-NACL 5-0.45 % IV SOLN: INTRAVENOUS | Status: DC

## 2016-06-13 MED ORDER — MIDAZOLAM HCL 5 MG/5ML IJ SOLN: INTRAMUSCULAR | Status: DC | PRN

## 2016-06-13 MED ORDER — CELECOXIB 200 MG PO CAPS
200.0000 mg | ORAL_CAPSULE | Freq: Two times a day (BID) | ORAL | Status: DC
  Administered 2016-06-13 – 2016-06-16 (×6): 200 mg via ORAL
  Filled 2016-06-13 (×6): qty 1

## 2016-06-13 MED ORDER — OXYCODONE-ACETAMINOPHEN 5-325 MG PO TABS: 1.0000 | ORAL_TABLET | ORAL | 0 refills | Status: DC | PRN

## 2016-06-13 MED ORDER — BISACODYL 5 MG PO TBEC
5.0000 mg | DELAYED_RELEASE_TABLET | Freq: Every day | ORAL | Status: DC | PRN
  Administered 2016-06-15: 5 mg via ORAL
  Filled 2016-06-13: qty 1

## 2016-06-13 MED ORDER — ASPIRIN EC 325 MG PO TBEC
325.0000 mg | DELAYED_RELEASE_TABLET | Freq: Every day | ORAL | Status: DC
  Administered 2016-06-14 – 2016-06-16 (×3): 325 mg via ORAL
  Filled 2016-06-13 (×3): qty 1

## 2016-06-13 MED ORDER — LACTATED RINGERS IV SOLN
INTRAVENOUS | Status: DC | PRN
  Administered 2016-06-13 (×2): via INTRAVENOUS

## 2016-06-13 MED ORDER — BUPIVACAINE LIPOSOME 1.3 % IJ SUSP
INTRAMUSCULAR | Status: DC | PRN
  Administered 2016-06-13: 20 mL

## 2016-06-13 MED ORDER — OXYCODONE HCL 5 MG PO TABS
5.0000 mg | ORAL_TABLET | Freq: Once | ORAL | Status: AC | PRN
  Administered 2016-06-13: 5 mg via ORAL

## 2016-06-13 MED ORDER — CHLORHEXIDINE GLUCONATE 4 % EX LIQD: 60.0000 mL | Freq: Once | CUTANEOUS | Status: DC

## 2016-06-13 MED ORDER — BUPIVACAINE IN DEXTROSE 0.75-8.25 % IT SOLN
INTRATHECAL | Status: DC | PRN
  Administered 2016-06-13: 1.8 mL via INTRATHECAL

## 2016-06-13 MED ORDER — SODIUM CHLORIDE 0.45 % IV BOLUS: 500.0000 mL | Freq: Once | INTRAVENOUS | Status: DC

## 2016-06-13 MED ORDER — MENTHOL 3 MG MT LOZG: 1.0000 | LOZENGE | OROMUCOSAL | Status: DC | PRN

## 2016-06-13 MED ORDER — HYDROMORPHONE HCL 1 MG/ML IJ SOLN
0.2500 mg | INTRAMUSCULAR | Status: DC | PRN
  Administered 2016-06-13: 0.5 mg via INTRAVENOUS

## 2016-06-13 MED ORDER — PROPOFOL 500 MG/50ML IV EMUL
INTRAVENOUS | Status: DC | PRN
  Administered 2016-06-13: 75 ug/kg/min via INTRAVENOUS

## 2016-06-13 MED ORDER — PANTOPRAZOLE SODIUM 40 MG PO TBEC
40.0000 mg | DELAYED_RELEASE_TABLET | Freq: Every day | ORAL | Status: DC
  Administered 2016-06-14 – 2016-06-16 (×3): 40 mg via ORAL
  Filled 2016-06-13 (×3): qty 1

## 2016-06-13 MED ORDER — AMPHETAMINE-DEXTROAMPHETAMINE 10 MG PO TABS
20.0000 mg | ORAL_TABLET | Freq: Two times a day (BID) | ORAL | Status: DC
  Administered 2016-06-14 – 2016-06-16 (×5): 20 mg via ORAL
  Filled 2016-06-13 (×5): qty 2

## 2016-06-13 MED ORDER — FENTANYL CITRATE (PF) 100 MCG/2ML IJ SOLN
INTRAMUSCULAR | Status: DC | PRN
  Administered 2016-06-13 (×4): 50 ug via INTRAVENOUS

## 2016-06-13 MED ORDER — FLEET ENEMA 7-19 GM/118ML RE ENEM: 1.0000 | ENEMA | Freq: Once | RECTAL | Status: DC | PRN

## 2016-06-13 MED ORDER — SENNOSIDES-DOCUSATE SODIUM 8.6-50 MG PO TABS
1.0000 | ORAL_TABLET | Freq: Every evening | ORAL | Status: DC | PRN
  Administered 2016-06-15: 1 via ORAL
  Filled 2016-06-13: qty 1

## 2016-06-13 MED ORDER — METOCLOPRAMIDE HCL 5 MG PO TABS: 5.0000 mg | ORAL_TABLET | Freq: Three times a day (TID) | ORAL | Status: DC | PRN

## 2016-06-13 MED ORDER — TRANEXAMIC ACID 1000 MG/10ML IV SOLN
INTRAVENOUS | Status: DC | PRN
  Administered 2016-06-13: 2000 mg via TOPICAL

## 2016-06-13 MED ORDER — TRANEXAMIC ACID 1000 MG/10ML IV SOLN
2000.0000 mg | Freq: Once | INTRAVENOUS | Status: DC
  Filled 2016-06-13: qty 20

## 2016-06-13 MED ORDER — METHOCARBAMOL 1000 MG/10ML IJ SOLN: 500.0000 mg | Freq: Four times a day (QID) | INTRAVENOUS | Status: DC | PRN

## 2016-06-13 MED ORDER — HYDROMORPHONE HCL 2 MG/ML IJ SOLN
1.0000 mg | INTRAMUSCULAR | Status: DC | PRN
Start: 2016-06-13 — End: 2016-06-16
  Administered 2016-06-13 – 2016-06-15 (×5): 1 mg via INTRAVENOUS
  Filled 2016-06-13 (×5): qty 1

## 2016-06-13 MED ORDER — LIDOCAINE 2% (20 MG/ML) 5 ML SYRINGE
INTRAMUSCULAR | Status: AC
  Filled 2016-06-13: qty 5

## 2016-06-13 MED ORDER — ONDANSETRON HCL 4 MG/2ML IJ SOLN
INTRAMUSCULAR | Status: DC | PRN
  Administered 2016-06-13: 4 mg via INTRAVENOUS

## 2016-06-13 MED ORDER — PHENOL 1.4 % MT LIQD
1.0000 | OROMUCOSAL | Status: DC | PRN
Start: 2016-06-13 — End: 2016-06-16

## 2016-06-13 MED ORDER — BUPIVACAINE LIPOSOME 1.3 % IJ SUSP
20.0000 mL | Freq: Once | INTRAMUSCULAR | Status: DC
  Filled 2016-06-13: qty 20

## 2016-06-13 MED ORDER — ALUM & MAG HYDROXIDE-SIMETH 200-200-20 MG/5ML PO SUSP
30.0000 mL | ORAL | Status: DC | PRN
  Administered 2016-06-14: 30 mL via ORAL
  Filled 2016-06-13: qty 30

## 2016-06-13 MED ORDER — METOCLOPRAMIDE HCL 5 MG/ML IJ SOLN: 5.0000 mg | Freq: Three times a day (TID) | INTRAMUSCULAR | Status: DC | PRN

## 2016-06-13 MED ORDER — MIDAZOLAM HCL 5 MG/5ML IJ SOLN
INTRAMUSCULAR | Status: DC | PRN
  Administered 2016-06-13: 2 mg via INTRAVENOUS

## 2016-06-13 MED ORDER — METHOCARBAMOL 500 MG PO TABS: 500.0000 mg | ORAL_TABLET | Freq: Two times a day (BID) | ORAL | 0 refills | Status: DC

## 2016-06-13 MED ORDER — CEFAZOLIN SODIUM-DEXTROSE 2-4 GM/100ML-% IV SOLN
2.0000 g | INTRAVENOUS | Status: AC
  Administered 2016-06-13: 2 g via INTRAVENOUS
  Filled 2016-06-13: qty 100

## 2016-06-13 MED ORDER — FLUOXETINE HCL 20 MG PO CAPS
40.0000 mg | ORAL_CAPSULE | Freq: Every day | ORAL | Status: DC
  Administered 2016-06-14 – 2016-06-16 (×3): 40 mg via ORAL
  Filled 2016-06-13 (×3): qty 2

## 2016-06-13 MED ORDER — OXYCODONE HCL 5 MG PO TABS
5.0000 mg | ORAL_TABLET | ORAL | Status: DC | PRN
Start: 2016-06-13 — End: 2016-06-16
  Administered 2016-06-13 – 2016-06-16 (×13): 10 mg via ORAL
  Filled 2016-06-13 (×14): qty 2

## 2016-06-13 MED ORDER — BUPIVACAINE HCL (PF) 0.5 % IJ SOLN
INTRAMUSCULAR | Status: DC | PRN
  Administered 2016-06-13: 25 mL

## 2016-06-13 MED ORDER — ONDANSETRON HCL 4 MG PO TABS: 4.0000 mg | ORAL_TABLET | Freq: Four times a day (QID) | ORAL | Status: DC | PRN

## 2016-06-13 MED ORDER — HYDROMORPHONE HCL 2 MG/ML IJ SOLN
INTRAMUSCULAR | Status: AC
  Filled 2016-06-13: qty 1

## 2016-06-13 MED ORDER — DEXAMETHASONE SODIUM PHOSPHATE 10 MG/ML IJ SOLN
INTRAMUSCULAR | Status: AC
  Filled 2016-06-13: qty 1

## 2016-06-13 MED ORDER — OXYCODONE HCL 5 MG PO TABS
ORAL_TABLET | ORAL | Status: AC
  Filled 2016-06-13: qty 1

## 2016-06-13 MED ORDER — TRANEXAMIC ACID 1000 MG/10ML IV SOLN
1000.0000 mg | Freq: Once | INTRAVENOUS | Status: AC
  Administered 2016-06-13: 1000 mg via INTRAVENOUS
  Filled 2016-06-13: qty 10

## 2016-06-13 MED ORDER — DEXAMETHASONE SODIUM PHOSPHATE 10 MG/ML IJ SOLN
10.0000 mg | Freq: Once | INTRAMUSCULAR | Status: AC
  Administered 2016-06-14: 10 mg via INTRAVENOUS
  Filled 2016-06-13: qty 1

## 2016-06-13 MED ORDER — OXYCODONE HCL 5 MG/5ML PO SOLN: 5.0000 mg | Freq: Once | ORAL | Status: AC | PRN

## 2016-06-13 MED ORDER — ACETAMINOPHEN 325 MG PO TABS
650.0000 mg | ORAL_TABLET | Freq: Four times a day (QID) | ORAL | Status: DC | PRN
  Administered 2016-06-14: 650 mg via ORAL
  Filled 2016-06-13: qty 2

## 2016-06-13 MED ORDER — KCL IN DEXTROSE-NACL 20-5-0.45 MEQ/L-%-% IV SOLN
INTRAVENOUS | Status: DC
  Administered 2016-06-13: 18:00:00 via INTRAVENOUS
  Filled 2016-06-13 (×2): qty 1000

## 2016-06-13 MED ORDER — SCOPOLAMINE 1 MG/3DAYS TD PT72
MEDICATED_PATCH | TRANSDERMAL | Status: DC | PRN
  Administered 2016-06-13: 1 via TRANSDERMAL

## 2016-06-13 SURGICAL SUPPLY — 48 items
BAG DECANTER FOR FLEXI CONT (MISCELLANEOUS) ×2 IMPLANT
BLADE SURG ROTATE 9660 (MISCELLANEOUS) IMPLANT
CAPT HIP TOTAL 2 ×2 IMPLANT
COVER PERINEAL POST (MISCELLANEOUS) ×3 IMPLANT
COVER SURGICAL LIGHT HANDLE (MISCELLANEOUS) ×3 IMPLANT
DRAPE C-ARM 42X72 X-RAY (DRAPES) ×3 IMPLANT
DRAPE STERI IOBAN 125X83 (DRAPES) ×3 IMPLANT
DRAPE U-SHAPE 47X51 STRL (DRAPES) ×6 IMPLANT
DRSG AQUACEL AG ADV 3.5X10 (GAUZE/BANDAGES/DRESSINGS) ×3 IMPLANT
DURAPREP 26ML APPLICATOR (WOUND CARE) ×3 IMPLANT
ELECT BLADE 4.0 EZ CLEAN MEGAD (MISCELLANEOUS) ×3
ELECT REM PT RETURN 9FT ADLT (ELECTROSURGICAL) ×3
ELECTRODE BLDE 4.0 EZ CLN MEGD (MISCELLANEOUS) ×1 IMPLANT
ELECTRODE REM PT RTRN 9FT ADLT (ELECTROSURGICAL) ×1 IMPLANT
FACESHIELD WRAPAROUND (MASK) ×6 IMPLANT
FACESHIELD WRAPAROUND OR TEAM (MASK) ×2 IMPLANT
GLOVE BIO SURGEON STRL SZ7.5 (GLOVE) ×5 IMPLANT
GLOVE BIO SURGEON STRL SZ8.5 (GLOVE) ×3 IMPLANT
GLOVE BIOGEL PI IND STRL 8 (GLOVE) ×1 IMPLANT
GLOVE BIOGEL PI IND STRL 9 (GLOVE) ×1 IMPLANT
GLOVE BIOGEL PI INDICATOR 8 (GLOVE) ×4
GLOVE BIOGEL PI INDICATOR 9 (GLOVE) ×2
GLOVE BIOGEL PI ORTHO PRO SZ7 (GLOVE) ×4
GLOVE PI ORTHO PRO STRL SZ7 (GLOVE) IMPLANT
GOWN STRL REUS W/ TWL LRG LVL3 (GOWN DISPOSABLE) ×1 IMPLANT
GOWN STRL REUS W/ TWL XL LVL3 (GOWN DISPOSABLE) ×2 IMPLANT
GOWN STRL REUS W/TWL LRG LVL3 (GOWN DISPOSABLE) ×6
GOWN STRL REUS W/TWL XL LVL3 (GOWN DISPOSABLE) ×9
KIT BASIN OR (CUSTOM PROCEDURE TRAY) ×3 IMPLANT
KIT ROOM TURNOVER OR (KITS) ×3 IMPLANT
MANIFOLD NEPTUNE II (INSTRUMENTS) ×3 IMPLANT
NDL 22X1.5 STRL (OR ONLY) (MISCELLANEOUS) ×2 IMPLANT
NEEDLE 22X1 1/2 OR ONLY (MISCELLANEOUS) ×4
NEEDLE 22X1.5 STRL (OR ONLY) (MISCELLANEOUS) ×2 IMPLANT
NS IRRIG 1000ML POUR BTL (IV SOLUTION) ×3 IMPLANT
PACK TOTAL JOINT (CUSTOM PROCEDURE TRAY) ×3 IMPLANT
PAD ARMBOARD 7.5X6 YLW CONV (MISCELLANEOUS) ×6 IMPLANT
SAW OSC TIP CART 19.5X105X1.3 (SAW) ×3 IMPLANT
SUT VIC AB 1 CTX 36 (SUTURE) ×3
SUT VIC AB 1 CTX36XBRD ANBCTR (SUTURE) ×1 IMPLANT
SUT VIC AB 2-0 CT1 27 (SUTURE) ×6
SUT VIC AB 2-0 CT1 TAPERPNT 27 (SUTURE) ×2 IMPLANT
SUT VIC AB 3-0 PS2 18 (SUTURE) ×3
SUT VIC AB 3-0 PS2 18XBRD (SUTURE) ×1 IMPLANT
SYR CONTROL 10ML LL (SYRINGE) ×6 IMPLANT
TOWEL OR 17X24 6PK STRL BLUE (TOWEL DISPOSABLE) ×3 IMPLANT
TOWEL OR 17X26 10 PK STRL BLUE (TOWEL DISPOSABLE) ×3 IMPLANT
TRAY FOLEY CATH 14FR (SET/KITS/TRAYS/PACK) IMPLANT

## 2016-06-13 NOTE — Op Note (Signed)
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  (present throughout entire procedure and necessary for timely completion of the procedure)   ANESTHESIA: Spinal BLOOD LOSS: 500 FLUID REPLACEMENT: 1600 crystalloid Antibiotic: 2gm ancef Tranexamic Acid: 1gm iv, 2gm topical COMPLICATIONS: none    INDICATIONS FOR PROCEDURE: A 45 y.o. 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 Despite conservative measures with observation, anti-inflammatory medicine, narcotics, use of a cane, has severe unremitting pain and can ambulate only a few blocks before resting. Patient desires elective R total hip arthroplasty to decrease pain and increase function. The risks, benefits, and alternatives were discussed at length including but not limited to the risks of infection, bleeding, nerve injury, stiffness, blood clots, the need for revision surgery, cardiopulmonary complications, among others, and they were willing to proceed. Questions answered     PROCEDURE IN DETAIL: The patient was identified by armband,  received preoperative IV antibiotics in the holding area at [facility], taken to the operating room , appropriate anesthetic monitors  were attached and  anesthesia was induced with the patienton the gurney. The HANA boots were applied to the feet and he was then transferred to the HANA table with  a peroneal post and support underneath the non-operative le, which was locked in 5 lb traction. Theoperative lower extremity was then prepped and draped in the usual sterile fashion from just above the iliac crest to the knee. And a timeout procedure was performed. We then made a 12 cm incision along the interval at the leading edge of the tensor fascia lata of starting at 2 cm lateral to and 2 cm distal to the ASIS. Small bleeders in the skin and subcutaneous tissue identified and cauterized we dissected down to the fascia and made an incision in the fascia allowing [place] to elevate the fascia of the tensor muscle and exploited the interval between the rectus and the tensor fascia lata. A Hohmann retractor was then placed along the superior neck of the femur and a Cobra retractor along the inferior neck of the femur we teed the capsule starting out at the superior anterior aspect of the acetabulum going distally and made the T along the neck both leaflets of the T were tagged with #2 Ethibond suture. Cobra retractors were then placed along the inferior and superior  neck allowing [place] to perform a standard neck cut and removed the femoral head with a power corkscrew. We then placed a right angle Hohmann retractor along the anterior aspect of the acetabulum a spiked Cobra in the cotyloid notch and posteriorly a Muelller retractor. We then sequentially reamed up to a 47 mm basket reamer obtaining good coverage in all quadrants, verified by C-arm imaging. Under C-arm control with and hammered into place a 48 mm Pinnacle cup in 45 of abduction and 15 of anteversion. The cup seated nicely and required no supplemental screws. We then placed a central hole Eliminator and a 0 polyethylene liner. The foot was then externally rotated to 110, the HANA elevator was placed around the flare of the greater trochanter and the limb was extended and abducted delivering the proximal femur up into the wound. A medium Hohmann retractor  was placed over the greater trochanter and a Mueller retractor along the posterior femoral neck completing the exposure. We then performed releases superiorly and and inferiorly of the capsule going back to the pirformis fossa superiorly and to the lesser trochanter inferiorly. We then entered the proximal femur with the box cutting offset chisel followed by, a canal sounder, the chili pepper and broaching up to a 2 broach. This seated nicely and we reamed the calcar. A trial reduction was performed with a 1.5 mm 32 mm head.The limb lengths were excellent the hip was stable in 90 of external rotation. At this point the trial components removed and we hammered into place a # 2 Tri-Lock stem with G[employer] coating. This was a hi offset stem and a + 1.5 32 mm ceramic ball was then hammered into place the hip was reduced and final C-arm images obtained. The wound was thoroughly irrigated with normal saline solution. We repaired the ant capsule and the tensor fascia lot a with running 0 vicryl suture. the subcutaneous tissue was closed with 2-0 and 3-0 Vicryl suture followed by an Aquacil dressing. At this point the patient was awaken and transferred to hospital gurney without difficulty. The subcutaneous tissue with 0 and 2-0 undyed Vicryl suture and the skin with running  3-0 vicryl subcuticular suture. Aquacil dressing was applied. The patient was then unclamped, rolled supine, awaken extubated and taken to recovery room without difficulty in stable condition.   Emeterio Balke J 06/13/2016, 1:46 PM

## 2016-06-13 NOTE — Discharge Instructions (Signed)

## 2016-06-13 NOTE — Progress Notes (Signed)
Pt had 12 oz. Water today at 7:30AM---Dr. Hodierne made aware.

## 2016-06-13 NOTE — Transfer of Care (Signed)
Immediate Anesthesia Transfer of Care Note  Patient: Emma Wagner  Procedure(s) Performed: Procedure(s): TOTAL HIP ARTHROPLASTY ANTERIOR APPROACH (Right)  Patient Location: PACU  Anesthesia Type:General  Level of Consciousness: awake and alert   Airway & Oxygen Therapy: Patient Spontanous Breathing and Patient connected to face mask oxygen  Post-op Assessment: Report given to RN and Post -op Vital signs reviewed and stable  Post vital signs: Reviewed and stable  Last Vitals:  Vitals:   06/13/16 1053  BP: 138/76  Pulse: 74  Resp: 18  Temp: 37.1 C    Last Pain:  Vitals:   06/13/16 1053  TempSrc: Oral  PainSc:       Patients Stated Pain Goal: 1 ([id number])  Complications: No apparent anesthesia complications

## 2016-06-13 NOTE — Interval H&P Note (Signed)
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 After consideration of risks, benefits and other options for treatment, the patient has consented to  Procedure(s): TOTAL HIP ARTHROPLASTY ANTERIOR APPROACH (Right) as a surgical intervention .  The patient's history has been reviewed, patient examined, no change in status, stable for surgery.  I have reviewed the patient's chart and labs.  Questions were answered to the patient's satisfaction.     Kerin Salen

## 2016-06-13 NOTE — Anesthesia Preprocedure Evaluation (Signed)
Anesthesia Evaluation  Patient identified by MRN, date of birth, ID band Patient awake    Reviewed: Allergy & Precautions, NPO status , Patient's Chart, lab work & pertinent test results  History of Anesthesia Complications (+) PONV  Airway Mallampati: II   Neck ROM: full    Dental   Pulmonary former smoker,    breath sounds clear to auscultation       Cardiovascular  Rhythm:regular Rate:Normal     Neuro/Psych  Headaches, PSYCHIATRIC DISORDERS Anxiety Depression    GI/Hepatic GERD  ,  Endo/Other    Renal/GU stones     Musculoskeletal  (+) Arthritis ,   Abdominal   Peds  Hematology   Anesthesia Other Findings   Reproductive/Obstetrics                             Anesthesia Physical Anesthesia Plan  ASA: II  Anesthesia Plan: MAC and Spinal   Post-op Pain Management:    Induction: Intravenous  Airway Management Planned: Simple Face Mask  Additional Equipment:   Intra-op Plan:   Post-operative Plan:   Informed Consent: I have reviewed the patients History and Physical, chart, labs and discussed the procedure including the risks, benefits and alternatives for the proposed anesthesia with the patient or authorized representative who has indicated his/her understanding and acceptance.     Plan Discussed with: CRNA, Anesthesiologist and Surgeon  Anesthesia Plan Comments:         Anesthesia Quick Evaluation

## 2016-06-13 NOTE — Anesthesia Postprocedure Evaluation (Signed)
Anesthesia Post Note  Patient: Emma Wagner  Procedure(s) Performed: Procedure(s) (LRB): TOTAL HIP ARTHROPLASTY ANTERIOR APPROACH (Right)  Patient location during evaluation: PACU Anesthesia Type: Spinal Level of consciousness: oriented and awake and alert Pain management: pain level controlled Vital Signs Assessment: post-procedure vital signs reviewed and stable Respiratory status: spontaneous breathing, respiratory function stable and patient connected to nasal cannula oxygen Cardiovascular status: blood pressure returned to baseline and stable Postop Assessment: no headache and no backache Anesthetic complications: no    Last Vitals:  Vitals:   06/13/16 1053 06/13/16 1410  BP: 138/76   Pulse: 74 (!) 59  Resp: 18 12  Temp: 37.1 C 36.1 C    Last Pain:  Vitals:   06/13/16 1053  TempSrc: Oral  PainSc:                  [place] S

## 2016-06-13 NOTE — Anesthesia Procedure Notes (Signed)
Spinal  Patient location during procedure: OR Start time: 06/13/2016 11:40 AM End time: 06/13/2016 11:47 AM Staffing Anesthesiologist: Marcie Bal, ADAM Performed: anesthesiologist  Preanesthetic Checklist Completed: patient identified, site marked, surgical consent, pre-op evaluation, timeout performed, IV checked, risks and benefits discussed and monitors and equipment checked Spinal Block Patient position: sitting Prep: Betadine and site prepped and draped Patient monitoring: heart rate, cardiac monitor, continuous pulse ox and blood pressure Approach: midline Location: L4-5 Injection technique: single-shot Needle Needle type: Pencan  Needle gauge: 24 G Needle length: 9 cm Assessment Sensory level: T6 Additional Notes Pt tolerated the procedure well.

## 2016-06-14 ENCOUNTER — Encounter (HOSPITAL_COMMUNITY): Payer: Self-pay | Admitting: Orthopedic Surgery

## 2016-06-14 LAB — CBC
HEMATOCRIT: 32.1 % — AB (ref 36.0–46.0)
Hemoglobin: 10.3 g/dL — ABNORMAL LOW (ref 12.0–15.0)
MCH: 30.8 pg (ref 26.0–34.0)
MCHC: 32.1 g/dL (ref 30.0–36.0)
MCV: 96.1 fL (ref 78.0–100.0)
PLATELETS: 231 10*3/uL (ref 150–400)
RBC: 3.34 MIL/uL — ABNORMAL LOW (ref 3.87–5.11)
RDW: 13.2 % (ref 11.5–15.5)
WBC: 16.3 10*3/uL — ABNORMAL HIGH (ref 4.0–10.5)

## 2016-06-14 NOTE — Care Management Note (Signed)
Case Management Note  Patient Details  Name: Emma Wagner MRN: [id number] Date of Birth: 12/02/1970  Subjective/Objective:     45 yr old female s/p right total hip arthroplasty.               Action/Plan: Case manager spoke with patient concerning [place] and DME needs. Patient was preoperatively setup with [place], no changes. She states she has rolling walker and 3in1 from previous surgery. Patient request the same therapist she had with her left hip surgery, remembers her first name only-Mary. CM contacted Edwinna Areola, [facility] liaison with this request. Patient will have family support at discharge.    Expected Discharge Date:   06/15/16               Expected Discharge Plan:  [place]  In-House Referral:     Discharge planning Services  CM Consult  Post Acute Care Choice:  Home Health Choice offered to:  Patient  DME Arranged:  N/A (PAtient has RW/3in1) DME Agency:  NA  HH Arranged:    [place] Agency:  [employer] (now [facility])  Status of Service:  Completed, signed off  If discussed at [employer], dates discussed:    Additional Comments:  Ninfa Meeker, RN 06/14/2016, 3:31 PM

## 2016-06-14 NOTE — Progress Notes (Signed)
Patient has dizziness when standing with physical therapy. Orthostatic BP when sitting was 106/77, standing her blood pressure would not measure. When sitting BP was 98/43. Physician notified. Bolus of 500 started, Nurse will continue to monitor Emma Hemric A Meriem Lemieux, RN

## 2016-06-14 NOTE — Evaluation (Signed)
Occupational Therapy Evaluation and Discharge Patient Details Name: Emma Wagner MRN: [id number] DOB: 1971-01-20 Today's Date: 06/14/2016    History of Present Illness 45 y/o female total hip arthroplasty direct anterior approach. Pt has a past medical history of Anxiety; Arthritis; Borderline type 2 diabetes mellitus (since 2013); Bursitis; Chronic kidney disease; Constipation; Depression; Frequent urination; GERD (gastroesophageal reflux disease); Headache; Hemorrhoids; History of kidney stones; Pneumonia; and PONV (postoperative nausea and vomiting).   Clinical Impression   PTA Pt independent in ADL and mobility. Pt currently min guard for ADL. Pt familiar with compensatory strategies from previous surgery in March. Pt has all AE with the exception of sock donner. Pt practiced with all AE and has no further questions or concerns. Pt's biggest concern is stairs. Pt expressed concern about wanting to remain in hospital until Saturday to maximize mobility with PT. No further questions or concerns from OT perspective. OT to sign off.     Follow Up Recommendations  No OT follow up;Supervision - Intermittent    Equipment Recommendations  None recommended by OT    Recommendations for Other Services       Precautions / Restrictions Precautions Precautions: Fall Restrictions Weight Bearing Restrictions: Yes RLE Weight Bearing: Weight bearing as tolerated      Mobility Bed Mobility Overal bed mobility: Needs Assistance Bed Mobility: Supine to Sit;Sit to Supine     Supine to sit: Min assist;HOB elevated Sit to supine: Min assist   General bed mobility comments: Pt OOB in recliner upon arrival  Transfers Overall transfer level: Needs assistance Equipment used: Rolling walker (2 wheeled) Transfers: Sit to/from [employer] Sit to Stand: Min guard Stand pivot transfers: Min guard       General transfer comment: pt demonstrated safe technique, increased time  required and min guard for safety    Balance Overall balance assessment: Needs assistance Sitting-balance support: No upper extremity supported;Feet supported Sitting balance-Leahy Scale: Good     Standing balance support: Bilateral upper extremity supported;During functional activity Standing balance-Leahy Scale: Poor                              ADL Overall ADL's : Needs assistance/impaired Eating/Feeding: Modified independent;Sitting           Lower Body Bathing: Supervison/ safety;Sit to/from stand Lower Body Bathing Details (indicate cue type and reason): Educated in long handle sponge. Pt already has and uses Upper Body Dressing : Modified independent;Sitting   Lower Body Dressing: Supervision/safety;With adaptive equipment;Sit to/from stand Lower Body Dressing Details (indicate cue type and reason): practiced LB dressing with AE (grabber/reacher, sock donner) Pt demonstrated competence and verballly acknowledged understanding. Pt also will have family around 24 hours to assist Toilet Transfer: Min Designer, jewellery Details (indicate cue type and reason): simulated with recliner       Tub/Shower Transfer Details (indicate cue type and reason): verbally discussed shower transfer, Pt verbalized that she is familiar and comfortable with transfer from previous surgery   General ADL Comments: Pt familiar and competent in compensatory strategies from previous surgery in March     Vision     Perception     Praxis      Pertinent Vitals/Pain Pain Assessment: 0-10 Pain Score: 4  Pain Location: R hip Pain Descriptors / Indicators: Aching;Grimacing;Guarding Pain Intervention(s): Monitored during session;Repositioned;Premedicated before session     Hand Dominance Right   Extremity/Trunk Assessment Upper Extremity Assessment Upper Extremity Assessment: Overall  WFL for tasks assessed   Lower Extremity Assessment Lower Extremity Assessment:  RLE deficits/detail;Defer to PT evaluation   Cervical / Trunk Assessment Cervical / Trunk Assessment: Normal   Communication Communication Communication: No difficulties   Cognition Arousal/Alertness: Awake/alert Behavior During Therapy: WFL for tasks assessed/performed Overall Cognitive Status: Within Functional Limits for tasks assessed                     General Comments       Exercises      Shoulder Instructions      Home Living Family/patient expects to be discharged to:: Private residence Living Arrangements: Spouse/significant other;Children Available Help at Discharge: Family;Available 24 hours/day Type of Home: House Home Access: Stairs to enter [employer] of Steps: small curb Entrance Stairs-Rails: None Home Layout: Two level;1/2 bath on main level;Bed/bath upstairs Alternate Level Stairs-Number of Steps: flight Alternate Level Stairs-Rails: Right Bathroom Shower/Tub: Walk-in shower;Door   [employer] Toilet: Standard Bathroom Accessibility: Yes How Accessible: Accessible via walker Home Equipment: Walker - 2 wheels;Bedside commode          Prior Functioning/Environment Level of Independence: Independent                 OT Problem List: Decreased range of motion;Decreased activity tolerance;Pain   OT Treatment/Interventions:      OT Goals(Current goals can be found in the care plan section) Acute Rehab OT Goals Patient Stated Goal: return home on Saturday OT Goal Formulation: With patient Time For Goal Achievement: 06/21/16 Potential to Achieve Goals: Good  OT Frequency:     Barriers to D/C:            Co-evaluation              End of Session Equipment Utilized During Treatment: Gait belt;Rolling walker Nurse Communication: Mobility status  Activity Tolerance: Patient tolerated treatment well Patient left: in chair;with call bell/phone within reach   Time: 1350-1418 OT Time Calculation (min): 28  min Charges:  OT General Charges $OT Visit: 1 Procedure OT Evaluation $OT Eval Low Complexity: 1 Procedure OT Treatments $Self Care/Home Management : 8-22 mins G-Codes:    Timotea Roughton Lucinda Spells 06/22/16, 4:49 PM Hulda Humphrey OTR/L [phone]

## 2016-06-14 NOTE — Progress Notes (Signed)
Physical Therapy Treatment Patient Details Name: Emma Wagner MRN: [id number] DOB: 23-Jul-1971 Today's Date: 06/14/2016    History of Present Illness 45 y/o female total hip arthroplasty direct anterior approach. Pt has a past medical history of Anxiety; Arthritis; Borderline type 2 diabetes mellitus (since 2013); Bursitis; Chronic kidney disease; Constipation; Depression; Frequent urination; GERD (gastroesophageal reflux disease); Headache; Hemorrhoids; History of kidney stones; Pneumonia; and PONV (postoperative nausea and vomiting).    PT Comments    Pt presented supine in bed with HOB elevated, awake and willing to participate in therapy session. Pt making great progress since previous session and able to ambulate ~300' with RW and min guard for safety. Pt would continue to benefit from skilled physical therapy services at this time while admitted and after d/c to address her limitations in order to improve her overall safety and independence with functional mobility.   Follow Up Recommendations  Home health PT;Supervision/Assistance - 24 hour     Equipment Recommendations  None recommended by PT    Recommendations for Other Services       Precautions / Restrictions Precautions Precautions: Fall Restrictions Weight Bearing Restrictions: Yes RLE Weight Bearing: Weight bearing as tolerated    Mobility  Bed Mobility Overal bed mobility: Needs Assistance Bed Mobility: Supine to Sit;Sit to Supine     Supine to sit: Min assist;HOB elevated Sit to supine: Min assist   General bed mobility comments: pt required increased time, use of bed rails and min A with R LE movement   Transfers Overall transfer level: Needs assistance Equipment used: Rolling walker (2 wheeled) Transfers: Sit to/from Stand Sit to Stand: Min guard         General transfer comment: pt demonstrated safe technique, increased time required and min guard for safety  Ambulation/Gait Ambulation/Gait  assistance: Min guard Ambulation Distance (Feet): 300 Feet Assistive device: Rolling walker (2 wheeled) Gait Pattern/deviations: Step-to pattern;Step-through pattern;Decreased step length - left;Decreased stance time - right;Decreased weight shift to right Gait velocity: decreased Gait velocity interpretation: Below normal speed for age/gender General Gait Details: pt demonstrated safe technique with RW   Stairs            Wheelchair Mobility    Modified Rankin (Stroke Patients Only)       Balance Overall balance assessment: Needs assistance Sitting-balance support: Feet supported;No upper extremity supported Sitting balance-Leahy Scale: Good     Standing balance support: During functional activity;Single extremity supported Standing balance-Leahy Scale: Poor                      Cognition Arousal/Alertness: Awake/alert Behavior During Therapy: WFL for tasks assessed/performed Overall Cognitive Status: Within Functional Limits for tasks assessed                      Exercises Total Joint Exercises Hip ABduction/ADduction: Strengthening;Right;10 reps;Supine Straight Leg Raises: AROM;Strengthening;Right;Other reps (comment);Supine (x1 rep) Long Arc Quad: Strengthening;Right;10 reps;Seated Marching in Standing: Strengthening;Both;20 reps;Seated    General Comments        Pertinent Vitals/Pain Pain Assessment: 0-10 Pain Score: 4  Pain Location: R hip Pain Descriptors / Indicators: Aching;Grimacing;Guarding Pain Intervention(s): Monitored during session;Repositioned;Premedicated before session    Home Living Family/patient expects to be discharged to:: (P) Private residence Living Arrangements: (P) Spouse/significant other;Children Available Help at Discharge: (P) Family;Available 24 hours/day Type of Home: (P) House Home Access: (P) Stairs to enter Entrance Stairs-Rails: (P) None Home Layout: (P) Two level;1/2 bath on main level;Bed/bath  upstairs  Home Equipment: (P) Walker - 2 wheels;Bedside commode      Prior Function Level of Independence: (P) Independent          PT Goals (current goals can now be found in the care plan section) Acute Rehab PT Goals Patient Stated Goal: return home PT Goal Formulation: With patient Time For Goal Achievement: 06/21/16 Potential to Achieve Goals: Good Progress towards PT goals: Progressing toward goals    Frequency    7X/week      PT Plan Current plan remains appropriate    Co-evaluation             End of Session Equipment Utilized During Treatment: Gait belt Activity Tolerance: Patient tolerated treatment well Patient left: in bed;with call bell/phone within reach     Time: 1549-1630 PT Time Calculation (min) (ACUTE ONLY): 41 min  Charges:  $Gait Training: 23-37 mins $Therapeutic Exercise: 8-22 mins                    G CodesClearnce Sorrel Shea Swalley 07-05-16, 4:45 PM Sherie Don, [place], DPT [phone]

## 2016-06-14 NOTE — Evaluation (Signed)
Physical Therapy Evaluation Patient Details Name: Emma Wagner MRN: [id number] DOB: 1970-10-16 Today's Date: 06/14/2016   History of Present Illness  45 y/o female total hip arthroplasty direct anterior approach. Pt has a past medical history of Anxiety; Arthritis; Borderline type 2 diabetes mellitus (since 2013); Bursitis; Chronic kidney disease; Constipation; Depression; Frequent urination; GERD (gastroesophageal reflux disease); Headache; Hemorrhoids; History of kidney stones; Pneumonia; and PONV (postoperative nausea and vomiting).  Clinical Impression  Pt very motivated and with good awareness of safety from previous THR, but overall mobility limited this session due to BPs.  Pt's BP supine 102/62, sitting 107/71, unable to get reading in standing, and sitting after standing 2 mins 92/43 with pt c/o lightheadedness and feeling flush.  Pt returned to bed and RN made aware of BPs.  Feel pt will make great progress once BPs stabilize.  Will continue to follow.      Follow Up Recommendations Home health PT;Supervision/Assistance - 24 hour    Equipment Recommendations  None recommended by PT    Recommendations for Other Services       Precautions / Restrictions Precautions Precautions: Fall Restrictions Weight Bearing Restrictions: Yes RLE Weight Bearing: Weight bearing as tolerated      Mobility  Bed Mobility Overal bed mobility: Needs Assistance Bed Mobility: Supine to Sit;Sit to Supine     Supine to sit: Min assist;HOB elevated Sit to supine: Min assist   General bed mobility comments: A with R LE only.  pt with heavy use of bed rails to A with bringing trunk up to sitting.    Transfers Overall transfer level: Needs assistance Equipment used: Rolling walker (2 wheeled) Transfers: Sit to/from Stand Sit to Stand: Min guard         General transfer comment: pt demonstrates safe technique, but limited by dizziness and lightheadedness in standing.  Machine unable to  read BP in standing, but upon returning to sitting BP found to be 92/43.    Ambulation/Gait                Stairs            Wheelchair Mobility    Modified Rankin (Stroke Patients Only)       Balance Overall balance assessment: Needs assistance Sitting-balance support: No upper extremity supported;Feet supported Sitting balance-Leahy Scale: Good     Standing balance support: Single extremity supported;Bilateral upper extremity supported;During functional activity Standing balance-Leahy Scale: Poor                               Pertinent Vitals/Pain Pain Assessment: 0-10 Pain Score: 6  Pain Location: R hip Pain Descriptors / Indicators: Aching;Grimacing;Guarding Pain Intervention(s): Premedicated before session;Monitored during session;Repositioned    Home Living Family/patient expects to be discharged to:: Private residence Living Arrangements: Spouse/significant other;Children Available Help at Discharge: Family;Available 24 hours/day Type of Home: House Home Access: Stairs to enter Entrance Stairs-Rails: None Entrance Stairs-Number of Steps: small curb Home Layout: Two level;1/2 bath on main level;Bed/bath upstairs Home Equipment: Walker - 2 wheels;Bedside commode      Prior Function Level of Independence: Independent               Hand Dominance   Dominant Hand: Right    Extremity/Trunk Assessment   Upper Extremity Assessment: Defer to OT evaluation           Lower Extremity Assessment: RLE deficits/detail RLE Deficits / Details: Sensation intact, Strength and  ROM functional, but limited by post-op pain.    Cervical / Trunk Assessment: Normal  Communication   Communication: No difficulties  Cognition Arousal/Alertness: Awake/alert Behavior During Therapy: WFL for tasks assessed/performed Overall Cognitive Status: Within Functional Limits for tasks assessed                      General Comments       Exercises     Assessment/Plan    PT Assessment Patient needs continued PT services  PT Problem List Decreased activity tolerance;Decreased balance;Decreased mobility;Decreased knowledge of use of DME;Pain;Cardiopulmonary status limiting activity          PT Treatment Interventions DME instruction;Gait training;Stair training;Functional mobility training;Therapeutic activities;Therapeutic exercise;Balance training;Patient/family education    PT Goals (Current goals can be found in the Care Plan section)  Acute Rehab PT Goals Patient Stated Goal: Home PT Goal Formulation: With patient Time For Goal Achievement: 06/21/16 Potential to Achieve Goals: Good    Frequency 7X/week   Barriers to discharge        Co-evaluation               End of Session Equipment Utilized During Treatment: Gait belt Activity Tolerance: Treatment limited secondary to medical complications (Comment) (Limited by low BP) Patient left: in bed;with call bell/phone within reach;with nursing/sitter in room Nurse Communication: Mobility status (BPs)         Time: [id number] PT Time Calculation (min) (ACUTE ONLY): 26 min   Charges:   PT Evaluation $PT Eval Moderate Complexity: 1 Procedure PT Treatments $Therapeutic Activity: 8-22 mins   PT G CodesCatarina Wagner, [place] [phone] 06/14/2016, 10:21 AM

## 2016-06-14 NOTE — Progress Notes (Signed)
Patient ID: Emma Wagner, female   DOB: Jun 09, 1971, 45 y.o.   MRN: [id number] PATIENT ID: Emma Wagner  MRN: [id number]  DOB/AGE:  28-Apr-1971 / 45 y.o.  1 Day Post-Op Procedure(s) (LRB): TOTAL HIP ARTHROPLASTY ANTERIOR APPROACH (Right)    PROGRESS NOTE Subjective: Patient is alert, oriented, no Nausea, no Vomiting, yes passing gas, . Taking PO well. Denies SOB, Chest or Calf Pain. Using Incentive Spirometer, PAS in place. Ambulate up in room independently last night without any difficulties. Patient reports pain as  2/10  .    Objective: Vital signs in last 24 hours: Vitals:   06/13/16 1718 06/13/16 2015 06/14/16 0000 06/14/16 0530  BP: 124/86 (!) 142/95 111/70 104/63  Pulse: 71 78 68 68  Resp: 14 16 16 18   Temp: 97.8 F (36.6 C) 97.7 F (36.5 C) 98.2 F (36.8 C) 98.6 F (37 C)  TempSrc: Oral Oral Oral Oral  SpO2: 100% 99% 100% 100%  Weight:      Height:          Intake/Output from previous day: I/O last 3 completed shifts: In: 2640 [P.O.:240; I.V.:2400] Out: 1000 [Urine:300; Blood:700]   Intake/Output this shift: No intake/output data recorded.   LABORATORY DATA:  Recent Labs  06/14/16 0608  WBC 16.3*  HGB 10.3*  HCT 32.1*  PLT 231    Examination: Neurologically intact ABD soft Neurovascular intact Sensation intact distally Intact pulses distally Dorsiflexion/Plantar flexion intact Incision: dressing C/D/I No cellulitis present Compartment soft} XR AP&Lat of hip shows well placed\fixed THA   Assessment:   1 Day Post-Op Procedure(s) (LRB): TOTAL HIP ARTHROPLASTY ANTERIOR APPROACH (Right) ADDITIONAL DIAGNOSIS:  Expected Acute Blood Loss Anemia, ADD  Plan: PT/OT WBAT, THA  DVT Prophylaxis: SCDx72 hrs, ASA 325 mg BID x 2 weeks  DISCHARGE PLAN: Home  DISCHARGE NEEDS: HHPT, Walker and 3-in-1 comode seat

## 2016-06-15 LAB — CBC
HEMATOCRIT: 28.2 % — AB (ref 36.0–46.0)
HEMOGLOBIN: 9.1 g/dL — AB (ref 12.0–15.0)
MCH: 31 pg (ref 26.0–34.0)
MCHC: 32.3 g/dL (ref 30.0–36.0)
MCV: 95.9 fL (ref 78.0–100.0)
Platelets: 195 10*3/uL (ref 150–400)
RBC: 2.94 MIL/uL — AB (ref 3.87–5.11)
RDW: 13.5 % (ref 11.5–15.5)
WBC: 12.4 10*3/uL — ABNORMAL HIGH (ref 4.0–10.5)

## 2016-06-15 MED ORDER — CELECOXIB 200 MG PO CAPS
200.0000 mg | ORAL_CAPSULE | Freq: Two times a day (BID) | ORAL | 0 refills | Status: DC
Start: 2016-06-15 — End: 2018-04-07

## 2016-06-15 MED ORDER — GABAPENTIN 300 MG PO CAPS
300.0000 mg | ORAL_CAPSULE | Freq: Three times a day (TID) | ORAL | 0 refills | Status: DC
Start: 2016-06-15 — End: 2018-11-21

## 2016-06-15 NOTE — Progress Notes (Signed)
Physical Therapy Treatment Patient Details Name: Emma Wagner MRN: [id number] DOB: [birth year] Today's Date: 06/15/2016    History of Present Illness 45 y/o female total hip arthroplasty direct anterior approach. Pt has a past medical history of Anxiety; Arthritis; Borderline type 2 diabetes mellitus (since 2013); Bursitis; Chronic kidney disease; Constipation; Depression; Frequent urination; GERD (gastroesophageal reflux disease); Headache; Hemorrhoids; History of kidney stones; Pneumonia; and PONV (postoperative nausea and vomiting).    PT Comments    Pt presented supine in bed with HOB elevated, awake and willing to participate in therapy session. Pt with reports of increased pain in R hip as compared to yesterday. Pt continuing to move very well; however, requesting to defer stair training to next session. Pt would continue to benefit from skilled physical therapy services at this time while admitted and after d/c to address her limitations in order to improve her overall safety and independence with functional mobility.   Follow Up Recommendations  Home health PT;Supervision/Assistance - 24 hour     Equipment Recommendations  None recommended by PT    Recommendations for Other Services       Precautions / Restrictions Precautions Precautions: Fall Restrictions Weight Bearing Restrictions: Yes RLE Weight Bearing: Weight bearing as tolerated    Mobility  Bed Mobility Overal bed mobility: Needs Assistance Bed Mobility: Supine to Sit;Sit to Supine     Supine to sit: Min assist;HOB elevated Sit to supine: Min assist   General bed mobility comments: pt required min A with movement of R LE  Transfers Overall transfer level: Needs assistance Equipment used: Rolling walker (2 wheeled) Transfers: Sit to/from Stand Sit to Stand: Min guard         General transfer comment: pt demonstrated safe technique, increased time required and min guard for  safety  Ambulation/Gait Ambulation/Gait assistance: Min guard Ambulation Distance (Feet): 150 Feet (150' x3 with standing rest breaks in between) Assistive device: Rolling walker (2 wheeled) Gait Pattern/deviations: Step-to pattern;Step-through pattern;Decreased step length - left;Decreased stance time - right;Decreased weight shift to right Gait velocity: decreased Gait velocity interpretation: Below normal speed for age/gender General Gait Details: pt demonstrated safe technique with RW   Stairs            Wheelchair Mobility    Modified Rankin (Stroke Patients Only)       Balance Overall balance assessment: Needs assistance Sitting-balance support: Feet supported;No upper extremity supported Sitting balance-Leahy Scale: Good     Standing balance support: During functional activity;Bilateral upper extremity supported Standing balance-Leahy Scale: Poor                      Cognition Arousal/Alertness: Awake/alert Behavior During Therapy: WFL for tasks assessed/performed Overall Cognitive Status: Within Functional Limits for tasks assessed                      Exercises Total Joint Exercises Hip ABduction/ADduction: AROM;Strengthening;Right;10 reps;Standing Marching in Standing: Strengthening;Both;20 reps;Standing Standing Hip Extension: AROM;Strengthening;Right;10 reps;Standing    General Comments        Pertinent Vitals/Pain Pain Assessment: Faces Faces Pain Scale: Hurts even more Pain Location: R hip Pain Descriptors / Indicators: Sore;Grimacing;Guarding Pain Intervention(s): Monitored during session;Repositioned;Ice applied    Home Living                      Prior Function            PT Goals (current goals can now be found in the  care plan section) Acute Rehab PT Goals Patient Stated Goal: return home on Saturday PT Goal Formulation: With patient Time For Goal Achievement: 06/21/16 Potential to Achieve Goals:  Good Progress towards PT goals: Progressing toward goals    Frequency    7X/week      PT Plan Current plan remains appropriate    Co-evaluation             End of Session Equipment Utilized During Treatment: Gait belt Activity Tolerance: Patient limited by pain Patient left: in bed;with call bell/phone within reach     Time: 0924-0946 PT Time Calculation (min) (ACUTE ONLY): 22 min  Charges:  $Gait Training: 8-22 mins                    G CodesClearnce Sorrel Kiyomi Pallo July 07, 2016, 10:33 AM Sherie Don, PT, DPT [phone]

## 2016-06-15 NOTE — Progress Notes (Signed)
Physical Therapy Treatment Patient Details Name: Emma Wagner MRN: [id number] DOB: 1971/03/16 Today's Date: 06/15/2016    History of Present Illness 45 y/o female total hip arthroplasty direct anterior approach. Pt has a past medical history of Anxiety; Arthritis; Borderline type 2 diabetes mellitus (since 2013); Bursitis; Chronic kidney disease; Constipation; Depression; Frequent urination; GERD (gastroesophageal reflux disease); Headache; Hemorrhoids; History of kidney stones; Pneumonia; and PONV (postoperative nausea and vomiting).    PT Comments    Pt presented supine in bed with HOB elevated, awake and willing to participate in therapy session. Pt limited this session secondary to reports of dizziness during ambulation which required a standing rest break and during stair training which required a sitting rest break to subside. Pt only able to ascend and descend three stairs at this time. If pt were to d/c home today she stated that she would have to go to her mother's house, which has six stairs and no hand rail. Pt was unable to practice ascending and descending stairs with use of RW this session secondary to dizziness and reports of nausea at end of session. Pt would benefit from additional stair training prior to d/c home for safety. Pt would continue to benefit from skilled physical therapy services at this time while admitted and after d/c to address her limitations in order to improve her overall safety and independence with functional mobility.   Follow Up Recommendations  Home health PT;Supervision/Assistance - 24 hour     Equipment Recommendations  None recommended by PT    Recommendations for Other Services       Precautions / Restrictions Precautions Precautions: Fall Restrictions Weight Bearing Restrictions: Yes RLE Weight Bearing: Weight bearing as tolerated    Mobility  Bed Mobility Overal bed mobility: Needs Assistance Bed Mobility: Supine to Sit;Sit to  Supine     Supine to sit: Min assist;HOB elevated Sit to supine: Min assist   General bed mobility comments: pt required min A with movement of R LE  Transfers Overall transfer level: Needs assistance Equipment used: Rolling walker (2 wheeled) Transfers: Sit to/from Stand Sit to Stand: Supervision         General transfer comment: pt demonstrated safe technique with supervision  Ambulation/Gait Ambulation/Gait assistance: Min guard Ambulation Distance (Feet): 20 Feet (20' x2 with stair training and sitting rest break in between) Assistive device: Rolling walker (2 wheeled) Gait Pattern/deviations: Step-to pattern;Step-through pattern;Decreased step length - left;Decreased stance time - right;Decreased weight shift to right;Antalgic Gait velocity: decreased Gait velocity interpretation: Below normal speed for age/gender General Gait Details: pt demonstrated safe technique with RW   Stairs Stairs: Yes Stairs assistance: Min assist Stair Management: One rail Right;Step to pattern;Sideways Number of Stairs: 3 General stair comments: Pt ascended with L LE leading to ascend and R LE leading to descend. Pt became very dizzy during stair training and required a sitting rest break.   Wheelchair Mobility    Modified Rankin (Stroke Patients Only)       Balance Overall balance assessment: Needs assistance Sitting-balance support: Feet supported;No upper extremity supported Sitting balance-Leahy Scale: Good     Standing balance support: During functional activity;Single extremity supported Standing balance-Leahy Scale: Poor                      Cognition Arousal/Alertness: Awake/alert Behavior During Therapy: Agitated;Anxious Overall Cognitive Status: Within Functional Limits for tasks assessed  Exercises Total Joint Exercises Quad Sets: AROM;Strengthening;Right;10 reps;Supine Hip ABduction/ADduction: AAROM;Right;10  reps;Supine Long Arc Quad: AROM;Strengthening;20 reps;Seated    General Comments        Pertinent Vitals/Pain Pain Assessment: 0-10 Pain Score: 4  Pain Location: R hip Pain Descriptors / Indicators: Sore Pain Intervention(s): Monitored during session;Repositioned;Ice applied   BP sitting after stair training = 126/74    Home Living                      Prior Function            PT Goals (current goals can now be found in the care plan section) Acute Rehab PT Goals Patient Stated Goal: return home on Saturday PT Goal Formulation: With patient Time For Goal Achievement: 06/21/16 Potential to Achieve Goals: Good Progress towards PT goals: Progressing toward goals    Frequency    7X/week      PT Plan Current plan remains appropriate    Co-evaluation             End of Session Equipment Utilized During Treatment: Gait belt Activity Tolerance: Patient limited by pain;Other (comment) (pt limited secondary to dizziness) Patient left: in bed;with call bell/phone within reach     Time: 1545-1620 PT Time Calculation (min) (ACUTE ONLY): 35 min  Charges:  $Gait Training: 8-22 mins $Therapeutic Exercise: 8-22 mins                    G CodesClearnce Sorrel Yassir Enis 06-19-16, 4:53 PM Sherie Don, [place], DPT [phone]

## 2016-06-15 NOTE — Discharge Summary (Signed)
Patient ID: Emma Wagner MRN: [id number] DOB/AGE: Aug 07, 1971 45 y.o.  Admit date: 06/13/2016 Discharge date: 06/15/2016  Admission Diagnoses:  Active Problems:   Arthritis of right hip   Discharge Diagnoses:  Same  Past Medical History:  Diagnosis Date  . Anxiety    add also  . Arthritis   . Borderline type 2 diabetes mellitus since 2013   -lost weight and quit smoking and is no longer PRE diabetic  . Bursitis    bilateral hips, more on right hip  . Chronic kidney disease    h/o kidney stone 2016  . Constipation   . Depression   . Frequent urination    while kidney stone evident  . GERD (gastroesophageal reflux disease)   . Headache    "every blue moon. not often"  . Hemorrhoids   . History of kidney stones   . Pneumonia   . PONV (postoperative nausea and vomiting)    nausea with appendectomy and with hip replacement in March 2017    Surgeries: Procedure(s): TOTAL HIP ARTHROPLASTY ANTERIOR APPROACH on 06/13/2016   Consultants:   Discharged Condition: Improved  Hospital Course: Emma Wagner is an 45 y.o. female who was admitted 06/13/2016 for operative treatment of<principal problem not specified>. Patient has severe unremitting pain that affects sleep, daily activities, and work/hobbies. After pre-op clearance the patient was taken to the operating room on 06/13/2016 and underwent  Procedure(s): TOTAL HIP ARTHROPLASTY ANTERIOR APPROACH.    Patient was given perioperative antibiotics: Anti-infectives    Start     Dose/Rate Route Frequency Ordered Stop   06/13/16 0554  ceFAZolin (ANCEF) IVPB 2g/100 mL premix     2 g 200 mL/hr over 30 Minutes Intravenous On call to O.R. 06/13/16 0554 06/13/16 1140       Patient was given sequential compression devices, early ambulation, and chemoprophylaxis to prevent DVT.  Patient benefited maximally from hospital stay and there were no complications.    Recent vital signs: Patient Vitals for the past 24 hrs:  BP Temp  Temp src Pulse Resp SpO2  06/15/16 0540 112/60 98.2 F (36.8 C) Oral 73 18 100 %  06/14/16 2030 128/77 98.7 F (37.1 C) Oral 85 17 100 %  06/14/16 1800 (!) 125/98 98.8 F (37.1 C) Oral - - -  06/14/16 1329 (!) 144/93 99.4 F (37.4 C) Oral 87 16 98 %  06/14/16 1035 106/88 - - - - -  06/14/16 1030 120/66 - - - - -  06/14/16 1000 106/77 - - 77 - 99 %     Recent laboratory studies:  Recent Labs  06/14/16 0608  WBC 16.3*  HGB 10.3*  HCT 32.1*  PLT 231     Discharge Medications:     Medication List    STOP taking these medications   ibuprofen 800 MG tablet Commonly known as:  ADVIL,MOTRIN     TAKE these medications   amphetamine-dextroamphetamine 20 MG tablet Commonly known as:  ADDERALL Take 20 mg by mouth 2 (two) times daily.   aspirin EC 325 MG tablet Take 1 tablet (325 mg total) by mouth 2 (two) times daily.   BENEFIBER PO Take 10 mLs by mouth as directed. 2 Teaspoonfuls into 4-8 ounces of liquid daily   buPROPion 300 MG 24 hr tablet Commonly known as:  WELLBUTRIN XL Take 300 mg by mouth daily with breakfast.   cefdinir 300 MG capsule Commonly known as:  OMNICEF Take 300 mg by mouth 2 (two) times daily. 10 day  therapy course patient to complete 06/01/16.   celecoxib 200 MG capsule Commonly known as:  CELEBREX Take 1 capsule (200 mg total) by mouth every 12 (twelve) hours.   cyanocobalamin 1000 MCG tablet Take 1,000 mcg by mouth daily.   diphenhydramine-acetaminophen 25-500 MG Tabs tablet Commonly known as:  TYLENOL PM Take 1-2 tablets by mouth at bedtime.   FLUoxetine 40 MG capsule Commonly known as:  PROZAC Take 40 mg by mouth daily with breakfast.   gabapentin 300 MG capsule Commonly known as:  NEURONTIN Take 1 capsule (300 mg total) by mouth 3 (three) times daily.   guaiFENesin 600 MG 12 hr tablet Commonly known as:  MUCINEX Take 600 mg by mouth 2 (two) times daily.   MAGNESIUM CITRATE PO Take 150 mg by mouth 4 (four) times daily.    methocarbamol 500 MG tablet Commonly known as:  ROBAXIN Take 1 tablet (500 mg total) by mouth 2 (two) times daily with a meal.   multivitamin with minerals Tabs tablet Take 1 tablet by mouth daily.   omeprazole 20 MG capsule Commonly known as:  PRILOSEC Take 20 mg by mouth daily.   OVER THE COUNTER MEDICATION Take 5 capsules by mouth 3 (three) times daily. OTC. Intestinal Formula   oxyCODONE-acetaminophen 5-325 MG tablet Commonly known as:  ROXICET Take 1 tablet by mouth every 4 (four) hours as needed.       Diagnostic Studies: Dg C-arm 1-60 Min  Result Date: 06/13/2016 CLINICAL DATA:  Right hip arthroplasty. EXAM: DG C-ARM 61-120 MIN COMPARISON:  10/31/2015. FINDINGS: Total right hip replacement. Hardware intact. Anatomic alignment. 0 minutes 24 seconds fluoroscopy time. Two images obtained. IMPRESSION: Total right hip replacement with good anatomic alignment. Electronically Signed   By: Marcello Moores  Register   On: 06/13/2016 13:24   Dg Hip Operative Unilat W Or W/o Pelvis Right  Result Date: 06/13/2016 CLINICAL DATA:  Total right hip replacement. EXAM: OPERATIVE RIGHT HIP (WITH PELVIS IF PERFORMED) 2 VIEWS TECHNIQUE: Fluoroscopic spot image(s) were submitted for interpretation post-operatively. COMPARISON:  10/31/2015 . FINDINGS: Total right hip replacement. Hardware intact. Anatomic alignment. 0 minutes 24 seconds fluoroscopy time obtained . 2 images obtained. IMPRESSION: Total right hip replacement. Electronically Signed   By: Marcello Moores  Register   On: 06/13/2016 13:25    Disposition: 01-Home or Self Care  Discharge Instructions    Call MD / Call 911    Complete by:  As directed    If you experience chest pain or shortness of breath, CALL 911 and be transported to the hospital emergency room.  If you develope a fever above 101 F, pus (white drainage) or increased drainage or redness at the wound, or calf pain, call your surgeon's office.   Constipation Prevention    Complete by:   As directed    Drink plenty of fluids.  Prune juice may be helpful.  You may use a stool softener, such as Colace (over the counter) 100 mg twice a day.  Use MiraLax (over the counter) for constipation as needed.   Diet - low sodium heart healthy    Complete by:  As directed    Driving restrictions    Complete by:  As directed    No driving for 2 weeks   Follow the hip precautions as taught in Physical Therapy    Complete by:  As directed    Increase activity slowly as tolerated    Complete by:  As directed    Patient may shower  Complete by:  As directed    You may shower without a dressing once there is no drainage.  Do not wash over the wound.  If drainage remains, cover wound with plastic wrap and then shower.      Follow-up Information    Kerin Salen, MD Follow up in 2 week(s).   Specialty:  Orthopedic Surgery Contact information: [place] [address] [address] .   Why:  Someone from [facility] will contact you to arrange start date and time for therapy. Contact information: [place] [address] [phone]            Signed: Theodosia Quay 06/15/2016, 8:27 AM

## 2016-06-15 NOTE — Progress Notes (Signed)
PATIENT ID: Emma Wagner  MRN: [id number]  DOB/AGE:  1970/12/20 / 45 y.o.  2 Days Post-Op Procedure(s) (LRB): TOTAL HIP ARTHROPLASTY ANTERIOR APPROACH (Right)    PROGRESS NOTE Subjective: Patient is alert, oriented, no Nausea, no Vomiting, yes passing gas, . Taking PO well. Denies SOB, Chest or Calf Pain. Using Incentive Spirometer, PAS in place. Ambulate WBAT with pt walking 300 ft with therapy Patient reports pain as  5/10  .    Objective: Vital signs in last 24 hours: Vitals:   06/14/16 1329 06/14/16 1800 06/14/16 2030 06/15/16 0540  BP: (!) 144/93 (!) 125/98 128/77 112/60  Pulse: 87  85 73  Resp: 16  17 18   Temp: 99.4 F (37.4 C) 98.8 F (37.1 C) 98.7 F (37.1 C) 98.2 F (36.8 C)  TempSrc: Oral Oral Oral Oral  SpO2: 98%  100% 100%  Weight:      Height:          Intake/Output from previous day: I/O last 3 completed shifts: In: 1960 [P.O.:960; I.V.:1000] Out: 150 [Urine:150]   Intake/Output this shift: No intake/output data recorded.   LABORATORY DATA:  Recent Labs  06/14/16 0608  WBC 16.3*  HGB 10.3*  HCT 32.1*  PLT 231    Examination: Neurologically intact Neurovascular intact Sensation intact distally Intact pulses distally Dorsiflexion/Plantar flexion intact Incision: dressing C/D/I No cellulitis present Compartment soft} XR AP&Lat of hip shows well placed\fixed THA  Assessment:   2 Days Post-Op Procedure(s) (LRB): TOTAL HIP ARTHROPLASTY ANTERIOR APPROACH (Right) ADDITIONAL DIAGNOSIS:  Expected Acute Blood Loss Anemia, ADD  Plan: PT/OT WBAT, THA  DVT Prophylaxis: SCDx72 hrs, ASA 325 mg BID x 2 weeks  DISCHARGE PLAN: Home, whne pt passes therapy goals, likely later today.  DISCHARGE NEEDS: HHPT, Walker and 3-in-1 comode seat

## 2016-06-16 DIAGNOSIS — D62 Acute posthemorrhagic anemia: Secondary | ICD-10-CM

## 2016-06-16 LAB — CBC
HEMATOCRIT: 26.6 % — AB (ref 36.0–46.0)
HEMOGLOBIN: 8.6 g/dL — AB (ref 12.0–15.0)
MCH: 30.9 pg (ref 26.0–34.0)
MCHC: 32.3 g/dL (ref 30.0–36.0)
MCV: 95.7 fL (ref 78.0–100.0)
Platelets: 186 10*3/uL (ref 150–400)
RBC: 2.78 MIL/uL — ABNORMAL LOW (ref 3.87–5.11)
RDW: 13.4 % (ref 11.5–15.5)
WBC: 11.6 10*3/uL — AB (ref 4.0–10.5)

## 2016-06-16 NOTE — Progress Notes (Signed)
Physical Therapy Treatment Patient Details Name: Emma Wagner MRN: [id number] DOB: [birth year] Today's Date: 06/16/2016    History of Present Illness 45 y/o female total hip arthroplasty direct anterior approach. Pt has a past medical history of Anxiety; Arthritis; Borderline type 2 diabetes mellitus (since 2013); Bursitis; Chronic kidney disease; Constipation; Depression; Frequent urination; GERD (gastroesophageal reflux disease); Headache; Hemorrhoids; History of kidney stones; Pneumonia; and PONV (postoperative nausea and vomiting).    PT Comments    Pt presented sitting OOB in recliner when PT entered room. Pt was awake and willing to participate in therapy session. Pt making good progress and successfully completed stair training during this session. Pt would continue to benefit from skilled physical therapy services at this time while admitted and after d/c to address her limitations in order to improve her overall safety and independence with functional mobility.   Follow Up Recommendations  Home health PT;Supervision/Assistance - 24 hour     Equipment Recommendations  None recommended by PT    Recommendations for Other Services       Precautions / Restrictions Precautions Precautions: Fall Restrictions Weight Bearing Restrictions: Yes RLE Weight Bearing: Weight bearing as tolerated    Mobility  Bed Mobility               General bed mobility comments: pt sitting OOB in recliner when PT entered room  Transfers Overall transfer level: Needs assistance Equipment used: Rolling walker (2 wheeled) Transfers: Sit to/from Stand Sit to Stand: Supervision         General transfer comment: pt demonstrated safe technique with supervision  Ambulation/Gait Ambulation/Gait assistance: Supervision Ambulation Distance (Feet): 50 Feet Assistive device: Rolling walker (2 wheeled) Gait Pattern/deviations: Step-through pattern;Decreased step length - left;Decreased stance  time - right;Decreased weight shift to right;Antalgic Gait velocity: decreased Gait velocity interpretation: Below normal speed for age/gender General Gait Details: pt demonstrated safe technique with RW   Stairs Stairs: Yes Stairs assistance: Min guard Stair Management: One rail Right;Step to pattern;Forwards Number of Stairs: 12 General stair comments: pt ascended with L LE leading and descended with R LE leading  Wheelchair Mobility    Modified Rankin (Stroke Patients Only)       Balance Overall balance assessment: Needs assistance Sitting-balance support: Feet supported;No upper extremity supported Sitting balance-Leahy Scale: Good     Standing balance support: During functional activity;No upper extremity supported Standing balance-Leahy Scale: Fair                      Cognition Arousal/Alertness: Awake/alert Behavior During Therapy: WFL for tasks assessed/performed Overall Cognitive Status: Within Functional Limits for tasks assessed                      Exercises      General Comments        Pertinent Vitals/Pain Pain Assessment: Faces Faces Pain Scale: Hurts little more Pain Location: R hip Pain Descriptors / Indicators: Burning;Grimacing;Guarding;Moaning Pain Intervention(s): Monitored during session;Repositioned;Ice applied    Home Living                      Prior Function            PT Goals (current goals can now be found in the care plan section) Acute Rehab PT Goals Patient Stated Goal: return home today PT Goal Formulation: With patient Time For Goal Achievement: 06/21/16 Potential to Achieve Goals: Good Progress towards PT goals: Progressing toward goals  Frequency    7X/week      PT Plan Current plan remains appropriate    Co-evaluation             End of Session Equipment Utilized During Treatment: Gait belt Activity Tolerance: Patient tolerated treatment well Patient left: in chair;with  call bell/phone within reach     Time: 0909-0922 PT Time Calculation (min) (ACUTE ONLY): 13 min  Charges:  $Gait Training: 8-22 mins                    G CodesClearnce Sorrel Idabelle Mcpeters Jul 15, 2016, 10:14 AM Sherie Don, [place], DPT [phone]

## 2016-06-16 NOTE — Progress Notes (Signed)
Subjective: 3 Days Post-Op Procedure(s) (LRB): TOTAL HIP ARTHROPLASTY ANTERIOR APPROACH (Right) Patient reports pain as moderate. The patient did well with physical therapy this a.m. She denies  dizziness. Taking by mouth and voiding okay. She is ready for discharge home.  Objective: Vital signs in last 24 hours: Temp:  [98.4 F (36.9 C)-98.6 F (37 C)] 98.4 F (36.9 C) (10/28 0502) Pulse Rate:  [89-117] 117 (10/28 0502) Resp:  [16-18] 16 (10/27 1700) BP: (111-135)/(57-81) 117/57 (10/28 0502) SpO2:  [99 %-100 %] 100 % (10/28 0502)  Intake/Output from previous day: 10/27 0701 - 10/28 0700 In: 720 [P.O.:720] Out: 4 [Urine:4] Intake/Output this shift: No intake/output data recorded.   Recent Labs  06/14/16 0608 06/15/16 0718 06/16/16 0328  HGB 10.3* 9.1* 8.6*    Recent Labs  06/15/16 0718 06/16/16 0328  WBC 12.4* 11.6*  RBC 2.94* 2.78*  HCT 28.2* 26.6*  PLT 195 186   No results for input(s): NA, K, CL, CO2, BUN, CREATININE, GLUCOSE, CALCIUM in the last 72 hours. No results for input(s): LABPT, INR in the last 72 hours. Right hip exam: Aquacel dressing is clean and dry. Neurovascular intact Sensation intact distally Intact pulses distally Dorsiflexion/Plantar flexion intact Incision: dressing C/D/I Compartment soft  Assessment/Plan: 3 Days Post-Op Procedure(s) (LRB): TOTAL HIP ARTHROPLASTY ANTERIOR APPROACH (Right) Acute blood loss anemia, stable. Expected. Asymptomatic. Plan: Weight-bear as tolerated on right without hip precautions. Aspirin 325 mg twice daily for DVT prophylaxis. Treat 2 weeks. Discharge home with home health Follow-up with Dr. Mayer Camel in 2 weeks.  Akiva Josey G 06/16/2016, 9:44 AM

## 2016-06-16 NOTE — Discharge Summary (Signed)
Patient ID: Emma Wagner MRN: [id number] DOB/AGE: 45/09/1970 45 y.o.  Admit date: 06/13/2016 Discharge date: 06/16/2016  Admission Diagnoses:  Active Problems:   Arthritis of right hip   Postoperative anemia due to acute blood loss   Discharge Diagnoses:  Same  Past Medical History:  Diagnosis Date  . Anxiety    add also  . Arthritis   . Borderline type 2 diabetes mellitus since 2013   -lost weight and quit smoking and is no longer PRE diabetic  . Bursitis    bilateral hips, more on right hip  . Chronic kidney disease    h/o kidney stone 2016  . Constipation   . Depression   . Frequent urination    while kidney stone evident  . GERD (gastroesophageal reflux disease)   . Headache    "every blue moon. not often"  . Hemorrhoids   . History of kidney stones   . Pneumonia   . PONV (postoperative nausea and vomiting)    nausea with appendectomy and with hip replacement in March 2017    Surgeries: Procedure(s):Right TOTAL HIP ARTHROPLASTY ANTERIOR APPROACH on 06/13/2016   Discharged Condition: Improved  Hospital Course: NATAYSHA BUFFKIN is an 45 y.o. female who was admitted 06/13/2016 for operative treatment of primary osteoarthritis of the right hip.. Patient has severe unremitting pain that affects sleep, daily activities, and work/hobbies. After pre-op clearance the patient was taken to the operating room on 06/13/2016 and underwent  Procedure(s): Right TOTAL HIP ARTHROPLASTY ANTERIOR APPROACH.    Patient was given perioperative antibiotics:  Anti-infectives    Start     Dose/Rate Route Frequency Ordered Stop   06/13/16 0554  ceFAZolin (ANCEF) IVPB 2g/100 mL premix     2 g 200 mL/hr over 30 Minutes Intravenous On call to O.R. 06/13/16 0554 06/13/16 1140       Patient was given sequential compression devices, early ambulation, and chemoprophylaxis to prevent DVT.She had no dizziness or lightheadedness.  Patient benefited maximally from hospital stay and there  were no complications.    Recent vital signs:  Patient Vitals for the past 24 hrs:  BP Temp Temp src Pulse Resp SpO2  06/16/16 0502 (!) 117/57 98.4 F (36.9 C) Oral (!) 117 - 100 %  06/15/16 1958 135/81 98.6 F (37 C) Oral 98 - 99 %  06/15/16 1700 111/74 98.6 F (37 C) - 95 16 100 %  06/15/16 1152 133/75 - - 89 18 -     Recent laboratory studies:   Recent Labs  06/15/16 0718 06/16/16 0328  WBC 12.4* 11.6*  HGB 9.1* 8.6*  HCT 28.2* 26.6*  PLT 195 186     Discharge Medications:     Medication List    STOP taking these medications   ibuprofen 800 MG tablet Commonly known as:  ADVIL,MOTRIN     TAKE these medications   amphetamine-dextroamphetamine 20 MG tablet Commonly known as:  ADDERALL Take 20 mg by mouth 2 (two) times daily.   aspirin EC 325 MG tablet Take 1 tablet (325 mg total) by mouth 2 (two) times daily.   BENEFIBER PO Take 10 mLs by mouth as directed. 2 Teaspoonfuls into 4-8 ounces of liquid daily   buPROPion 300 MG 24 hr tablet Commonly known as:  WELLBUTRIN XL Take 300 mg by mouth daily with breakfast.   cefdinir 300 MG capsule Commonly known as:  OMNICEF Take 300 mg by mouth 2 (two) times daily. 10 day therapy course patient to complete 06/01/16.  celecoxib 200 MG capsule Commonly known as:  CELEBREX Take 1 capsule (200 mg total) by mouth every 12 (twelve) hours.   cyanocobalamin 1000 MCG tablet Take 1,000 mcg by mouth daily.   diphenhydramine-acetaminophen 25-500 MG Tabs tablet Commonly known as:  TYLENOL PM Take 1-2 tablets by mouth at bedtime.   FLUoxetine 40 MG capsule Commonly known as:  PROZAC Take 40 mg by mouth daily with breakfast.   gabapentin 300 MG capsule Commonly known as:  NEURONTIN Take 1 capsule (300 mg total) by mouth 3 (three) times daily.   guaiFENesin 600 MG 12 hr tablet Commonly known as:  MUCINEX Take 600 mg by mouth 2 (two) times daily.   MAGNESIUM CITRATE PO Take 150 mg by mouth 4 (four) times daily.    methocarbamol 500 MG tablet Commonly known as:  ROBAXIN Take 1 tablet (500 mg total) by mouth 2 (two) times daily with a meal.   multivitamin with minerals Tabs tablet Take 1 tablet by mouth daily.   omeprazole 20 MG capsule Commonly known as:  PRILOSEC Take 20 mg by mouth daily.   OVER THE COUNTER MEDICATION Take 5 capsules by mouth 3 (three) times daily. OTC. Intestinal Formula   oxyCODONE-acetaminophen 5-325 MG tablet Commonly known as:  ROXICET Take 1 tablet by mouth every 4 (four) hours as needed.       Diagnostic Studies: Dg C-arm 1-60 Min  Result Date: 06/13/2016 CLINICAL DATA:  Right hip arthroplasty. EXAM: DG C-ARM 61-120 MIN COMPARISON:  10/31/2015. FINDINGS: Total right hip replacement. Hardware intact. Anatomic alignment. 0 minutes 24 seconds fluoroscopy time. Two images obtained. IMPRESSION: Total right hip replacement with good anatomic alignment. Electronically Signed   By: Marcello Moores  Register   On: 06/13/2016 13:24   Dg Hip Operative Unilat W Or W/o Pelvis Right  Result Date: 06/13/2016 CLINICAL DATA:  Total right hip replacement. EXAM: OPERATIVE RIGHT HIP (WITH PELVIS IF PERFORMED) 2 VIEWS TECHNIQUE: Fluoroscopic spot image(s) were submitted for interpretation post-operatively. COMPARISON:  10/31/2015 . FINDINGS: Total right hip replacement. Hardware intact. Anatomic alignment. 0 minutes 24 seconds fluoroscopy time obtained . 2 images obtained. IMPRESSION: Total right hip replacement. Electronically Signed   By: Marcello Moores  Register   On: 06/13/2016 13:25    Disposition: 01-Home or Self Care  Discharge Instructions    Call MD / Call 911    Complete by:  As directed    If you experience chest pain or shortness of breath, CALL 911 and be transported to the hospital emergency room.  If you develope a fever above 101 F, pus (white drainage) or increased drainage or redness at the wound, or calf pain, call your surgeon's office.   Constipation Prevention    Complete by:   As directed    Drink plenty of fluids.  Prune juice may be helpful.  You may use a stool softener, such as Colace (over the counter) 100 mg twice a day.  Use MiraLax (over the counter) for constipation as needed.   Diet - low sodium heart healthy    Complete by:  As directed    Driving restrictions    Complete by:  As directed    No driving for 2 weeks   Follow the hip precautions as taught in Physical Therapy    Complete by:  As directed    Increase activity slowly as tolerated    Complete by:  As directed    Patient may shower    Complete by:  As directed  You may shower without a dressing once there is no drainage.  Do not wash over the wound.  If drainage remains, cover wound with plastic wrap and then shower.      Follow-up Information    Kerin Salen, MD Follow up in 2 week(s).   Specialty:  Orthopedic Surgery Contact information: [place] [address] [place] .   Why:  Someone from [facility] will contact you to arrange start date and time for therapy. Contact information: [address] [place] [address] [phone]            Signed: Erlene Senters 06/16/2016, 9:48 AM

## 2018-01-07 ENCOUNTER — Encounter: Payer: Self-pay | Admitting: General Practice

## 2018-04-03 LAB — HM MAMMOGRAPHY: HM Mammogram: NORMAL (ref 0–4)

## 2018-04-07 ENCOUNTER — Other Ambulatory Visit: Payer: Self-pay

## 2018-04-07 ENCOUNTER — Encounter: Payer: Self-pay | Admitting: Family Medicine

## 2018-04-07 ENCOUNTER — Ambulatory Visit (INDEPENDENT_AMBULATORY_CARE_PROVIDER_SITE_OTHER): Payer: 59 | Admitting: Family Medicine

## 2018-04-07 ENCOUNTER — Ambulatory Visit (INDEPENDENT_AMBULATORY_CARE_PROVIDER_SITE_OTHER): Payer: 59

## 2018-04-07 ENCOUNTER — Other Ambulatory Visit: Payer: Self-pay | Admitting: General Practice

## 2018-04-07 VITALS — BP 122/86 | HR 92 | Temp 98.8°F | Resp 17 | Ht 66.0 in | Wt 222.2 lb

## 2018-04-07 DIAGNOSIS — R062 Wheezing: Secondary | ICD-10-CM

## 2018-04-07 DIAGNOSIS — R05 Cough: Secondary | ICD-10-CM

## 2018-04-07 DIAGNOSIS — R059 Cough, unspecified: Secondary | ICD-10-CM

## 2018-04-07 DIAGNOSIS — E669 Obesity, unspecified: Secondary | ICD-10-CM | POA: Diagnosis not present

## 2018-04-07 LAB — CBC WITH DIFFERENTIAL/PLATELET
Basophils Absolute: 0 10*3/uL (ref 0.0–0.1)
Basophils Relative: 0.4 % (ref 0.0–3.0)
Eosinophils Absolute: 0.1 10*3/uL (ref 0.0–0.7)
Eosinophils Relative: 1 % (ref 0.0–5.0)
HEMATOCRIT: 33.2 % — AB (ref 36.0–46.0)
HEMOGLOBIN: 10.4 g/dL — AB (ref 12.0–15.0)
LYMPHS PCT: 37.2 % (ref 12.0–46.0)
Lymphs Abs: 3.6 10*3/uL (ref 0.7–4.0)
MCHC: 31.3 g/dL (ref 30.0–36.0)
MCV: 75.7 fl — AB (ref 78.0–100.0)
MONOS PCT: 6.1 % (ref 3.0–12.0)
Monocytes Absolute: 0.6 10*3/uL (ref 0.1–1.0)
NEUTROS ABS: 5.3 10*3/uL (ref 1.4–7.7)
Neutrophils Relative %: 55.3 % (ref 43.0–77.0)
PLATELETS: 405 10*3/uL — AB (ref 150.0–400.0)
RBC: 4.39 Mil/uL (ref 3.87–5.11)
RDW: 16.3 % — AB (ref 11.5–15.5)
WBC: 9.6 10*3/uL (ref 4.0–10.5)

## 2018-04-07 LAB — LIPID PANEL
CHOL/HDL RATIO: 4
Cholesterol: 193 mg/dL (ref 0–200)
HDL: 45 mg/dL (ref >39.00)
LDL Cholesterol: 113 mg/dL — ABNORMAL HIGH (ref 0–99)
NONHDL: 147.85
Triglycerides: 173 mg/dL — ABNORMAL HIGH (ref 0.0–149.0)
VLDL: 34.6 mg/dL (ref 0.0–40.0)

## 2018-04-07 LAB — HEPATIC FUNCTION PANEL
ALT: 11 U/L (ref 0–35)
AST: 12 U/L (ref 0–37)
Albumin: 4.1 g/dL (ref 3.5–5.2)
Alkaline Phosphatase: 53 U/L (ref 39–117)
BILIRUBIN DIRECT: 0 mg/dL (ref 0.0–0.3)
Total Bilirubin: 0.2 mg/dL (ref 0.2–1.2)
Total Protein: 6.9 g/dL (ref 6.0–8.3)

## 2018-04-07 LAB — BASIC METABOLIC PANEL
BUN: 13 mg/dL (ref 6–23)
CALCIUM: 9.2 mg/dL (ref 8.4–10.5)
CO2: 26 mEq/L (ref 19–32)
Chloride: 103 mEq/L (ref 96–112)
Creatinine, Ser: 0.89 mg/dL (ref 0.40–1.20)
GFR: 72.16 mL/min (ref >60.00)
Glucose, Bld: 92 mg/dL (ref 70–99)
Potassium: 3.8 mEq/L (ref 3.5–5.1)
SODIUM: 136 meq/L (ref 135–145)

## 2018-04-07 LAB — HEMOGLOBIN A1C: Hgb A1c MFr Bld: 5.9 % (ref 4.6–6.5)

## 2018-04-07 LAB — TSH: TSH: 1.85 u[IU]/mL (ref 0.35–4.50)

## 2018-04-07 MED ORDER — ALBUTEROL SULFATE HFA 108 (90 BASE) MCG/ACT IN AERS: 2.0000 | INHALATION_SPRAY | Freq: Four times a day (QID) | RESPIRATORY_TRACT | 2 refills | Status: DC | PRN

## 2018-04-07 MED ORDER — PREDNISONE 10 MG PO TABS: ORAL_TABLET | ORAL | 0 refills | Status: DC

## 2018-04-07 MED ORDER — ALBUTEROL SULFATE (2.5 MG/3ML) 0.083% IN NEBU
2.5000 mg | INHALATION_SOLUTION | Freq: Once | RESPIRATORY_TRACT | Status: AC
  Administered 2018-04-07: 2.5 mg via RESPIRATORY_TRACT

## 2018-04-07 NOTE — Patient Instructions (Signed)
Go to [facility] to get your chest xray ([facility], [address], office is on L hand side at [employer] red [address] across from [employer]) [facility] notify you of your lab results and make any changes if needed Continue to drink plenty of fluids Use the Albuterol inhaler- 2 puffs every 4 hrs as needed for cough/wheezing Try and work on healthy diet and regular exercise- you can do it! Call with any questions or concerns Hang in there!

## 2018-04-07 NOTE — Assessment & Plan Note (Signed)
Deteriorated.  Pt has gained 27 lbs since last visit.  Stressed need for healthy diet and regular exercise.  Check labs to risk stratify.  Will follow.

## 2018-04-07 NOTE — Progress Notes (Signed)
   Subjective:    Patient ID: Emma Wagner, female    DOB: 1970-08-22, 47 y.o.   MRN: [id number]  HPI Cough- 'really bad aggravating cough'.  Pt is worried b/c she was a former smoker.  Is 'driving family crazy'.  sxs started 2-3 months ago.  Initially thought it was allergies.  No fevers.  Pt denies feeling poorly.  Cough is not keeping her up at night.  'I feel like i'm trying to cough something up that won't come up'- cough is most often dry.  Taking Omeprazole daily w/ good control of GERD.  No hx of asthma.  Obesity- pt has gained 29 lbs since last visit in 2017.  Not exercising regularly, not eating well.  Is fearful of diabetes.  Review of Systems For ROS see HPI     Objective:   Physical Exam  Constitutional: She is oriented to person, place, and time. She appears well-developed and well-nourished. No distress.  HENT:  Head: Normocephalic and atraumatic.  TMs normal bilaterally Mild nasal congestion Throat w/out erythema, edema, or exudate  Eyes: Pupils are equal, round, and reactive to light. Conjunctivae and EOM are normal.  Neck: Normal range of motion. Neck supple.  Cardiovascular: Normal rate, regular rhythm, normal heart sounds and intact distal pulses.  No murmur heard. Pulmonary/Chest: Effort normal. No respiratory distress. She has wheezes (wheezing over R upper and middle lobes- cleared s/p neb tx). She has no rales.  Near continuous dry cough  Lymphadenopathy:    She has no cervical adenopathy.  Neurological: She is alert and oriented to person, place, and time.  Skin: Skin is warm and dry.  Vitals reviewed.         Assessment & Plan:

## 2018-04-07 NOTE — Assessment & Plan Note (Signed)
New.  Pt reports cough x2-3 months.  Otherwise feeling well.  Cough briefly stopped after neb tx in office but then returned.  Will get CXR to assess.  If infxn present, will tx w/ abx.  If no abnormalities on CXR will start Prednisone taper.  Albuterol PRN.  Pt expressed understanding and is in agreement w/ plan.

## 2018-04-08 ENCOUNTER — Encounter: Payer: Self-pay | Admitting: General Practice

## 2018-04-08 ENCOUNTER — Encounter: Payer: Self-pay | Admitting: Family Medicine

## 2018-04-09 ENCOUNTER — Encounter: Payer: Self-pay | Admitting: Family Medicine

## 2018-04-09 MED ORDER — OMEPRAZOLE 40 MG PO CPDR: 40.0000 mg | DELAYED_RELEASE_CAPSULE | Freq: Every day | ORAL | 3 refills | Status: DC

## 2018-04-11 ENCOUNTER — Emergency Department (HOSPITAL_COMMUNITY): Payer: 59

## 2018-04-11 ENCOUNTER — Encounter: Payer: Self-pay | Admitting: Family Medicine

## 2018-04-11 ENCOUNTER — Emergency Department (HOSPITAL_COMMUNITY)
Admission: EM | Admit: 2018-04-11 | Discharge: 2018-04-11 | Disposition: A | Discharge status: Home / Self Care | Payer: 59 | Attending: Emergency Medicine | Admitting: Emergency Medicine

## 2018-04-11 ENCOUNTER — Encounter (HOSPITAL_COMMUNITY): Payer: Self-pay

## 2018-04-11 DIAGNOSIS — Z79899 Other long term (current) drug therapy: Secondary | ICD-10-CM | POA: Insufficient documentation

## 2018-04-11 DIAGNOSIS — N189 Chronic kidney disease, unspecified: Secondary | ICD-10-CM | POA: Diagnosis not present

## 2018-04-11 DIAGNOSIS — R0602 Shortness of breath: Secondary | ICD-10-CM | POA: Diagnosis not present

## 2018-04-11 DIAGNOSIS — R059 Cough, unspecified: Secondary | ICD-10-CM

## 2018-04-11 DIAGNOSIS — Z87891 Personal history of nicotine dependence: Secondary | ICD-10-CM | POA: Diagnosis not present

## 2018-04-11 DIAGNOSIS — R05 Cough: Secondary | ICD-10-CM | POA: Diagnosis not present

## 2018-04-11 DIAGNOSIS — Z96643 Presence of artificial hip joint, bilateral: Secondary | ICD-10-CM | POA: Diagnosis not present

## 2018-04-11 DIAGNOSIS — R079 Chest pain, unspecified: Secondary | ICD-10-CM | POA: Diagnosis present

## 2018-04-11 LAB — CBC WITH DIFFERENTIAL/PLATELET
Abs Immature Granulocytes: 0 10*3/uL (ref 0.0–0.1)
Basophils Absolute: 0.1 10*3/uL (ref 0.0–0.1)
Basophils Relative: 1 %
EOS ABS: 0 10*3/uL (ref 0.0–0.7)
EOS PCT: 0 %
HEMATOCRIT: 35.2 % — AB (ref 36.0–46.0)
Hemoglobin: 10.4 g/dL — ABNORMAL LOW (ref 12.0–15.0)
IMMATURE GRANULOCYTES: 0 %
LYMPHS ABS: 2.2 10*3/uL (ref 0.7–4.0)
Lymphocytes Relative: 18 %
MCH: 23.6 pg — AB (ref 26.0–34.0)
MCHC: 29.5 g/dL — AB (ref 30.0–36.0)
MCV: 80 fL (ref 78.0–100.0)
MONOS PCT: 2 %
Monocytes Absolute: 0.2 10*3/uL (ref 0.1–1.0)
NEUTROS PCT: 79 %
Neutro Abs: 9.5 10*3/uL — ABNORMAL HIGH (ref 1.7–7.7)
Platelets: 394 10*3/uL (ref 150–400)
RBC: 4.4 MIL/uL (ref 3.87–5.11)
RDW: 15.2 % (ref 11.5–15.5)
WBC: 12 10*3/uL — ABNORMAL HIGH (ref 4.0–10.5)

## 2018-04-11 LAB — BASIC METABOLIC PANEL
Anion gap: 14 (ref 5–15)
BUN: 9 mg/dL (ref 6–20)
CALCIUM: 9.5 mg/dL (ref 8.9–10.3)
CO2: 23 mmol/L (ref 22–32)
CREATININE: 1 mg/dL (ref 0.44–1.00)
Chloride: 103 mmol/L (ref 98–111)
GFR calc Af Amer: >60 mL/min (ref >60)
GLUCOSE: 136 mg/dL — AB (ref 70–99)
Potassium: 3.6 mmol/L (ref 3.5–5.1)
Sodium: 140 mmol/L (ref 135–145)

## 2018-04-11 LAB — POC OCCULT BLOOD, ED: Fecal Occult Bld: NEGATIVE

## 2018-04-11 LAB — D-DIMER, QUANTITATIVE (NOT AT ARMC): D DIMER QUANT: 0.6 ug{FEU}/mL — AB (ref 0.00–0.50)

## 2018-04-11 LAB — I-STAT TROPONIN, ED: Troponin i, poc: 0 ng/mL (ref 0.00–0.08)

## 2018-04-11 MED ORDER — IOPAMIDOL (ISOVUE-370) INJECTION 76%
INTRAVENOUS | Status: AC
  Filled 2018-04-11: qty 100

## 2018-04-11 MED ORDER — IBUPROFEN 800 MG PO TABS
800.0000 mg | ORAL_TABLET | Freq: Once | ORAL | Status: AC
  Administered 2018-04-11: 800 mg via ORAL
  Filled 2018-04-11: qty 1

## 2018-04-11 MED ORDER — SUCRALFATE 1 G PO TABS: 1.0000 g | ORAL_TABLET | Freq: Three times a day (TID) | ORAL | 0 refills | Status: DC

## 2018-04-11 MED ORDER — BENZONATATE 100 MG PO CAPS: 100.0000 mg | ORAL_CAPSULE | Freq: Three times a day (TID) | ORAL | 0 refills | Status: AC

## 2018-04-11 MED ORDER — BENZONATATE 100 MG PO CAPS
100.0000 mg | ORAL_CAPSULE | Freq: Three times a day (TID) | ORAL | Status: DC
  Administered 2018-04-11: 100 mg via ORAL
  Filled 2018-04-11: qty 1

## 2018-04-11 MED ORDER — IOPAMIDOL (ISOVUE-370) INJECTION 76%
100.0000 mL | Freq: Once | INTRAVENOUS | Status: AC | PRN
  Administered 2018-04-11: 100 mL via INTRAVENOUS

## 2018-04-11 NOTE — ED Notes (Signed)
Pt requested oxygen for comfort.  Applied 2L through nasal cannula

## 2018-04-11 NOTE — ED Triage Notes (Signed)
Patient complains of cough x 3 months and the past week has had severe reflux and the past 2 days CP that is making her feel SOB. Seen by [facility] on Wednesday and had labs and CXR.states using inhaler and steroids with no relief low grade fever today

## 2018-04-11 NOTE — ED Provider Notes (Addendum)
[facility] EMERGENCY DEPARTMENT Provider Note  CSN: [id number] Arrival date & time: 04/11/18  1348  History   Chief Complaint Chief Complaint  Patient presents with  . Chest Pain/ cough    HPI Emma Wagner is a 47 y.o. female with a medical history of GERD who presented to the ED for cough, chest pain and SOB. Patient reports nonproductive cough x3 months, but reports chest pain and SOB began 3 days ago. Denies fever, fatigue, leg swelling, palpitations, abdominal pain, N/V or diaphoresis. She reports traveling to [place] in June. Denies exogenous hormone use and current tobacco use (quit 6 years ago). Patient states she was seen by her PCP on 04/07/18 for cough and had chest x-ray which did not show any acute abnormalities. Patient denies recent sick contacts or accompanying upper respiratory symptoms. Patient has tried steroids for cough prior to coming to the ED.   Past Medical History:  Diagnosis Date  . Anxiety    add also  . Arthritis   . Borderline type 2 diabetes mellitus since 2013   -lost weight and quit smoking and is no longer PRE diabetic  . Bursitis    bilateral hips, more on right hip  . Chronic kidney disease    h/o kidney stone 2016  . Constipation   . Depression   . Frequent urination    while kidney stone evident  . GERD (gastroesophageal reflux disease)   . Headache    "every blue moon. not often"  . Hemorrhoids   . History of kidney stones   . Pneumonia   . PONV (postoperative nausea and vomiting)    nausea with appendectomy and with hip replacement in March 2017    Patient Active Problem List   Diagnosis Date Noted  . Postoperative anemia due to acute blood loss 06/16/2016  . Primary osteoarthritis of left hip 10/31/2015  . Primary osteoarthritis of right hip 10/30/2015  . Pain of right upper arm 05/12/2015  . Hair loss 05/02/2012  . Prediabetes 02/20/2012  . Thrombosed external hemorrhoid 02/08/2012  . Perirectal  abscess 02/07/2012  . Post-nasal drip 11/27/2011  . Anxiety and depression 11/27/2011  . Arthritis of right hip 11/27/2011  . General medical examination 10/29/2011  . Cough 09/17/2011  . Obesity (BMI 30-39.9) 09/17/2011  . PNA (pneumonia) 08/03/2011  . Tobacco abuse 08/03/2011  . X-ray of lung, abnormal 06/11/2007  . GERD 06/11/2007    Past Surgical History:  Procedure Laterality Date  . APPENDECTOMY  1985  . CESAREAN SECTION  2009  . TOTAL HIP ARTHROPLASTY Left 10/31/2015   Procedure: TOTAL HIP ARTHROPLASTY ANTERIOR APPROACH;  Surgeon: Frederik Pear, MD;  Location: [facility];  Service: Orthopedics;  Laterality: Left;  . TOTAL HIP ARTHROPLASTY Right 06/13/2016   Procedure: TOTAL HIP ARTHROPLASTY ANTERIOR APPROACH;  Surgeon: Frederik Pear, MD;  Location: [facility];  Service: Orthopedics;  Laterality: Right;  . TUBAL LIGATION  2009  . wisdom teth       OB History   None      Home Medications    Prior to Admission medications   Medication Sig Start Date End Date Taking? Authorizing Provider  albuterol (PROVENTIL HFA;VENTOLIN HFA) 108 (90 Base) MCG/ACT inhaler Inhale 2 puffs into the lungs every 6 (six) hours as needed for wheezing or shortness of breath. 04/07/18   Midge Minium, MD  amphetamine-dextroamphetamine (ADDERALL) 20 MG tablet Take 20 mg by mouth 2 (two) times daily.     [provider]  benzonatate (TESSALON) 100 MG capsule Take 1 capsule (100 mg total) by mouth 3 (three) times daily for 10 days. 04/11/18 04/21/18  Riku Buttery, Alvie [place]idelberg I, PA-C  buPROPion (WELLBUTRIN XL) 150 MG 24 hr tablet Take 150 mg by mouth every morning. 03/21/18   [provider]  buPROPion (WELLBUTRIN XL) 300 MG 24 hr tablet Take 300 mg by mouth daily with breakfast. 05/22/16   [provider]  cyanocobalamin 1000 MCG tablet Take 1,000 mcg by mouth daily.    [provider]  diphenhydramine-acetaminophen (TYLENOL PM) 25-500 MG TABS tablet Take 1-2 tablets by mouth at bedtime.     [provider]  FLUoxetine (PROZAC) 40 MG capsule Take 40 mg by mouth daily with breakfast. 05/22/16   [provider]  gabapentin (NEURONTIN) 300 MG capsule Take 1 capsule (300 mg total) by mouth 3 (three) times daily. Patient not taking: Reported on 04/07/2018 06/15/16   Leighton Parody, PA-C  guaiFENesin (MUCINEX) 600 MG 12 hr tablet Take 600 mg by mouth 2 (two) times daily.    [provider]  ibuprofen (ADVIL,MOTRIN) 800 MG tablet ibuprofen 800 mg tablet  TAKE 1 TABLET 3 TIMES A DAY AS NEEDED FOR PAIN    [provider]  MAGNESIUM CITRATE PO Take 150 mg by mouth 4 (four) times daily.     [provider]  Multiple Vitamin (MULTIVITAMIN WITH MINERALS) TABS tablet Take 1 tablet by mouth daily.    [provider]  omeprazole (PRILOSEC) 40 MG capsule Take 1 capsule (40 mg total) by mouth daily. 04/09/18   Midge Minium, MD  ondansetron (ZOFRAN-ODT) 4 MG disintegrating tablet ondansetron 4 mg disintegrating tablet  TAKE 1 TABLET BY MOUTH EVERY 8 HOURS AS NEEDED FOR NAUSEA AND VOMITING    [provider]  OVER THE COUNTER MEDICATION Take 5 capsules by mouth 3 (three) times daily. OTC. Intestinal Formula    [provider]  predniSONE (DELTASONE) 10 MG tablet Take 3 tablets by mouth x3 days, then 2 tablets by mouth x3 days, then 1 tablet by mouth x3 days 04/07/18   Midge Minium, MD  sucralfate (CARAFATE) 1 g tablet Take 1 tablet (1 g total) by mouth 4 (four) times daily -  with meals and at bedtime. 04/11/18   Midge Minium, MD  Wheat Dextrin (BENEFIBER PO) Take 10 mLs by mouth as directed. 2 Teaspoonfuls into 4-8 ounces of liquid daily    [provider]    Family History Family History  Problem Relation Age of Onset  . Heart failure Other   . Hypertension Other   . Cancer Other   . Anxiety disorder Other   . Depression Other   . Arthritis Mother   . Hypertension Mother   . Prostate cancer  Father   . Skin cancer Father   . Congestive Heart Failure Father   . Hypertension Father     Social History Social History   Tobacco Use  . Smoking status: Former Smoker    Packs/day: 1.00    Years: 26.00    Pack years: 26.00    Last attempt to quit: 07/20/2012    Years since quitting: 5.7  . Smokeless tobacco: Never Used  Substance Use Topics  . Alcohol use: Yes    Comment: occasional  . Drug use: No     Allergies   Patient has no known allergies.   Review of Systems Review of Systems  Constitutional: Negative for chills, fatigue and fever.  HENT: Negative.   Eyes: Negative.   Respiratory: Positive for cough and shortness of breath. Negative for chest tightness.   Cardiovascular: Positive for chest pain. Negative for palpitations and leg swelling.  Gastrointestinal: Negative.   Genitourinary: Negative.   Musculoskeletal: Negative.   Skin: Negative.   Neurological: Negative.   Hematological: Negative.    Physical Exam Updated Vital Signs BP (!) 144/101 (BP Location: Left Arm)   Pulse 81   Temp 98.9 F (37.2 C) (Oral)   Resp 13   LMP 04/06/2018   SpO2 100%   Physical Exam  Constitutional: She appears well-developed and well-nourished.  HENT:  Mouth/Throat: Uvula is midline, oropharynx is clear and moist and mucous membranes are normal. No posterior oropharyngeal erythema.  Eyes: Pupils are equal, round, and reactive to light. Conjunctivae and EOM are normal.  Cardiovascular: Normal rate, regular rhythm, normal heart sounds and intact distal pulses.  Pulmonary/Chest: Effort normal and breath sounds normal. No tachypnea. No respiratory distress. She has no wheezes. She has no rales.  Coughs frequently throughout the exam. Able to phonate normally and speak in complete sentences without issue.  Abdominal: Soft. Bowel sounds are normal. There is no tenderness.  Musculoskeletal: Normal range of motion. She exhibits no edema.  Neurological: She is alert.  Skin:  Skin is warm. Capillary refill takes less than 2 seconds.  Nursing note and vitals reviewed.  ED Treatments / Results  Labs (all labs ordered are listed, but only abnormal results are displayed) Labs Reviewed  CBC WITH DIFFERENTIAL/PLATELET - Abnormal; Notable for the following components:      Result Value   WBC 12.0 (*)    Hemoglobin 10.4 (*)    HCT 35.2 (*)    MCH 23.6 (*)    MCHC 29.5 (*)    Neutro Abs 9.5 (*)    All other components within normal limits  BASIC METABOLIC PANEL - Abnormal; Notable for the following components:   Glucose, Bld 136 (*)    All other components within normal limits  D-DIMER, QUANTITATIVE (NOT AT [facility]) - Abnormal; Notable for the following components:   D-Dimer, Quant 0.60 (*)    All other components within normal limits  I-STAT TROPONIN, ED  POC OCCULT BLOOD, ED    EKG EKG Interpretation  Date/Time:  Friday April 11 2018 13:50:57 EDT Ventricular Rate:  116 PR Interval:  132 QRS Duration: 78 QT Interval:  320 QTC Calculation: 444 R Axis:   57 Text Interpretation:  Sinus tachycardia Otherwise normal ECG Confirmed by Fredia Sorrow [phone]) on 04/11/2018 7:11:54 PM   Radiology Dg Chest 2 View  Result Date: 04/11/2018 CLINICAL DATA:  Rhonchi EXAM: CHEST - 2 VIEW COMPARISON:  April 07, 2018 FINDINGS: There is no appreciable edema or consolidation. The heart size and pulmonary vascularity are normal. No adenopathy. No pneumothorax. No evident bone lesions. IMPRESSION: No edema or consolidation. Electronically Signed   By: Lowella Grip III M.D.   On: 04/11/2018 15:18   Ct Angio Chest Pe W And/or Wo Contrast  Result Date: 04/11/2018 CLINICAL DATA:  Elevated D-dimer, chest pain, dyspnea EXAM: CT ANGIOGRAPHY CHEST WITH CONTRAST TECHNIQUE: Multidetector CT imaging of the chest was performed using the standard protocol during bolus administration of intravenous contrast. Multiplanar CT image reconstructions and MIPs were obtained to evaluate  the vascular anatomy. CONTRAST:  138mL ISOVUE-370 IOPAMIDOL (ISOVUE-370) INJECTION 76% COMPARISON:  Chest radiograph from earlier today. FINDINGS: Cardiovascular: The study is high quality for the evaluation of pulmonary embolism. There are no  filling defects in the central, lobar, segmental or subsegmental pulmonary artery branches to suggest acute pulmonary embolism. Great vessels are normal in course and caliber. Normal heart size. No significant pericardial fluid/thickening. Mediastinum/Nodes: No discrete thyroid nodules. Unremarkable esophagus. No pathologically enlarged axillary, mediastinal or hilar lymph nodes. Lungs/Pleura: No pneumothorax. No pleural effusion. No acute consolidative airspace disease, lung masses or significant pulmonary nodules. Upper abdomen: Left adrenal adenoma measuring 1.9 cm with density -29 HU. Musculoskeletal: No aggressive appearing focal osseous lesions. Moderate thoracic spondylosis. Review of the MIP images confirms the above findings. IMPRESSION: 1. No pulmonary embolism.  No active pulmonary disease. 2. Left adrenal adenoma. Electronically Signed   By: Ilona Sorrel M.D.   On: 04/11/2018 18:14    Procedures Procedures (including critical care time)  Medications Ordered in ED Medications  benzonatate (TESSALON) capsule 100 mg (100 mg Oral Given 04/11/18 2012)  iopamidol (ISOVUE-370) 76 % injection 100 mL (100 mLs Intravenous Contrast Given 04/11/18 1744)  ibuprofen (ADVIL,MOTRIN) tablet 800 mg (800 mg Oral Given 04/11/18 2012)     Initial Impression / Assessment and Plan / ED Course  Triage vital signs and the nursing notes have been reviewed.  Pertinent labs & imaging results that were available during care of the patient were reviewed and considered in medical decision making (see chart for details).  Patient presents with chronic cough and new onset chest pain and SOB. Initially, tachycardic in triage. On exam, patient is not in respiratory distress and has  normal oxygen saturations, but she coughs frequently throughout the exam. She currently endorses SOB. She reports international travel around the same time that the cough started. Current clinical presentation and history is very concerning for PE.   Clinical Course as of Apr 11 2033  Fri Apr 11, 2018  1541 EKG showed NSR with tachycardia. No ST elevations/depressions or acute ischemia/infarct.    [GM]  1650 Elevated d-dimer at 0.60. With pulmonary complaints, CT angio chest ordered to evaluate for PE.   [GM]  1820 CT angio showed no pulmonary embolism. There are no pulmonary abnormalities visualized that could explain chest pain or SOB, including consolidation, masses or nodules.   [GM]  4166 Patient anemic at 10.4. Hgb has been lower than this over the last year, but per medical record review this occurred following an orthopedic surgery where she required blood transfusions. Last normal ranges hemoglobin seen in 2017. Hemoccult ordered to evaluate for GI    [GM]  1922 Hemoccult negative. Acute and emergent causes of SOB have been evaluated and ruled out. Reviewed current medications with patient. No medications that she is currently taking or their interactions can explain current symptoms. Results and evaluation conducted today have been reviewed thoroughly with the patient. Will ambulate patient and measure oxygen saturations as she continues to endorse SOB.   [GM]  2024 RN reports that oxygen saturations remained > 98% with ambulation and that patient was able to walk with ease.   [GM]    Clinical Course User Index [GM] Ryn Peine, Jonelle Sports, PA-C   Final Clinical Impressions(s) / ED Diagnoses  1. Cough. Rx Tessalon 100mg  TID prescribed. Advised to follow-up with PCP for further management and evaluation. Advised to continue taking medications prescribed by PCP. 2. Shortness of Breath. Emergent causes ruled out today. Advised to follow-up with PCP. Education provided on return  precautions.   Dispo: Home. After thorough clinical evaluation, this patient is determined to be medically stable and can be safely discharged with the previously mentioned treatment  and/or outpatient follow-up/referral(s). At this time, there are no other apparent medical conditions that require further screening, evaluation or treatment.  Final diagnoses:  Cough  Shortness of breath    ED Discharge Orders         Ordered    benzonatate (TESSALON) 100 MG capsule  3 times daily     04/11/18 2034            Junita Push 04/11/18 2033    Larayne Baxley, [place] I, PA-C 04/11/18 2034    Fredia Sorrow, MD 04/20/18 [phone]

## 2018-04-11 NOTE — Discharge Instructions (Signed)
Your work-up all showed up negative today. There was no blood clot seen on the CT scan. EKG and troponin (cardiac marker) were also normal which rules out heart attack. Your chest x-ray was also normal and there were not any signs of pneumonia, masses/nodules or other abnormalities that could explain your symptoms.  It is best for you to follow-up with your PCP for a more in-depth evaluation of symptoms. Continue taking the medications that she has prescribed for you. I have also added Tessalon which should help with the cough.  Thank you for allowing me to take care of you today!

## 2018-04-11 NOTE — ED Provider Notes (Signed)
Patient placed in Quick Look pathway, seen and evaluated   Chief Complaint: Cough, chest pain  HPI:   Patient has had gradually worsening cough and chest pain for about 3 months.  This gets better when she lays down.  Was seen by PCP earlier who got labs and CXR.   ROS: Cough, no congestion.   Physical Exam:   Gen: No distress  Neuro: Awake and Alert  Skin: Warm    Focused Exam: Rhonchi heard posteriorly in right middle field. Coughing during exam. Tachycardic.    Initiation of care has begun. The patient has been counseled on the process, plan, and necessity for staying for the completion/evaluation, and the remainder of the medical screening examination    Ollen Gross 04/11/18 1404    Maudie Flakes, MD 04/11/18 [phone]

## 2018-04-11 NOTE — ED Notes (Signed)
Patient ambulated to the bathroom while checking pulse ox. Patient's SPO2 stayed between 98-100% on room air. Patient walked with a steady gait, but had to stop on the way back to the bathroom to keep herself from coughing. RN is aware.

## 2018-04-11 NOTE — ED Notes (Signed)
Lab contacted regarding D-dimer and states will start running test.

## 2018-04-29 ENCOUNTER — Other Ambulatory Visit: Payer: Self-pay | Admitting: Family Medicine

## 2018-05-26 ENCOUNTER — Other Ambulatory Visit: Payer: Self-pay | Admitting: Family Medicine

## 2018-06-18 ENCOUNTER — Encounter: Payer: Self-pay | Admitting: Family Medicine

## 2018-06-19 ENCOUNTER — Other Ambulatory Visit (HOSPITAL_COMMUNITY): Payer: Self-pay | Admitting: Orthopedic Surgery

## 2018-06-19 ENCOUNTER — Ambulatory Visit (HOSPITAL_COMMUNITY)
Admission: RE | Admit: 2018-06-19 | Discharge: 2018-06-19 | Disposition: A | Discharge status: Home / Self Care | Payer: 59 | Source: Ambulatory Visit | Attending: Orthopedic Surgery | Admitting: Orthopedic Surgery

## 2018-06-19 DIAGNOSIS — M50322 Other cervical disc degeneration at C5-C6 level: Secondary | ICD-10-CM | POA: Diagnosis not present

## 2018-06-19 DIAGNOSIS — M545 Low back pain, unspecified: Secondary | ICD-10-CM

## 2018-06-19 DIAGNOSIS — M4802 Spinal stenosis, cervical region: Secondary | ICD-10-CM | POA: Diagnosis not present

## 2018-06-19 DIAGNOSIS — M48061 Spinal stenosis, lumbar region without neurogenic claudication: Secondary | ICD-10-CM | POA: Insufficient documentation

## 2018-06-19 DIAGNOSIS — M5126 Other intervertebral disc displacement, lumbar region: Secondary | ICD-10-CM | POA: Insufficient documentation

## 2018-06-19 DIAGNOSIS — M542 Cervicalgia: Secondary | ICD-10-CM

## 2018-06-19 MED ORDER — SUMATRIPTAN SUCCINATE 50 MG PO TABS: 50.0000 mg | ORAL_TABLET | Freq: Once | ORAL | 6 refills | Status: DC

## 2018-08-26 ENCOUNTER — Encounter: Payer: Self-pay | Admitting: Family Medicine

## 2018-08-27 MED ORDER — SUMATRIPTAN SUCCINATE 50 MG PO TABS: 50.0000 mg | ORAL_TABLET | Freq: Once | ORAL | 3 refills | Status: DC

## 2018-11-17 ENCOUNTER — Encounter: Payer: Self-pay | Admitting: Family Medicine

## 2018-11-21 ENCOUNTER — Encounter: Payer: Self-pay | Admitting: Family Medicine

## 2018-11-21 ENCOUNTER — Ambulatory Visit (INDEPENDENT_AMBULATORY_CARE_PROVIDER_SITE_OTHER): Payer: Self-pay | Admitting: Family Medicine

## 2018-11-21 VITALS — HR 104 | Temp 98.7°F | Ht 66.0 in | Wt 230.0 lb

## 2018-11-21 DIAGNOSIS — G43909 Migraine, unspecified, not intractable, without status migrainosus: Secondary | ICD-10-CM

## 2018-11-21 DIAGNOSIS — K219 Gastro-esophageal reflux disease without esophagitis: Secondary | ICD-10-CM

## 2018-11-21 DIAGNOSIS — E669 Obesity, unspecified: Secondary | ICD-10-CM

## 2018-11-21 MED ORDER — SUCRALFATE 1 G PO TABS: 1.0000 g | ORAL_TABLET | Freq: Three times a day (TID) | ORAL | 0 refills | Status: DC

## 2018-11-21 MED ORDER — PHENTERMINE HCL 37.5 MG PO CAPS: 37.5000 mg | ORAL_CAPSULE | ORAL | 2 refills | Status: DC

## 2018-11-21 MED ORDER — PANTOPRAZOLE SODIUM 40 MG PO TBEC: 40.0000 mg | DELAYED_RELEASE_TABLET | Freq: Two times a day (BID) | ORAL | 3 refills | Status: DC

## 2018-11-21 MED ORDER — RIZATRIPTAN BENZOATE 10 MG PO TABS: 10.0000 mg | ORAL_TABLET | ORAL | 6 refills | Status: DC | PRN

## 2018-11-21 NOTE — Progress Notes (Signed)
Virtual Visit via Video   I connected with Emma Wagner on 11/21/18 at 10:30 AM EDT by a video enabled telemedicine application and verified that I am speaking with the correct person using two identifiers. Location patient: Home Location provider: [facility], Office Persons participating in the virtual visit: pt and myself  I discussed the limitations of evaluation and management by telemedicine and the availability of in person appointments. The patient expressed understanding and agreed to proceed.  Subjective:   HPI:  Weight gain- 'I need help'.  Pt has gained 8 lbs since last visit.  No exercise- 'b/c of migraines and heartburn'.  Not on any particular diet.  Lost job in the fall and 'everything just fell off'.  Pt was previously on Phentermine w/ good results- 'it was phenomenal'.  GERD- 'it's killing me'.  Sxs initially discussed in September.  Pt reports sxs have worsened over the last month despite Carafate and Omeprazole 40mg  BID.  Has added Gaviscon w/o relief.  Pepto w/o relief.  Sxs are severe enough to cause vomiting.  sxs are worse after waking.  Typically eats dinner at 7 and wakes up between 9-9:30  Migraines- 'they were happening every day and sending me to bed every day'.  Typically occurring in the AM.  Pt wonders if there is an association w/ lying down.  Pt had daith piercing bilaterally and this has improved frequency and severity but continues to have headaches 2-3x/week.  Imitrex causes strange feelings and takes hours to work.  Sxs used to resolve w/ Excedrin migraine but now no relief.  ROS: See pertinent positives and negatives per HPI.  Patient Active Problem List   Diagnosis Date Noted  . Postoperative anemia due to acute blood loss 06/16/2016  . Primary osteoarthritis of left hip 10/31/2015  . Primary osteoarthritis of right hip 10/30/2015  . Pain of right upper arm 05/12/2015  . Hair loss 05/02/2012  . Prediabetes 02/20/2012  . Thrombosed  external hemorrhoid 02/08/2012  . Perirectal abscess 02/07/2012  . Post-nasal drip 11/27/2011  . Anxiety and depression 11/27/2011  . Arthritis of right hip 11/27/2011  . General medical examination 10/29/2011  . Cough 09/17/2011  . Obesity (BMI 30-39.9) 09/17/2011  . PNA (pneumonia) 08/03/2011  . Tobacco abuse 08/03/2011  . X-ray of lung, abnormal 06/11/2007  . GERD 06/11/2007    Social History   Tobacco Use  . Smoking status: Former Smoker    Packs/day: 1.00    Years: 26.00    Pack years: 26.00    Last attempt to quit: 07/20/2012    Years since quitting: 6.3  . Smokeless tobacco: Never Used  Substance Use Topics  . Alcohol use: Yes    Comment: occasional    Current Outpatient Medications:  .  albuterol (PROVENTIL HFA;VENTOLIN HFA) 108 (90 Base) MCG/ACT inhaler, Inhale 2 puffs into the lungs every 6 (six) hours as needed for wheezing or shortness of breath., Disp: 1 Inhaler, Rfl: 2 .  amphetamine-dextroamphetamine (ADDERALL) 20 MG tablet, Take 20 mg by mouth 2 (two) times daily. , Disp: , Rfl:  .  buPROPion (WELLBUTRIN XL) 150 MG 24 hr tablet, Take 150 mg by mouth every morning., Disp: , Rfl: 12 .  buPROPion (WELLBUTRIN XL) 300 MG 24 hr tablet, Take 300 mg by mouth daily with breakfast., Disp: , Rfl: 12 .  cyanocobalamin 1000 MCG tablet, Take 1,000 mcg by mouth daily., Disp: , Rfl:  .  diphenhydramine-acetaminophen (TYLENOL PM) 25-500 MG TABS tablet, Take 1-2 tablets by  mouth at bedtime., Disp: , Rfl:  .  FLUoxetine (PROZAC) 40 MG capsule, Take 40 mg by mouth daily with breakfast., Disp: , Rfl: 12 .  ibuprofen (ADVIL,MOTRIN) 800 MG tablet, ibuprofen 800 mg tablet  TAKE 1 TABLET 3 TIMES A DAY AS NEEDED FOR PAIN, Disp: , Rfl:  .  MAGNESIUM CITRATE PO, Take 150 mg by mouth 4 (four) times daily. , Disp: , Rfl:  .  Multiple Vitamin (MULTIVITAMIN WITH MINERALS) TABS tablet, Take 1 tablet by mouth daily., Disp: , Rfl:  .  omeprazole (PRILOSEC) 40 MG capsule, TAKE 1 CAPSULE BY MOUTH  EVERY DAY, Disp: 30 capsule, Rfl: 3 .  ondansetron (ZOFRAN-ODT) 4 MG disintegrating tablet, ondansetron 4 mg disintegrating tablet  TAKE 1 TABLET BY MOUTH EVERY 8 HOURS AS NEEDED FOR NAUSEA AND VOMITING, Disp: , Rfl:  .  OVER THE COUNTER MEDICATION, Take 5 capsules by mouth 3 (three) times daily. OTC. Intestinal Formula, Disp: , Rfl:  .  sucralfate (CARAFATE) 1 g tablet, TAKE 1 TABLET (1 G TOTAL) BY MOUTH 4 (FOUR) TIMES DAILY - WITH MEALS AND AT BEDTIME., Disp: 90 tablet, Rfl: 0 .  Wheat Dextrin (BENEFIBER PO), Take 10 mLs by mouth as directed. 2 Teaspoonfuls into 4-8 ounces of liquid daily, Disp: , Rfl:  .  predniSONE (DELTASONE) 10 MG tablet, Take 3 tablets by mouth x3 days, then 2 tablets by mouth x3 days, then 1 tablet by mouth x3 days, Disp: 18 tablet, Rfl: 0 .  SUMAtriptan (IMITREX) 50 MG tablet, Take 1 tablet (50 mg total) by mouth once for 1 dose. May repeat in 2 hours if headache persists or recurs., Disp: 30 tablet, Rfl: 3  No Known Allergies  Objective:   Pulse (!) 104   Temp 98.7 F (37.1 C)   Ht 5\' 6"  (1.676 m)   Wt 230 lb (104.3 kg)   BMI 37.12 kg/m    Assessment and Plan:   GERD- Deteriorated.  Check H pylori.  Continue carafate.  Switch to BID Protonix.  If no improvement, will need GI referral.  Pt expressed understanding and is in agreement w/ plan.   Migraine- no longer having daily headaches since daith piercing but continues to have 2-3/week.  Will switch to Maxalt as imitrex is causing side effects and refer to neurology.  Did not choose beta blocker prophylaxis as pt reports fatigue and recent weight gain.  Obesity- ongoing issue for pt.  She is interested in restarting her Phentermine.  Discussed that medication must be used w/ regular exercise and healthy diet and cannot be done in isolation.  Check labs to risk stratify.  Will follow.   Annye Asa, MD 11/21/2018

## 2018-11-21 NOTE — Progress Notes (Signed)
I have discussed the procedure for the virtual visit with the patient who has given consent to proceed with assessment and treatment.   Alanie Syler, CMA     

## 2018-11-24 ENCOUNTER — Other Ambulatory Visit (INDEPENDENT_AMBULATORY_CARE_PROVIDER_SITE_OTHER): Payer: BLUE CROSS/BLUE SHIELD

## 2018-11-24 DIAGNOSIS — K219 Gastro-esophageal reflux disease without esophagitis: Secondary | ICD-10-CM | POA: Diagnosis not present

## 2018-11-24 DIAGNOSIS — E669 Obesity, unspecified: Secondary | ICD-10-CM

## 2018-11-24 LAB — CBC WITH DIFFERENTIAL/PLATELET
Basophils Absolute: 0.1 10*3/uL (ref 0.0–0.1)
Basophils Relative: 0.7 % (ref 0.0–3.0)
Eosinophils Absolute: 0 10*3/uL (ref 0.0–0.7)
Eosinophils Relative: 0.5 % (ref 0.0–5.0)
HCT: 40.6 % (ref 36.0–46.0)
Hemoglobin: 13.6 g/dL (ref 12.0–15.0)
Lymphocytes Relative: 33.3 % (ref 12.0–46.0)
Lymphs Abs: 2.7 10*3/uL (ref 0.7–4.0)
MCHC: 33.4 g/dL (ref 30.0–36.0)
MCV: 90.7 fl (ref 78.0–100.0)
Monocytes Absolute: 0.3 10*3/uL (ref 0.1–1.0)
Monocytes Relative: 3.3 % (ref 3.0–12.0)
Neutro Abs: 5.1 10*3/uL (ref 1.4–7.7)
Neutrophils Relative %: 62.2 % (ref 43.0–77.0)
Platelets: 271 10*3/uL (ref 150.0–400.0)
RBC: 4.48 Mil/uL (ref 3.87–5.11)
RDW: 14.1 % (ref 11.5–15.5)
WBC: 8.3 10*3/uL (ref 4.0–10.5)

## 2018-11-24 LAB — BASIC METABOLIC PANEL
BUN: 11 mg/dL (ref 6–23)
CO2: 22 mEq/L (ref 19–32)
Calcium: 8.6 mg/dL (ref 8.4–10.5)
Chloride: 105 mEq/L (ref 96–112)
Creatinine, Ser: 0.94 mg/dL (ref 0.40–1.20)
GFR: 63.57 mL/min (ref >60.00)
Glucose, Bld: 157 mg/dL — ABNORMAL HIGH (ref 70–99)
Potassium: 3.9 mEq/L (ref 3.5–5.1)
Sodium: 136 mEq/L (ref 135–145)

## 2018-11-24 LAB — LIPID PANEL
Cholesterol: 175 mg/dL (ref 0–200)
HDL: 38.8 mg/dL — ABNORMAL LOW (ref >39.00)
LDL Cholesterol: 115 mg/dL — ABNORMAL HIGH (ref 0–99)
NonHDL: 136.62
Total CHOL/HDL Ratio: 5
Triglycerides: 108 mg/dL (ref 0.0–149.0)
VLDL: 21.6 mg/dL (ref 0.0–40.0)

## 2018-11-24 LAB — HEPATIC FUNCTION PANEL
ALT: 10 U/L (ref 0–35)
AST: 11 U/L (ref 0–37)
Albumin: 3.9 g/dL (ref 3.5–5.2)
Alkaline Phosphatase: 47 U/L (ref 39–117)
Bilirubin, Direct: 0.1 mg/dL (ref 0.0–0.3)
Total Bilirubin: 0.4 mg/dL (ref 0.2–1.2)
Total Protein: 6.4 g/dL (ref 6.0–8.3)

## 2018-11-24 LAB — H. PYLORI ANTIBODY, IGG: H Pylori IgG: NEGATIVE

## 2018-11-24 LAB — TSH: TSH: 1.78 u[IU]/mL (ref 0.35–4.50)

## 2018-11-25 ENCOUNTER — Other Ambulatory Visit (INDEPENDENT_AMBULATORY_CARE_PROVIDER_SITE_OTHER): Payer: BLUE CROSS/BLUE SHIELD

## 2018-11-25 DIAGNOSIS — R739 Hyperglycemia, unspecified: Secondary | ICD-10-CM

## 2018-11-25 LAB — HEMOGLOBIN A1C: Hgb A1c MFr Bld: 5.9 % (ref 4.6–6.5)

## 2018-12-03 DIAGNOSIS — F900 Attention-deficit hyperactivity disorder, predominantly inattentive type: Secondary | ICD-10-CM | POA: Diagnosis not present

## 2018-12-03 DIAGNOSIS — F3342 Major depressive disorder, recurrent, in full remission: Secondary | ICD-10-CM | POA: Diagnosis not present

## 2018-12-10 ENCOUNTER — Other Ambulatory Visit: Payer: Self-pay | Admitting: Family Medicine

## 2019-01-06 ENCOUNTER — Other Ambulatory Visit: Payer: Self-pay | Admitting: Family Medicine

## 2019-01-06 ENCOUNTER — Encounter: Payer: Self-pay | Admitting: General Practice

## 2019-01-06 NOTE — Telephone Encounter (Signed)
mychart message sent to pt to verify how she is feeling and if we need to proceed with GI referral.

## 2019-01-06 NOTE — Telephone Encounter (Signed)
If GERD sxs have not improved, we will need a GI referral and not just a refill

## 2019-01-06 NOTE — Telephone Encounter (Signed)
Please advise, should this be a long term med for pt?

## 2019-02-06 ENCOUNTER — Other Ambulatory Visit: Payer: Self-pay

## 2019-02-06 ENCOUNTER — Ambulatory Visit (INDEPENDENT_AMBULATORY_CARE_PROVIDER_SITE_OTHER): Payer: BC Managed Care – PPO | Admitting: Physician Assistant

## 2019-02-06 ENCOUNTER — Encounter: Payer: Self-pay | Admitting: Physician Assistant

## 2019-02-06 VITALS — BP 129/78 | HR 61 | Temp 97.7°F

## 2019-02-06 DIAGNOSIS — G43109 Migraine with aura, not intractable, without status migrainosus: Secondary | ICD-10-CM | POA: Diagnosis not present

## 2019-02-06 MED ORDER — BUTALBITAL-ASPIRIN-CAFFEINE 50-325-40 MG PO CAPS: 1.0000 | ORAL_CAPSULE | Freq: Two times a day (BID) | ORAL | 0 refills | Status: DC | PRN

## 2019-02-06 MED ORDER — ONDANSETRON 4 MG PO TBDP: ORAL_TABLET | ORAL | 0 refills | Status: DC

## 2019-02-06 NOTE — Progress Notes (Signed)
Virtual Visit via Video   I connected with patient on 02/06/19 at  4:00 PM EDT by a video enabled telemedicine application and verified that I am speaking with the correct person using two identifiers.  Location patient: Home Location provider: [facility] Persons participating in the virtual visit: Patient, Provider, PA-Student Anibal Henderson), CMA (Eduard Clos)  I discussed the limitations of evaluation and management by telemedicine and the availability of in person appointments. The patient expressed understanding and agreed to proceed.  Subjective:   HPI:  Patient presents via [facility] today c/o acute migraine headache starting this AM. Notes L-sided headache with photophobia, nausea with 4 episodes of vomiting since this morning. Denies hematemesis. Denies food products, coffee grounds in the emesis. Denies vision changes, change in medications. Notes she did not eat very much yesterday (protein bar for breakfast and ceasar salad for dinner). Was also cleaning the house yesterday and did not hydrate well. Notes headache is rated a 6/10 on the pain scale. Has taken Rizatriptan x 2 -- 9:30 and then 11:30. Notes not much improvement with the headache that she usually has. Has also taken 800 mg Ibuprofen about an hour and a half ago.  Has Rx for Zofran but has not taken today -- cannot find the prescription.   ROS:   See pertinent positives and negatives per HPI.  Patient Active Problem List   Diagnosis Date Noted  . Migraines 11/21/2018  . Postoperative anemia due to acute blood loss 06/16/2016  . Primary osteoarthritis of left hip 10/31/2015  . Primary osteoarthritis of right hip 10/30/2015  . Pain of right upper arm 05/12/2015  . Hair loss 05/02/2012  . Prediabetes 02/20/2012  . Thrombosed external hemorrhoid 02/08/2012  . Post-nasal drip 11/27/2011  . Anxiety and depression 11/27/2011  . Arthritis of right hip 11/27/2011  . General medical examination  10/29/2011  . Cough 09/17/2011  . Obesity (BMI 30-39.9) 09/17/2011  . Tobacco abuse 08/03/2011  . X-ray of lung, abnormal 06/11/2007  . GERD 06/11/2007    Social History   Tobacco Use  . Smoking status: Former Smoker    Packs/day: 1.00    Years: 26.00    Pack years: 26.00    Quit date: 07/20/2012    Years since quitting: 6.5  . Smokeless tobacco: Never Used  Substance Use Topics  . Alcohol use: Yes    Comment: occasional    Current Outpatient Medications:  .  albuterol (PROVENTIL HFA;VENTOLIN HFA) 108 (90 Base) MCG/ACT inhaler, Inhale 2 puffs into the lungs every 6 (six) hours as needed for wheezing or shortness of breath., Disp: 1 Inhaler, Rfl: 2 .  buPROPion (WELLBUTRIN XL) 150 MG 24 hr tablet, Take 150 mg by mouth every morning., Disp: , Rfl: 12 .  buPROPion (WELLBUTRIN XL) 300 MG 24 hr tablet, Take 300 mg by mouth daily with breakfast., Disp: , Rfl: 12 .  cyanocobalamin 1000 MCG tablet, Take 1,000 mcg by mouth daily., Disp: , Rfl:  .  diphenhydramine-acetaminophen (TYLENOL PM) 25-500 MG TABS tablet, Take 1-2 tablets by mouth at bedtime., Disp: , Rfl:  .  FLUoxetine (PROZAC) 40 MG capsule, Take 40 mg by mouth daily with breakfast., Disp: , Rfl: 12 .  ibuprofen (ADVIL,MOTRIN) 800 MG tablet, ibuprofen 800 mg tablet  TAKE 1 TABLET 3 TIMES A DAY AS NEEDED FOR PAIN, Disp: , Rfl:  .  Multiple Vitamin (MULTIVITAMIN WITH MINERALS) TABS tablet, Take 1 tablet by mouth daily., Disp: , Rfl:  .  omeprazole (PRILOSEC)  40 MG capsule, Take 40 mg by mouth daily., Disp: , Rfl:  .  ondansetron (ZOFRAN-ODT) 4 MG disintegrating tablet, ondansetron 4 mg disintegrating tablet  TAKE 1 TABLET BY MOUTH EVERY 8 HOURS AS NEEDED FOR NAUSEA AND VOMITING, Disp: , Rfl:  .  OVER THE COUNTER MEDICATION, Take 5 capsules by mouth 3 (three) times daily. OTC. Intestinal Formula, Disp: , Rfl:  .  phentermine 37.5 MG capsule, Take 1 capsule (37.5 mg total) by mouth every morning., Disp: 30 capsule, Rfl: 2 .   rizatriptan (MAXALT) 10 MG tablet, Take 1 tablet (10 mg total) by mouth as needed for migraine. May repeat in 2 hours if needed, Disp: 10 tablet, Rfl: 6 .  Wheat Dextrin (BENEFIBER PO), Take 10 mLs by mouth as directed. 2 Teaspoonfuls into 4-8 ounces of liquid daily, Disp: , Rfl:  .  amphetamine-dextroamphetamine (ADDERALL) 20 MG tablet, Take 20 mg by mouth 2 (two) times daily. , Disp: , Rfl:  .  MAGNESIUM CITRATE PO, Take 150 mg by mouth 4 (four) times daily. , Disp: , Rfl:  .  pantoprazole (PROTONIX) 40 MG tablet, Take 1 tablet (40 mg total) by mouth 2 (two) times daily. (Patient not taking: Reported on 02/06/2019), Disp: 60 tablet, Rfl: 3 .  sucralfate (CARAFATE) 1 g tablet, TAKE 1 TABLET (1 G TOTAL) BY MOUTH 4 (FOUR) TIMES DAILY - WITH MEALS AND AT BEDTIME. (Patient not taking: Reported on 02/06/2019), Disp: 90 tablet, Rfl: 0  No Known Allergies  Objective:   BP 129/78   Pulse 61   Temp 97.7 F (36.5 C) (Oral)   Patient is well-developed, well-nourished in no acute distress.  Resting comfortably at home.  Head is normocephalic, atraumatic.  No labored breathing.  Speech is clear and coherent with logical contest.  Patient is alert and oriented at baseline.   Assessment and Plan:   1. Migraine with aura and without status migrainosus, not intractable Multiple triggers likely -- chemicals from cleaning, decreased hydration and decreased intake. Rx Fiorinal for abortive therapy. Supportive measures and OTC medications reviewed. Strict ER precautions reviewed for non-aborting migraine.     Leeanne Rio, PA-C 02/06/2019

## 2019-02-06 NOTE — Progress Notes (Signed)
I have discussed the procedure for the virtual visit with the patient who has given consent to proceed with assessment and treatment.   Aarohi Redditt S Aldine Chakraborty, CMA     

## 2019-02-06 NOTE — Patient Instructions (Signed)
Instructions sent to MyChart.  Please keep hydrated and get plenty of rest. Take the Fiorinal as directed this evening as discussed after you get the Zofran in your system. Keep in a dark room. Resume diet when you feel able.  If symptoms are not resolving within next 24-48 hours or anything worsens, new symptoms develop, please go to the ER for assessment. Also if the Zofan is not helping you keep things on your stomach, please go to the ER as there is concern for dehydration and electrolyte imbalance   Migraine Headache  A migraine headache is a very strong throbbing pain on one side or both sides of your head. Migraines can also cause other symptoms. Talk with your doctor about what things may bring on (trigger) your migraine headaches. Follow these instructions at home: Medicines  Take over-the-counter and prescription medicines only as told by your doctor.  Do not drive or use heavy machinery while taking prescription pain medicine.  To prevent or treat constipation while you are taking prescription pain medicine, your doctor may recommend that you: ? Drink enough fluid to keep your pee (urine) clear or pale yellow. ? Take over-the-counter or prescription medicines. ? Eat foods that are high in fiber. These include fresh fruits and vegetables, whole grains, and beans. ? Limit foods that are high in fat and processed sugars. These include fried and sweet foods. Lifestyle  Avoid alcohol.  Do not use any products that contain nicotine or tobacco, such as cigarettes and e-cigarettes. If you need help quitting, ask your doctor.  Get at least 8 hours of sleep every night.  Limit your stress. General instructions   Keep a journal to find out what may bring on your migraines. For example, write down: ? What you eat and drink. ? How much sleep you get. ? Any change in what you eat or drink. ? Any change in your medicines.  If you have a migraine: ? Avoid things that make your  symptoms worse, such as bright lights. ? It may help to lie down in a dark, quiet room. ? Do not drive or use heavy machinery. ? Ask your doctor what activities are safe for you.  Keep all follow-up visits as told by your doctor. This is important. Contact a doctor if:  You get a migraine that is different or worse than your usual migraines. Get help right away if:  Your migraine gets very bad.  You have a fever.  You have a stiff neck.  You have trouble seeing.  Your muscles feel weak or like you cannot control them.  You start to lose your balance a lot.  You start to have trouble walking.  You pass out (faint). This information is not intended to replace advice given to you by your health care provider. Make sure you discuss any questions you have with your health care provider. Document Released: 05/15/2008 Document Revised: 04/30/2018 Document Reviewed: 01/23/2016 Elsevier Interactive Patient Education  2019 [employer].

## 2019-02-15 ENCOUNTER — Other Ambulatory Visit: Payer: Self-pay | Admitting: Family Medicine

## 2019-02-22 ENCOUNTER — Other Ambulatory Visit: Payer: Self-pay | Admitting: Physician Assistant

## 2019-02-22 ENCOUNTER — Other Ambulatory Visit: Payer: Self-pay | Admitting: Family Medicine

## 2019-02-23 NOTE — Telephone Encounter (Signed)
Not meant to be used long term.  Was supposed to be a 3 month jump start.

## 2019-02-23 NOTE — Telephone Encounter (Signed)
Last refill:11/21/18 #30, 2 Last OV:11/21/18

## 2019-03-02 ENCOUNTER — Ambulatory Visit: Payer: Self-pay | Admitting: Family Medicine

## 2019-03-04 ENCOUNTER — Encounter: Payer: Self-pay | Admitting: Family Medicine

## 2019-03-04 ENCOUNTER — Ambulatory Visit (INDEPENDENT_AMBULATORY_CARE_PROVIDER_SITE_OTHER): Payer: BC Managed Care – PPO | Admitting: Family Medicine

## 2019-03-04 ENCOUNTER — Other Ambulatory Visit: Payer: Self-pay

## 2019-03-04 VITALS — BP 123/80 | HR 86 | Ht 66.0 in | Wt 192.0 lb

## 2019-03-04 DIAGNOSIS — K5909 Other constipation: Secondary | ICD-10-CM

## 2019-03-04 DIAGNOSIS — G43909 Migraine, unspecified, not intractable, without status migrainosus: Secondary | ICD-10-CM | POA: Diagnosis not present

## 2019-03-04 DIAGNOSIS — E669 Obesity, unspecified: Secondary | ICD-10-CM

## 2019-03-04 NOTE — Progress Notes (Signed)
Virtual Visit via Video   I connected with patient on 03/04/19 at  4:00 PM EDT by a video enabled telemedicine application and verified that I am speaking with the correct person using two identifiers.  Location patient: Home Location provider: [facility], Office Persons participating in the virtual visit: Patient, Provider, Bartolo (Jess B)  I discussed the limitations of evaluation and management by telemedicine and the availability of in person appointments. The patient expressed understanding and agreed to proceed.  Subjective:   HPI:   Obesity- pt was started on phentermine on 4/30 and has since dropped 38 lbs.  Pt started doing Nutrisystem in addition to Phentermine.  She is doing some walking, 'but not a whole lot of exercise'.  Pt ran out of phentermine and noticed an increase in cravings.  Pt is now taking Probiotic and 'Cleaner'.  Pt reports the Cleaner causes diarrhea 1-2x/day.  Denies palpitations.  No CP or SOB.  Denies increased anxiety.  Pt would like to get down between 175-185 and would like to continue phentermine until that time.  Migraines- pt reports sxs had been improving but recently had a severe migraine that resulted in vomiting all day.  Took both Rizatriptan and Sumatriptan w/o relief.  Took Butalbital w/o relief.  Had tragus pierced w/ some relief.  Currently having 'a couple times a month'  ROS:   See pertinent positives and negatives per HPI.  Patient Active Problem List   Diagnosis Date Noted   Migraines 11/21/2018   Postoperative anemia due to acute blood loss 06/16/2016   Primary osteoarthritis of left hip 10/31/2015   Primary osteoarthritis of right hip 10/30/2015   Pain of right upper arm 05/12/2015   Hair loss 05/02/2012   Prediabetes 02/20/2012   Thrombosed external hemorrhoid 02/08/2012   Post-nasal drip 11/27/2011   Anxiety and depression 11/27/2011   Arthritis of right hip 11/27/2011   General medical examination  10/29/2011   Cough 09/17/2011   Obesity (BMI 30-39.9) 09/17/2011   Tobacco abuse 08/03/2011   X-ray of lung, abnormal 06/11/2007   GERD 06/11/2007    Social History   Tobacco Use   Smoking status: Former Smoker    Packs/day: 1.00    Years: 26.00    Pack years: 26.00    Quit date: 07/20/2012    Years since quitting: 6.6   Smokeless tobacco: Never Used  Substance Use Topics   Alcohol use: Yes    Comment: occasional    Current Outpatient Medications:    amphetamine-dextroamphetamine (ADDERALL) 20 MG tablet, Take 20 mg by mouth 2 (two) times daily. , Disp: , Rfl:    buPROPion (WELLBUTRIN XL) 300 MG 24 hr tablet, Take 300 mg by mouth daily with breakfast., Disp: , Rfl: 12   butalbital-aspirin-caffeine (FIORINAL) 50-325-40 MG capsule, Take 1 capsule by mouth 2 (two) times daily as needed for migraine., Disp: 14 capsule, Rfl: 0   cyanocobalamin 1000 MCG tablet, Take 1,000 mcg by mouth daily., Disp: , Rfl:    diphenhydramine-acetaminophen (TYLENOL PM) 25-500 MG TABS tablet, Take 1-2 tablets by mouth at bedtime., Disp: , Rfl:    FLUoxetine (PROZAC) 40 MG capsule, Take 40 mg by mouth daily with breakfast., Disp: , Rfl: 12   ondansetron (ZOFRAN-ODT) 4 MG disintegrating tablet, ondansetron 4 mg disintegrating tablet  TAKE 1 TABLET BY MOUTH EVERY 8 HOURS AS NEEDED FOR NAUSEA AND VOMITING, Disp: 20 tablet, Rfl: 0   OVER THE COUNTER MEDICATION, Nucific (probiotic) 2 tablets after every meal 3 times a  day The cleaner- for women 14 day (pt takes every day- 4 tablets in the morning and 4 tablets in the evening), Disp: , Rfl:    pantoprazole (PROTONIX) 40 MG tablet, TAKE 1 TABLET BY MOUTH TWICE A DAY, Disp: 180 tablet, Rfl: 1   phentermine 37.5 MG capsule, Take 1 capsule (37.5 mg total) by mouth every morning., Disp: 30 capsule, Rfl: 2   rizatriptan (MAXALT) 10 MG tablet, Take 1 tablet (10 mg total) by mouth as needed for migraine. May repeat in 2 hours if needed, Disp: 10 tablet,  Rfl: 6   Wheat Dextrin (BENEFIBER PO), Take 10 mLs by mouth as directed. 2 Teaspoonfuls into 4-8 ounces of liquid daily, Disp: , Rfl:    buPROPion (WELLBUTRIN XL) 150 MG 24 hr tablet, Take 150 mg by mouth every morning., Disp: , Rfl: 12   ibuprofen (ADVIL,MOTRIN) 800 MG tablet, ibuprofen 800 mg tablet  TAKE 1 TABLET 3 TIMES A DAY AS NEEDED FOR PAIN, Disp: , Rfl:   No Known Allergies  Objective:   BP 123/80    Pulse 86    Ht 5\' 6"  (1.676 m)    Wt 192 lb (87.1 kg)    BMI 30.99 kg/m  AAOx3, NAD NCAT, EOMI No obvious CN deficits Coloring WNL Pt is able to speak clearly, coherently without shortness of breath or increased work of breathing.  Thought process is linear.  Mood is appropriate.   Assessment and Plan:   Obesity- pt is down 38 lbs since 4/30.  She has been doing Nutrisystem, taking the Phentermine, using 'The Cleaner', and probiotics.  She reports minimal exercise- which I encouraged her to increase.  I cautioned her against the use of OTC supplements and laxatives that can cause diarrhea and electrolyte disturbances.  Discussed that this is not the best way to lose weight and could be dangerous.  Migraines- chronic problem.  Pt had temporary relief after she had tragus pierced but is again having HAs and they now seem more severe.  Last migraine she did not have relief w/ 2 triptans nor butalbital.  Given the worsening severity will refer to Neuro for a complete evaluation and tx.  Pt expressed understanding and is in agreement w/ plan.   Chronic constipation- pt is now using 'Cleaner' an OTC weight loss product that is causing her to have diarrhea 2x/day.  Pt is pleased with the results but we discussed that taking a 7 day formula carried risks- including electrolyte abnormalities and her system becoming dependent on its use.  Will check CMP to assess.   Annye Asa, MD 03/04/2019

## 2019-03-04 NOTE — Progress Notes (Signed)
I have discussed the procedure for the virtual visit with the patient who has given consent to proceed with assessment and treatment.   Seini Lannom L Yolanda Dockendorf, CMA     

## 2019-03-10 ENCOUNTER — Other Ambulatory Visit: Payer: Self-pay

## 2019-03-10 ENCOUNTER — Ambulatory Visit (INDEPENDENT_AMBULATORY_CARE_PROVIDER_SITE_OTHER): Payer: BC Managed Care – PPO

## 2019-03-10 DIAGNOSIS — E669 Obesity, unspecified: Secondary | ICD-10-CM | POA: Diagnosis not present

## 2019-03-10 LAB — HEPATIC FUNCTION PANEL
ALT: 8 U/L (ref 0–35)
AST: 11 U/L (ref 0–37)
Albumin: 4 g/dL (ref 3.5–5.2)
Alkaline Phosphatase: 34 U/L — ABNORMAL LOW (ref 39–117)
Bilirubin, Direct: 0.1 mg/dL (ref 0.0–0.3)
Total Bilirubin: 0.3 mg/dL (ref 0.2–1.2)
Total Protein: 6.1 g/dL (ref 6.0–8.3)

## 2019-03-10 LAB — BASIC METABOLIC PANEL
BUN: 8 mg/dL (ref 6–23)
CO2: 27 mEq/L (ref 19–32)
Calcium: 8.6 mg/dL (ref 8.4–10.5)
Chloride: 104 mEq/L (ref 96–112)
Creatinine, Ser: 0.77 mg/dL (ref 0.40–1.20)
GFR: 79.93 mL/min (ref >60.00)
Glucose, Bld: 71 mg/dL (ref 70–99)
Potassium: 4.1 mEq/L (ref 3.5–5.1)
Sodium: 138 mEq/L (ref 135–145)

## 2019-03-11 ENCOUNTER — Other Ambulatory Visit: Payer: Self-pay | Admitting: Family Medicine

## 2019-03-11 MED ORDER — PHENTERMINE HCL 37.5 MG PO CAPS: 37.5000 mg | ORAL_CAPSULE | ORAL | 2 refills | Status: DC

## 2019-03-11 NOTE — Progress Notes (Unsigned)
Filled at pt's request 

## 2019-03-12 NOTE — Progress Notes (Signed)
Virtual Visit via Video Note The purpose of this virtual visit is to provide medical care while limiting exposure to the novel coronavirus.    Consent was obtained for video visit:  Yes.   Answered questions that patient had about telehealth interaction:  Yes.   I discussed the limitations, risks, security and privacy concerns of performing an evaluation and management service by telemedicine. I also discussed with the patient that there may be a patient responsible charge related to this service. The patient expressed understanding and agreed to proceed.  Pt location: Home Physician Location: office Name of referring provider:  Midge Minium, MD I connected with Emma Wagner at patients initiation/request on 03/13/2019 at  7:50 AM EDT by video enabled telemedicine application and verified that I am speaking with the correct person using two identifiers. Pt MRN:  [id number] Pt DOB:  07-Sep-1970 Video Participants:  Emma Wagner   History of Present Illness:  Emma Wagner is a 48 year old woman with history of kidney stones who presents for migraines.  History supplemented by referring provider note.  Onset:  Off and on since 83s.  Increased frequency over the years. She wakes up with some sort of headache daily.   Location:  Either left or right sided (including periorbital area) Quality:  Sometimes throbbing and sometimes non-throbbing Intensity:  8/10.  She denies new headache, thunderclap headache or severe headache that wakes her from sleep. Aura:  no Premonitory Phase:  no Postdrome:  Fatigued, exhausted for a day Associated symptoms:  Nausea, vomiting, photophobia, phonophobia, osmophobia, .  She denies associated visual disturbance, autonomic symptoms, or unilateral numbness or weakness. Duration:  2 to 3 days Frequency:  2 to 3 days a week Triggers:  Unknown- Relieving factors:  Sleep in dark and quiet room, meditation Activity:  Aggravates She wakes up every  morning with some type of diffuse dull headache.  She treats these headaches every morning with either rizatriptan, Fioricet (for about a year)  Current NSAIDS:  none Current analgesics:  Tylenol PM, Fioricet (ineffective) Current triptans:  Rizatriptan 10mg , Sumatriptan 50mg  (previously effective) Current ergotamine:  none Current anti-emetic:  Zofran-ODT 4mg  Current muscle relaxants:  none Current anti-anxiolytic:  none Current sleep aide:  Tylenol PM Current Antihypertensive medications:  none Current Antidepressant medications:  Wellbutrin, Prozac 40mg  Current Anticonvulsant medications:  none Current anti-CGRP:  none Current Vitamins/Herbal/Supplements:  none Current Antihistamines/Decongestants:  Tylenol PM Other therapy:  Meditation, Daith piercing in both ears (helped) Hormone/birth control:  none Other medications:  Adderall  Past NSAIDS:  ASA, Celebrex, ibuprofen Past analgesics:  Tylenol, Excedrin Migraine Past abortive triptans:  none Past abortive ergotamine:  none Past muscle relaxants:  Flexeril, Robaxin Past anti-emetic:  none Past antihypertensive medications:  None.  However, she reports history of dizziness. Past antidepressant medications:  Venlafaxine Past anticonvulsant medications:  gabapentin Past anti-CGRP:  none Past vitamins/Herbal/Supplements:  none Past antihistamines/decongestants:  Flonase, Allegra Other past therapies:  none  Caffeine:  1 cup of coffee daily Diet:  Improved diet to lose weight.  Increased water intake.  No soda. Exercise:  yes Depression:  controlled; Anxiety:  controlled Other pain:  History of neck and back pain. Sleep hygiene:  Difficult to fall asleep. With headache, she may wake up during the night. Family history of headache:  Mother  MRI of cervical spine from 06/19/18 personally reviewed and showed broad-based posterior disc osteophyte complex at C5-6 causing mild spinal stenosis and moderate to severe right and  mild-moderate  left neural foraminal stenosis.  Past Medical History: Past Medical History:  Diagnosis Date  . Anxiety    add also  . Arthritis   . Borderline type 2 diabetes mellitus since 2013   -lost weight and quit smoking and is no longer PRE diabetic  . Bursitis    bilateral hips, more on right hip  . Chronic kidney disease    h/o kidney stone 2016  . Constipation   . Depression   . Frequent urination    while kidney stone evident  . GERD (gastroesophageal reflux disease)   . Headache    "every blue moon. not often"  . Hemorrhoids   . History of kidney stones   . Pneumonia   . PONV (postoperative nausea and vomiting)    nausea with appendectomy and with hip replacement in March 2017    Medications: Outpatient Encounter Medications as of 03/13/2019  Medication Sig  . amphetamine-dextroamphetamine (ADDERALL) 20 MG tablet Take 20 mg by mouth 2 (two) times daily.   Marland Kitchen buPROPion (WELLBUTRIN XL) 150 MG 24 hr tablet Take 150 mg by mouth every morning.  Marland Kitchen buPROPion (WELLBUTRIN XL) 300 MG 24 hr tablet Take 300 mg by mouth daily with breakfast.  . butalbital-aspirin-caffeine (FIORINAL) 50-325-40 MG capsule Take 1 capsule by mouth 2 (two) times daily as needed for migraine.  . cyanocobalamin 1000 MCG tablet Take 1,000 mcg by mouth daily.  . diphenhydramine-acetaminophen (TYLENOL PM) 25-500 MG TABS tablet Take 1-2 tablets by mouth at bedtime.  Marland Kitchen FLUoxetine (PROZAC) 40 MG capsule Take 40 mg by mouth daily with breakfast.  . ibuprofen (ADVIL,MOTRIN) 800 MG tablet ibuprofen 800 mg tablet  TAKE 1 TABLET 3 TIMES A DAY AS NEEDED FOR PAIN  . ondansetron (ZOFRAN-ODT) 4 MG disintegrating tablet ondansetron 4 mg disintegrating tablet  TAKE 1 TABLET BY MOUTH EVERY 8 HOURS AS NEEDED FOR NAUSEA AND VOMITING  . OVER THE COUNTER MEDICATION Nucific (probiotic) 2 tablets after every meal 3 times a day The cleaner- for women 14 day (pt takes every day- 4 tablets in the morning and 4 tablets in the  evening)  . pantoprazole (PROTONIX) 40 MG tablet TAKE 1 TABLET BY MOUTH TWICE A DAY  . phentermine 37.5 MG capsule Take 1 capsule (37.5 mg total) by mouth every morning.  . rizatriptan (MAXALT) 10 MG tablet Take 1 tablet (10 mg total) by mouth as needed for migraine. May repeat in 2 hours if needed  . Wheat Dextrin (BENEFIBER PO) Take 10 mLs by mouth as directed. 2 Teaspoonfuls into 4-8 ounces of liquid daily   No facility-administered encounter medications on file as of 03/13/2019.     Allergies: No Known Allergies  Family History: Family History  Problem Relation Age of Onset  . Heart failure Other   . Hypertension Other   . Cancer Other   . Anxiety disorder Other   . Depression Other   . Arthritis Mother   . Hypertension Mother   . Prostate cancer Father   . Skin cancer Father   . Congestive Heart Failure Father   . Hypertension Father     Social History: Social History   Socioeconomic History  . Marital status: Single    Spouse name: Not on file  . Number of children: Not on file  . Years of education: Not on file  . Highest education level: Not on file  Occupational History  . Not on file  Social Needs  . Financial resource strain: Not on file  .  Food insecurity    Worry: Not on file    Inability: Not on file  . Transportation needs    Medical: Not on file    Non-medical: Not on file  Tobacco Use  . Smoking status: Former Smoker    Packs/day: 1.00    Years: 26.00    Pack years: 26.00    Quit date: 07/20/2012    Years since quitting: 6.6  . Smokeless tobacco: Never Used  Substance and Sexual Activity  . Alcohol use: Yes    Comment: occasional  . Drug use: No  . Sexual activity: Yes    Birth control/protection: None  Lifestyle  . Physical activity    Days per week: Not on file    Minutes per session: Not on file  . Stress: Not on file  Relationships  . Social Herbalist on phone: Not on file    Gets together: Not on file    Attends  religious service: Not on file    Active member of club or organization: Not on file    Attends meetings of clubs or organizations: Not on file    Relationship status: Not on file  . Intimate partner violence    Fear of current or ex partner: Not on file    Emotionally abused: Not on file    Physically abused: Not on file    Forced sexual activity: Not on file  Other Topics Concern  . Not on file  Social History Narrative  . Not on file    Observations/Objective:   Height 5\' 6"  (1.676 m), weight 192 lb (87.1 kg). No acute distress.  Alert and oriented.  Speech fluent and not dysarthric.  Language intact.  Eyes orthophoric on primary gaze.  Face symmetric.  Assessment and Plan:   Migraine without aura, without status migrainosus, intractable Medication overuse headache  1. Stop sumatriptan, rizatriptan, Fioricet.  2. For preventative management, topiramate 25mg  at bedtime for 1 week, then 50mg  at bedtime.  If headaches not improved in 5 weeks, then increase to 75mg  at bedtime 3.  For abortive therapy, Ubrelvy 100mg  4.  Limit use of pain relievers to no more than 2 days out of week to prevent risk of rebound or medication-overuse headache. 5.  Keep headache diary 6.  Exercise, hydration, caffeine cessation, sleep hygiene, monitor for and avoid triggers 7.  Consider:  magnesium citrate 400mg  daily, riboflavin 400mg  daily, and coenzyme Q10 100mg  three times daily 8. Always keep in mind that currently taking a hormone or birth control may be a possible trigger or aggravating factor for migraine. 9. Follow up 4 months   Follow Up Instructions:    -I discussed the assessment and treatment plan with the patient. The patient was provided an opportunity to ask questions and all were answered. The patient agreed with the plan and demonstrated an understanding of the instructions.   The patient was advised to call back or seek an in-person evaluation if the symptoms worsen or if the  condition fails to improve as anticipated.   Dudley Major, DO

## 2019-03-13 ENCOUNTER — Encounter: Payer: Self-pay | Admitting: Neurology

## 2019-03-13 ENCOUNTER — Other Ambulatory Visit: Payer: Self-pay

## 2019-03-13 ENCOUNTER — Telehealth (INDEPENDENT_AMBULATORY_CARE_PROVIDER_SITE_OTHER): Payer: BC Managed Care – PPO | Admitting: Neurology

## 2019-03-13 VITALS — Ht 66.0 in | Wt 192.0 lb

## 2019-03-13 DIAGNOSIS — G4440 Drug-induced headache, not elsewhere classified, not intractable: Secondary | ICD-10-CM | POA: Diagnosis not present

## 2019-03-13 DIAGNOSIS — G43019 Migraine without aura, intractable, without status migrainosus: Secondary | ICD-10-CM | POA: Diagnosis not present

## 2019-03-13 MED ORDER — TOPIRAMATE 25 MG PO TABS: ORAL_TABLET | ORAL | 0 refills | Status: DC

## 2019-03-13 MED ORDER — UBRELVY 100 MG PO TABS: 1.0000 | ORAL_TABLET | ORAL | 3 refills | Status: DC | PRN

## 2019-03-27 ENCOUNTER — Telehealth: Payer: Self-pay | Admitting: Neurology

## 2019-03-27 NOTE — Telephone Encounter (Signed)
Med -Roselyn Meier medication was not covered by the insurance. Thanks!

## 2019-03-30 NOTE — Telephone Encounter (Signed)
Called and asked Pt's permission to text picture of Emma Wagner copay card to her, she said was ok.  Also went over topiramate and when can increase to 75 mg if needed.   BIN: [id number] PCN: [id number] GRP: [id number] ID#:  [id number]

## 2019-04-04 ENCOUNTER — Other Ambulatory Visit: Payer: Self-pay | Admitting: Neurology

## 2019-04-13 ENCOUNTER — Other Ambulatory Visit: Payer: Self-pay | Admitting: Family Medicine

## 2019-04-23 ENCOUNTER — Encounter: Payer: Self-pay | Admitting: *Deleted

## 2019-04-23 NOTE — Progress Notes (Unsigned)
Emma Wagner (Emma Wagner) Rx #[id number] Emma Wagner 100MG  tablets   Form [employer] [employer] Electronic Request Form (CB) Created 7 days ago Sent to Plan 3 minutes ago Plan Response 3 minutes ago Submit Clinical Questions less than a minute ago Determination Favorable less than a minute ago Message from Plan Effective from 04/23/2019 through 07/15/2019.

## 2019-05-05 ENCOUNTER — Other Ambulatory Visit: Payer: Self-pay | Admitting: Neurology

## 2019-06-07 ENCOUNTER — Other Ambulatory Visit: Payer: Self-pay | Admitting: Neurology

## 2019-06-15 ENCOUNTER — Other Ambulatory Visit: Payer: Self-pay | Admitting: Physician Assistant

## 2019-07-10 ENCOUNTER — Other Ambulatory Visit: Payer: Self-pay | Admitting: Family Medicine

## 2019-07-10 ENCOUNTER — Other Ambulatory Visit: Payer: Self-pay | Admitting: Neurology

## 2019-07-20 NOTE — Progress Notes (Signed)
Virtual Visit via Video Note The purpose of this virtual visit is to provide medical care while limiting exposure to the novel coronavirus.    Consent was obtained for video visit:  Yes.   Answered questions that patient had about telehealth interaction:  Yes.   I discussed the limitations, risks, security and privacy concerns of performing an evaluation and management service by telemedicine. I also discussed with the patient that there may be a patient responsible charge related to this service. The patient expressed understanding and agreed to proceed.  Pt location: Home Physician Location: office Name of referring provider:  Midge Minium, MD I connected with Emma Wagner at patients initiation/request on 07/21/2019 at  8:30 AM EST by video enabled telemedicine application and verified that I am speaking with the correct person using two identifiers. Pt MRN:  [id number] Pt DOB:  12/21/1970 Video Participants:  Emma Wagner   History of Present Illness:  Emma Wagner is a 48 year old woman with history of kidney stones who follows up for migraines.  UPDATE: Started on topiramate.  Prescribed Ubrelvy.  Intensity:  8/10 Duration:  Usually 1 hour, rarely 2 hours. Frequency:  10-12 headaches in past 30 days (4 are what she considers migraine) Current NSAIDS:  none Current analgesics:  Tylenol PM, Fioricet (ineffective) Current triptans:  none Current ergotamine:  none Current anti-emetic:  Zofran-ODT 4mg  Current muscle relaxants:  none Current anti-anxiolytic:  none Current sleep aide:  Tylenol PM Current Antihypertensive medications:  none Current Antidepressant medications:  Wellbutrin, Prozac 40mg  Current Anticonvulsant medications:  topiramate 75mg  at bedtime Current anti-CGRP:  Ubrelvy 100mg  Current Vitamins/Herbal/Supplements:  none Current Antihistamines/Decongestants:  Tylenol PM Other therapy:  Meditation, Daith piercing in both ears (helped) Hormone/birth  control:  none Other medications:  Adderall  Caffeine:  1 cup of coffee daily Diet:  Improved diet to lose weight.  Increased water intake.  No soda. Exercise:  yes Depression:  controlled; Anxiety:  controlled Other pain:  History of neck and back pain. Sleep hygiene:  Difficult to fall asleep. With headache, she may wake up during the night.  HISTORY: Onset:.  Increased frequency over the years. She wakes up with some sort of headache daily.   Location:  Either left or right sided (including periorbital area) Quality:  Sometimes throbbing and sometimes non-throbbing Initial intensity:  8/10.  She denies new headache, thunderclap headache or severe headache that wakes her from sleep. Aura:  no Premonitory Phase:  no Postdrome:  Fatigued, exhausted for a day Associated symptoms:  Nausea, vomiting, photophobia, phonophobia, osmophobia, .  She denies associated visual disturbance, autonomic symptoms, or unilateral numbness or weakness. Initial Duration:  2 to 3 days Initial Frequency:  Daily headaches; 2 to 3 days of migraine a week Triggers:  Unknown Relieving factors:  Sleep in dark and quiet room, meditation Activity:  Aggravates She wakes up every morning with some type of diffuse dull headache.  She treats these headaches every morning with either rizatriptan, Fioricet (for about a year)  Past NSAIDS:  ASA, Celebrex, ibuprofen Past analgesics:  Tylenol, Excedrin Migraine, Fioricet Past abortive triptans:  Rizatriptan 10mg , Sumatriptan 50mg  (previously effective) Past abortive ergotamine:  none Past muscle relaxants:  Flexeril, Robaxin Past anti-emetic:  none Past antihypertensive medications:  None.  However, she reports history of dizziness. Past antidepressant medications:  Venlafaxine Past anticonvulsant medications:  gabapentin Past anti-CGRP:  none Past vitamins/Herbal/Supplements:  none Past antihistamines/decongestants:  Flonase, Allegra Other past therapies:  none  Family history of headache:  Mother  MRI of cervical spine from 06/19/18 personally reviewed and showed broad-based posterior disc osteophyte complex at C5-6 causing mild spinal stenosis and moderate to severe right and mild-moderate left neural foraminal stenosis.  Past Medical History: Past Medical History:  Diagnosis Date  . Anxiety    add also  . Arthritis   . Borderline type 2 diabetes mellitus since 2013   -lost weight and quit smoking and is no longer PRE diabetic  . Bursitis    bilateral hips, more on right hip  . Chronic kidney disease    h/o kidney stone 2016  . Constipation   . Depression   . Frequent urination    while kidney stone evident  . GERD (gastroesophageal reflux disease)   . Headache    "every blue moon. not often"  . Hemorrhoids   . History of kidney stones   . Pneumonia   . PONV (postoperative nausea and vomiting)    nausea with appendectomy and with hip replacement in March 2017    Medications: Outpatient Encounter Medications as of 07/21/2019  Medication Sig  . amphetamine-dextroamphetamine (ADDERALL) 20 MG tablet Take 20 mg by mouth 2 (two) times daily.   Marland Kitchen buPROPion (WELLBUTRIN XL) 150 MG 24 hr tablet Take 150 mg by mouth every morning.  Marland Kitchen buPROPion (WELLBUTRIN XL) 300 MG 24 hr tablet Take 300 mg by mouth daily with breakfast.  . cyanocobalamin 1000 MCG tablet Take 1,000 mcg by mouth daily.  . diphenhydramine-acetaminophen (TYLENOL PM) 25-500 MG TABS tablet Take 1-2 tablets by mouth at bedtime.  Marland Kitchen FLUoxetine (PROZAC) 40 MG capsule Take 40 mg by mouth daily with breakfast.  . ibuprofen (ADVIL,MOTRIN) 800 MG tablet ibuprofen 800 mg tablet  TAKE 1 TABLET 3 TIMES A DAY AS NEEDED FOR PAIN  . omeprazole (PRILOSEC) 40 MG capsule TAKE 1 CAPSULE EVERY DAY  . ondansetron (ZOFRAN-ODT) 4 MG disintegrating tablet TAKE 1 TABLET BY MOUTH EVERY 8 HOURS AS NEEDED FOR NAUSEA AND VOMITING  . OVER THE COUNTER MEDICATION Nucific (probiotic) 2 tablets after  every meal 3 times a day The cleaner- for women 14 day (pt takes every day- 4 tablets in the morning and 4 tablets in the evening)  . pantoprazole (PROTONIX) 40 MG tablet TAKE 1 TABLET BY MOUTH TWICE A DAY  . phentermine 37.5 MG capsule Take 1 capsule (37.5 mg total) by mouth every morning.  . SUMAtriptan (IMITREX) 50 MG tablet Take 50 mg by mouth every 2 (two) hours as needed for migraine. May repeat in 2 hours if headache persists or recurs.  . topiramate (TOPAMAX) 25 MG tablet TAKE 3 TABLETS AT BEDTIME  . Ubrogepant (UBRELVY) 100 MG TABS Take 1 tablet by mouth as needed (May repeat dose in 2 hours if needed.  Maximum 2 tablets in 24 hours).  . Wheat Dextrin (BENEFIBER PO) Take 10 mLs by mouth as directed. 2 Teaspoonfuls into 4-8 ounces of liquid daily  . [DISCONTINUED] ondansetron (ZOFRAN-ODT) 4 MG disintegrating tablet TAKE 1 TABLET BY MOUTH EVERY 8 HOURS AS NEEDED FOR NAUSEA AND VOMITING  . [DISCONTINUED] topiramate (TOPAMAX) 25 MG tablet Take 1 tablet (25 mg total) by mouth at bedtime. Take 75mg  total at bedtime   No facility-administered encounter medications on file as of 07/21/2019.     Allergies: No Known Allergies  Family History: Family History  Problem Relation Age of Onset  . Heart failure Other   . Hypertension Other   . Cancer Other   .  Anxiety disorder Other   . Depression Other   . Arthritis Mother   . Hypertension Mother   . Prostate cancer Father   . Skin cancer Father   . Congestive Heart Failure Father   . Hypertension Father     Social History: Social History   Socioeconomic History  . Marital status: Single    Spouse name: Not on file  . Number of children: 2  . Years of education: 57  . Highest education level: 12th grade  Occupational History  . Occupation: unemployed  Social Needs  . Financial resource strain: Not on file  . Food insecurity    Worry: Not on file    Inability: Not on file  . Transportation needs    Medical: Not on file     Non-medical: Not on file  Tobacco Use  . Smoking status: Former Smoker    Packs/day: 1.00    Years: 26.00    Pack years: 26.00    Quit date: 07/20/2012    Years since quitting: 7.0  . Smokeless tobacco: Never Used  Substance and Sexual Activity  . Alcohol use: Yes    Comment: occasional  . Drug use: No  . Sexual activity: Yes    Birth control/protection: None  Lifestyle  . Physical activity    Days per week: Not on file    Minutes per session: Not on file  . Stress: Not on file  Relationships  . Social Herbalist on phone: Not on file    Gets together: Not on file    Attends religious service: Not on file    Active member of club or organization: Not on file    Attends meetings of clubs or organizations: Not on file    Relationship status: Not on file  . Intimate partner violence    Fear of current or ex partner: Not on file    Emotionally abused: Not on file    Physically abused: Not on file    Forced sexual activity: Not on file  Other Topics Concern  . Not on file  Social History Narrative   Patient is right-handed. She lives in a 2 level home. Drinks one cup of coffee a day. She walks daily.    Observations/Objective:   Height 5\' 6"  (1.676 m), weight 177 lb (80.3 kg). No acute distress.  Alert and oriented.  Speech fluent and not dysarthric.  Language intact.  Eyes orthophoric on primary gaze.  Face symmetric.  Assessment and Plan:   Migraine without aura, without status migrainosus, not intractable  1.  For preventative management, we will increase topiramate to 100mg  at bedtime in order to further reduce headache frequency 2.  For abortive therapy, Ubrelvy 100mg  3.  Limit use of pain relievers to no more than 2 days out of week to prevent risk of rebound or medication-overuse headache. 4.  Keep headache diary 5.  Exercise, hydration, caffeine cessation, sleep hygiene, monitor for and avoid triggers 6.  Consider:  magnesium citrate 400mg  daily,  riboflavin 400mg  daily, and coenzyme Q10 100mg  three times daily 7. Follow up 4 months.  Follow Up Instructions:    -I discussed the assessment and treatment plan with the patient. The patient was provided an opportunity to ask questions and all were answered. The patient agreed with the plan and demonstrated an understanding of the instructions.   The patient was advised to call back or seek an in-person evaluation if the symptoms worsen or if the  condition fails to improve as anticipated.    Dudley Major, DO

## 2019-07-21 ENCOUNTER — Other Ambulatory Visit: Payer: Self-pay

## 2019-07-21 ENCOUNTER — Telehealth (INDEPENDENT_AMBULATORY_CARE_PROVIDER_SITE_OTHER): Payer: BC Managed Care – PPO | Admitting: Neurology

## 2019-07-21 ENCOUNTER — Encounter: Payer: Self-pay | Admitting: Neurology

## 2019-07-21 VITALS — Ht 66.0 in | Wt 177.0 lb

## 2019-07-21 DIAGNOSIS — G43009 Migraine without aura, not intractable, without status migrainosus: Secondary | ICD-10-CM

## 2019-07-21 MED ORDER — TOPIRAMATE 50 MG PO TABS: 100.0000 mg | ORAL_TABLET | Freq: Every day | ORAL | 3 refills | Status: DC

## 2019-07-21 NOTE — Patient Instructions (Signed)
Increase topiramate to 100mg  at bedtime.  I prescribed you 50mg  tablet.  If you feel it is too strong, may reduce dose to 1 1/2 tablets at bedtime.

## 2019-07-29 ENCOUNTER — Other Ambulatory Visit: Payer: Self-pay | Admitting: Family Medicine

## 2019-08-11 DIAGNOSIS — Z20828 Contact with and (suspected) exposure to other viral communicable diseases: Secondary | ICD-10-CM | POA: Diagnosis not present

## 2019-08-31 NOTE — Progress Notes (Signed)
Sent request for authorization to cover my meds key [id number] .Awaiting approval

## 2019-09-09 ENCOUNTER — Other Ambulatory Visit: Payer: Self-pay | Admitting: Family Medicine

## 2019-09-10 ENCOUNTER — Encounter: Payer: Self-pay | Admitting: Family Medicine

## 2019-09-18 ENCOUNTER — Other Ambulatory Visit: Payer: Self-pay

## 2019-09-18 ENCOUNTER — Ambulatory Visit (INDEPENDENT_AMBULATORY_CARE_PROVIDER_SITE_OTHER): Payer: BC Managed Care – PPO | Admitting: Family Medicine

## 2019-09-18 ENCOUNTER — Encounter: Payer: Self-pay | Admitting: Family Medicine

## 2019-09-18 VITALS — BP 127/86 | HR 89 | Ht 66.0 in | Wt 192.0 lb

## 2019-09-18 DIAGNOSIS — E669 Obesity, unspecified: Secondary | ICD-10-CM | POA: Diagnosis not present

## 2019-09-18 DIAGNOSIS — F419 Anxiety disorder, unspecified: Secondary | ICD-10-CM | POA: Diagnosis not present

## 2019-09-18 DIAGNOSIS — F329 Major depressive disorder, single episode, unspecified: Secondary | ICD-10-CM | POA: Diagnosis not present

## 2019-09-18 MED ORDER — PHENTERMINE HCL 37.5 MG PO CAPS: 37.5000 mg | ORAL_CAPSULE | ORAL | 2 refills | Status: DC

## 2019-09-18 NOTE — Progress Notes (Signed)
I have discussed the procedure for the virtual visit with the patient who has given consent to proceed with assessment and treatment.   Emma Wagner, CMA     

## 2019-09-18 NOTE — Assessment & Plan Note (Signed)
Deteriorated.  Pt gained 13 lbs due to stress eating.  Has tolerated Phentermine in the past w/o difficulty.  Will restart and monitor closely.  Stressed need for healthy diet and regular exercise in addition to medication.  Pt expressed understanding and is in agreement w/ plan.

## 2019-09-18 NOTE — Assessment & Plan Note (Signed)
Deteriorated w/ loss of her father.  Following w/ psychiatrist.  Considering grief counseling and restarting therapy.  On meds.  Denies SI/HI.  Will follow.

## 2019-09-18 NOTE — Progress Notes (Signed)
Virtual Visit via Video   I connected with patient on 09/18/19 at 11:30 AM EST by a video enabled telemedicine application and verified that I am speaking with the correct person using two identifiers.  Location patient: Home Location provider: Acupuncturist, Office Persons participating in the virtual visit: Patient, Provider, [place] (Jess B)  I discussed the limitations of evaluation and management by telemedicine and the availability of in person appointments. The patient expressed understanding and agreed to proceed.  Subjective:   HPI:   Obesity- father died of COVID in 09/11/23.  She also had COVID but has recovered w/ exception of smell and taste.  She has been under considerable stress and reports stress eating.  Mother and sister aren't talking to her, she is searching for a job.  Pt is aware that she needs to get back on track but is really struggling emotionally.  Has regained 13 lbs since Christmas.  Pt wants to restart Phentermine- doesn't feel it worsened her anxiety.  Pt is considering grief counseling.  ROS:   See pertinent positives and negatives per HPI.  Patient Active Problem List   Diagnosis Date Noted  . Chronic constipation 03/04/2019  . Migraines 11/21/2018  . Postoperative anemia due to acute blood loss 06/16/2016  . Primary osteoarthritis of left hip 10/31/2015  . Primary osteoarthritis of right hip 10/30/2015  . Pain of right upper arm 05/12/2015  . Hair loss 05/02/2012  . Prediabetes 02/20/2012  . Thrombosed external hemorrhoid 02/08/2012  . Post-nasal drip 11/27/2011  . Anxiety and depression 11/27/2011  . Arthritis of right hip 11/27/2011  . General medical examination 10/29/2011  . Cough 09/17/2011  . Obesity (BMI 30-39.9) 09/17/2011  . Tobacco abuse 08/03/2011  . X-ray of lung, abnormal 06/11/2007  . GERD 06/11/2007    Social History   Tobacco Use  . Smoking status: Former Smoker    Packs/day: 1.00    Years: 26.00    Pack years:  26.00    Quit date: 07/20/2012    Years since quitting: 7.1  . Smokeless tobacco: Never Used  Substance Use Topics  . Alcohol use: Yes    Comment: occasional    Current Outpatient Medications:  .  buPROPion (WELLBUTRIN XL) 150 MG 24 hr tablet, Take 150 mg by mouth every morning., Disp: , Rfl: 12 .  buPROPion (WELLBUTRIN XL) 300 MG 24 hr tablet, Take 300 mg by mouth daily with breakfast., Disp: , Rfl: 12 .  cyanocobalamin 1000 MCG tablet, Take 1,000 mcg by mouth daily., Disp: , Rfl:  .  diphenhydramine-acetaminophen (TYLENOL PM) 25-500 MG TABS tablet, Take 1-2 tablets by mouth at bedtime., Disp: , Rfl:  .  FLUoxetine (PROZAC) 40 MG capsule, Take 40 mg by mouth daily with breakfast., Disp: , Rfl: 12 .  ibuprofen (ADVIL,MOTRIN) 800 MG tablet, ibuprofen 800 mg tablet  TAKE 1 TABLET 3 TIMES A DAY AS NEEDED FOR PAIN, Disp: , Rfl:  .  omeprazole (PRILOSEC) 40 MG capsule, TAKE 1 CAPSULE EVERY DAY, Disp: 90 capsule, Rfl: 0 .  ondansetron (ZOFRAN-ODT) 4 MG disintegrating tablet, TAKE 1 TABLET BY MOUTH EVERY 8 HOURS AS NEEDED FOR NAUSEA AND VOMITING, Disp: 20 tablet, Rfl: 0 .  pantoprazole (PROTONIX) 40 MG tablet, TAKE 1 TABLET BY MOUTH TWICE A DAY, Disp: 180 tablet, Rfl: 1 .  SUMAtriptan (IMITREX) 50 MG tablet, Take 50 mg by mouth every 2 (two) hours as needed for migraine. May repeat in 2 hours if headache persists or recurs., Disp: , Rfl:  .  topiramate (TOPAMAX) 50 MG tablet, Take 2 tablets (100 mg total) by mouth at bedtime., Disp: 60 tablet, Rfl: 3 .  Ubrogepant (UBRELVY) 100 MG TABS, Take 1 tablet by mouth as needed (May repeat dose in 2 hours if needed.  Maximum 2 tablets in 24 hours)., Disp: 16 tablet, Rfl: 3 .  phentermine 37.5 MG capsule, Take 1 capsule (37.5 mg total) by mouth every morning. (Patient not taking: Reported on 09/18/2019), Disp: 30 capsule, Rfl: 2  No Known Allergies  Objective:   BP 127/86   Pulse 89   Ht 5\' 6"  (1.676 m)   Wt 192 lb (87.1 kg)   BMI 30.99 kg/m   AAOx3,  NAD Obese NCAT, EOMI No obvious CN deficits Coloring WNL Pt is able to speak clearly, coherently without shortness of breath or increased work of breathing.  Thought process is linear.  Mood is appropriate.   Assessment and Plan:   Obesity- see problem based charting   Annye Asa, MD 09/18/2019

## 2019-10-14 ENCOUNTER — Other Ambulatory Visit: Payer: Self-pay | Admitting: Neurology

## 2019-10-14 ENCOUNTER — Encounter: Payer: Self-pay | Admitting: *Deleted

## 2019-10-14 NOTE — Progress Notes (Signed)
Vernetta Honey (Key: [id number]) Rx #: [id number] Roselyn Meier 100MG  tablets   Form [employer] [place] [employer] Electronic Request Form (CB)  Patient Copay Assistance  Created 2 months ago Sent to Plan 1 month ago Plan Response 1 month ago Submit Clinical Questions 1 month ago Determination Unfavorable 1 month ago

## 2019-10-14 NOTE — Progress Notes (Addendum)
Emma Wagner (Key: [id number]) Rx #[id number] Emma Wagner 100MG  tablets   Form [employer] [place] [employer] Electronic Request Form (CB)  Patient Copay Assistance  Created 1 day ago Sent to Plan 1 minute ago Plan Response 1 minute ago Submit Clinical Questions less than a minute ago Determination Wait for Determination Please wait for [employer] [place] [employer] cb central to return a determination.  Emma Wagner ([id number])  This request has received a Cancelled outcome.  This may mean either your patient does not have active coverage with this plan, this authorization was processed as a duplicate request, or an authorization was not needed for this medication.  Note any additional information provided by [facility] at the bottom of this request, and contact [employer] [place] directly for further details.  Dup/PCR to Key # [id number]

## 2019-10-19 NOTE — Progress Notes (Signed)
Covermymeds Key [id number] request for Emma Wagner was denied and appealed. The additional review for services has been reviewed and approved by [facility]  Approval valid 08/31/2019-08/29/2020

## 2019-10-20 ENCOUNTER — Other Ambulatory Visit: Payer: Self-pay

## 2019-10-20 ENCOUNTER — Ambulatory Visit (INDEPENDENT_AMBULATORY_CARE_PROVIDER_SITE_OTHER): Payer: BC Managed Care – PPO | Admitting: Family Medicine

## 2019-10-20 ENCOUNTER — Encounter: Payer: Self-pay | Admitting: Family Medicine

## 2019-10-20 VITALS — BP 113/77 | HR 88 | Temp 97.1°F | Ht 66.0 in | Wt 189.0 lb

## 2019-10-20 DIAGNOSIS — F419 Anxiety disorder, unspecified: Secondary | ICD-10-CM | POA: Diagnosis not present

## 2019-10-20 DIAGNOSIS — E669 Obesity, unspecified: Secondary | ICD-10-CM | POA: Diagnosis not present

## 2019-10-20 DIAGNOSIS — F329 Major depressive disorder, single episode, unspecified: Secondary | ICD-10-CM

## 2019-10-20 NOTE — Progress Notes (Signed)
I have discussed the procedure for the virtual visit with the patient who has given consent to proceed with assessment and treatment.   Emma Wagner L Daejah Klebba, CMA     

## 2019-10-20 NOTE — Progress Notes (Signed)
Virtual Visit via Video   I connected with patient on 10/20/19 at 11:30 AM EST by a video enabled telemedicine application and verified that I am speaking with the correct person using two identifiers.  Location patient: Home Location provider: Acupuncturist, Office Persons participating in the virtual visit: Patient, Provider, [place] (Jess B)  I discussed the limitations of evaluation and management by telemedicine and the availability of in person appointments. The patient expressed understanding and agreed to proceed.  Subjective:   HPI:   Obesity- started phentermine at last visit.  Reports 'head is starting to get in the game'.  Reports husband has restarted working out and he is being very supportive regarding her weight loss.  Grief- father died in 2022/09/29 and she is having a difficult time.  Has talked to a few people at church and plans to start grief counseling.  Has moved her dad's urn so she is not seeing it daily.  ROS:   See pertinent positives and negatives per HPI.  Patient Active Problem List   Diagnosis Date Noted  . Chronic constipation 03/04/2019  . Migraines 11/21/2018  . Postoperative anemia due to acute blood loss 06/16/2016  . Primary osteoarthritis of left hip 10/31/2015  . Primary osteoarthritis of right hip 10/30/2015  . Pain of right upper arm 05/12/2015  . Hair loss 05/02/2012  . Prediabetes 02/20/2012  . Thrombosed external hemorrhoid 02/08/2012  . Post-nasal drip 11/27/2011  . Anxiety and depression 11/27/2011  . Arthritis of right hip 11/27/2011  . General medical examination 10/29/2011  . Cough 09/17/2011  . Obesity (BMI 30-39.9) 09/17/2011  . Tobacco abuse 08/03/2011  . X-ray of lung, abnormal 06/11/2007  . GERD 06/11/2007    Social History   Tobacco Use  . Smoking status: Former Smoker    Packs/day: 1.00    Years: 26.00    Pack years: 26.00    Quit date: 07/20/2012    Years since quitting: 7.2  . Smokeless tobacco: Never  Used  Substance Use Topics  . Alcohol use: Yes    Comment: occasional    Current Outpatient Medications:  .  buPROPion (WELLBUTRIN XL) 150 MG 24 hr tablet, Take 150 mg by mouth every morning., Disp: , Rfl: 12 .  buPROPion (WELLBUTRIN XL) 300 MG 24 hr tablet, Take 300 mg by mouth daily with breakfast., Disp: , Rfl: 12 .  cyanocobalamin 1000 MCG tablet, Take 1,000 mcg by mouth daily., Disp: , Rfl:  .  diphenhydramine-acetaminophen (TYLENOL PM) 25-500 MG TABS tablet, Take 1-2 tablets by mouth at bedtime., Disp: , Rfl:  .  FLUoxetine (PROZAC) 40 MG capsule, Take 40 mg by mouth daily with breakfast., Disp: , Rfl: 12 .  ibuprofen (ADVIL,MOTRIN) 800 MG tablet, ibuprofen 800 mg tablet  TAKE 1 TABLET 3 TIMES A DAY AS NEEDED FOR PAIN, Disp: , Rfl:  .  omeprazole (PRILOSEC) 40 MG capsule, TAKE 1 CAPSULE EVERY DAY, Disp: 90 capsule, Rfl: 0 .  ondansetron (ZOFRAN-ODT) 4 MG disintegrating tablet, TAKE 1 TABLET BY MOUTH EVERY 8 HOURS AS NEEDED FOR NAUSEA AND VOMITING, Disp: 20 tablet, Rfl: 0 .  pantoprazole (PROTONIX) 40 MG tablet, TAKE 1 TABLET BY MOUTH TWICE A DAY, Disp: 180 tablet, Rfl: 1 .  phentermine 37.5 MG capsule, Take 1 capsule (37.5 mg total) by mouth every morning., Disp: 30 capsule, Rfl: 2 .  SUMAtriptan (IMITREX) 50 MG tablet, Take 50 mg by mouth every 2 (two) hours as needed for migraine. May repeat in 2 hours if  headache persists or recurs., Disp: , Rfl:  .  topiramate (TOPAMAX) 50 MG tablet, TAKE 2 TABLETS (100 MG TOTAL) BY MOUTH AT BEDTIME., Disp: 180 tablet, Rfl: 1 .  Ubrogepant (UBRELVY) 100 MG TABS, Take 1 tablet by mouth as needed (May repeat dose in 2 hours if needed.  Maximum 2 tablets in 24 hours)., Disp: 16 tablet, Rfl: 3  No Known Allergies  Objective:   BP 113/77   Pulse 88   Temp (!) 97.1 F (36.2 C) (Tympanic)   Ht 5\' 6"  (1.676 m)   Wt 189 lb (85.7 kg)   BMI 30.51 kg/m   AAOx3, NAD NCAT, EOMI No obvious CN deficits Coloring WNL Pt is able to speak clearly,  coherently without shortness of breath or increased work of breathing.  Thought process is linear.  Mood is appropriate.   Assessment and Plan:   Obesity- pt reports she is feeling well on Phentermine.  No worsening of anxiety, no palpitations, no insomnia.  She admits that she has just recently started thinking about diet and exercise.  Husband is supportive.  Will continue to follow.  Anxiety and Depression- pt is still working w/ psychiatrist and is going to start grief counseling.  Applauded her decision.  Will follow along.   Annye Asa, MD 10/20/2019

## 2019-10-31 ENCOUNTER — Other Ambulatory Visit: Payer: Self-pay | Admitting: Family Medicine

## 2019-11-07 ENCOUNTER — Other Ambulatory Visit: Payer: Self-pay | Admitting: Neurology

## 2019-11-23 DIAGNOSIS — F900 Attention-deficit hyperactivity disorder, predominantly inattentive type: Secondary | ICD-10-CM | POA: Diagnosis not present

## 2019-11-23 DIAGNOSIS — F4321 Adjustment disorder with depressed mood: Secondary | ICD-10-CM | POA: Diagnosis not present

## 2019-11-23 DIAGNOSIS — F3342 Major depressive disorder, recurrent, in full remission: Secondary | ICD-10-CM | POA: Diagnosis not present

## 2019-11-23 DIAGNOSIS — F410 Panic disorder [episodic paroxysmal anxiety] without agoraphobia: Secondary | ICD-10-CM | POA: Diagnosis not present

## 2019-11-30 NOTE — Progress Notes (Signed)
Virtual Visit via Video Note The purpose of this virtual visit is to provide medical care while limiting exposure to the novel coronavirus.    Consent was obtained for video visit:  Yes.   Answered questions that patient had about telehealth interaction:  Yes.   I discussed the limitations, risks, security and privacy concerns of performing an evaluation and management service by telemedicine. I also discussed with the patient that there may be a patient responsible charge related to this service. The patient expressed understanding and agreed to proceed.  Pt location: Home Physician Location: office Name of referring provider:  Midge Minium, MD I connected with Emma Wagner at patients initiation/request on 12/02/2019 at 10:10 AM EDT by video enabled telemedicine application and verified that I am speaking with the correct person using two identifiers. Pt MRN:  [id number] Pt DOB:  [birth year] Video Participants:  Emma Wagner   History of Present Illness:  Emma Wagner is a 49 year old womanwith history of kidney stoneswho follows up for migraines.  UPDATE: Topiramate was increased in December.  No change in headaches.  Wakes up in the morning with headache.  Rescue therapy:  Ubrely, ibuprofen 800mg  second time Ibuprofen 800mg  twice a month Ubrely 4 days a month  Intensity:  8/10 Duration:  Usually 1 hour, rarely 2 hours. Frequency:  10-12 headaches in past 30 days (4 are what she considers migraine) Current NSAIDS:ibuprofen 800mg  Current analgesics:none Current triptans:none Current ergotamine:none Current anti-emetic:Zofran-ODT 4mg  Current muscle relaxants:none Current anti-anxiolytic:none Current sleep aide:None Current Antihypertensive medications:none Current Antidepressant medications:Wellbutrin, Prozac 40mg  Current Anticonvulsant medications:topiramate 100mg  at bedtime Current anti-CGRP:Ubrelvy 100mg  Current  Vitamins/Herbal/Supplements:none Current Antihistamines/Decongestants:Tylenol PM Other therapy:Meditation, Daith piercing in both ears (helped) Hormone/birth control:none Other medications:Adderall  Caffeine:1 cup of coffee daily Diet:Improved diet to lose weight. Increased water intake. No soda. Exercise:yes Depression:controlled; Anxiety:controlled Other pain:History of neck and back pain. Sleep hygiene:Difficult to fall asleep. With headache, she may wake up during the night.  HISTORY: Onset:. Increased frequency over the years. She wakes up with some sort of headache daily. Location:Either left or right sided (including periorbital area) Quality:Sometimes throbbing and sometimes non-throbbing Initial intensity:8/10.Shedenies new headache, thunderclap headache or severe headache that wakes herfrom sleep. Aura:no Premonitory Phase:no Postdrome:Fatigued, exhausted for a day Associated symptoms:Nausea, vomiting, photophobia, phonophobia, osmophobia,.Shedenies associated visual disturbance, autonomic symptoms, orunilateral numbness or weakness. Initial Duration:2 to 3 days Initial Frequency:Daily headaches; 2 to 3 days of migraine a week Triggers:  Unknown Relieving factors:Sleep in dark and quiet room, meditation Activity:Aggravates She wakes up every morning with some type of diffuse dull headache. She treats these headaches every morning with either rizatriptan, Fioricet (for about a year)  Past NSAIDS:ASA, Celebrex, ibuprofen Past analgesics:Tylenol, Excedrin Migraine, Fioricet Past abortive triptans:Rizatriptan 10mg ,Sumatriptan 50mg (previously effective) Past abortive ergotamine:none Past muscle relaxants:Flexeril, Robaxin Past anti-emetic:none Past antihypertensive medications:None. However, she reports history of dizziness. Past antidepressant medications:Venlafaxine Past anticonvulsant  medications:gabapentin Past anti-CGRP:none Past vitamins/Herbal/Supplements:none Past antihistamines/decongestants:Flonase, Allegra Other past therapies:none   Family history of headache:Mother  MRI of cervical spine from 06/19/18 personally reviewed and showed broad-based posterior disc osteophyte complex at C5-6 causing mild spinal stenosis and moderate to severe right and mild-moderate left neural foraminal stenosis.  Past Medical History: Past Medical History:  Diagnosis Date  . Anxiety    add also  . Arthritis   . Borderline type 2 diabetes mellitus since 2013   -lost weight and quit smoking and is no longer PRE diabetic  . Bursitis    bilateral  hips, more on right hip  . Chronic kidney disease    h/o kidney stone 2016  . Constipation   . Depression   . Frequent urination    while kidney stone evident  . GERD (gastroesophageal reflux disease)   . Headache    "every blue moon. not often"  . Hemorrhoids   . History of kidney stones   . Pneumonia   . PONV (postoperative nausea and vomiting)    nausea with appendectomy and with hip replacement in March 2017    Medications: Outpatient Encounter Medications as of 12/02/2019  Medication Sig  . buPROPion (WELLBUTRIN XL) 150 MG 24 hr tablet Take 150 mg by mouth every morning.  Marland Kitchen buPROPion (WELLBUTRIN XL) 300 MG 24 hr tablet Take 300 mg by mouth daily with breakfast.  . cyanocobalamin 1000 MCG tablet Take 1,000 mcg by mouth daily.  . diphenhydramine-acetaminophen (TYLENOL PM) 25-500 MG TABS tablet Take 1-2 tablets by mouth at bedtime.  Marland Kitchen FLUoxetine (PROZAC) 40 MG capsule Take 40 mg by mouth daily with breakfast.  . ibuprofen (ADVIL,MOTRIN) 800 MG tablet ibuprofen 800 mg tablet  TAKE 1 TABLET 3 TIMES A DAY AS NEEDED FOR PAIN  . omeprazole (PRILOSEC) 40 MG capsule TAKE 1 CAPSULE EVERY DAY  . ondansetron (ZOFRAN-ODT) 4 MG disintegrating tablet TAKE 1 TABLET BY MOUTH EVERY 8 HOURS AS NEEDED FOR NAUSEA AND VOMITING   . pantoprazole (PROTONIX) 40 MG tablet TAKE 1 TABLET BY MOUTH TWICE A DAY  . phentermine 37.5 MG capsule Take 1 capsule (37.5 mg total) by mouth every morning.  . SUMAtriptan (IMITREX) 50 MG tablet Take 50 mg by mouth every 2 (two) hours as needed for migraine. May repeat in 2 hours if headache persists or recurs.  . topiramate (TOPAMAX) 25 MG tablet TAKE 3 TABLETS AT BEDTIME  . topiramate (TOPAMAX) 50 MG tablet TAKE 2 TABLETS (100 MG TOTAL) BY MOUTH AT BEDTIME.  Marland Kitchen Ubrogepant (UBRELVY) 100 MG TABS Take 1 tablet by mouth as needed (May repeat dose in 2 hours if needed.  Maximum 2 tablets in 24 hours).   No facility-administered encounter medications on file as of 12/02/2019.    Allergies: No Known Allergies  Family History: Family History  Problem Relation Age of Onset  . Heart failure Other   . Hypertension Other   . Cancer Other   . Anxiety disorder Other   . Depression Other   . Arthritis Mother   . Hypertension Mother   . Prostate cancer Father   . Skin cancer Father   . Congestive Heart Failure Father   . Hypertension Father     Social History: Social History   Socioeconomic History  . Marital status: Single    Spouse name: Not on file  . Number of children: 2  . Years of education: 28  . Highest education level: 12th grade  Occupational History  . Occupation: unemployed  Tobacco Use  . Smoking status: Former Smoker    Packs/day: 1.00    Years: 26.00    Pack years: 26.00    Quit date: 07/20/2012    Years since quitting: 7.3  . Smokeless tobacco: Never Used  Substance and Sexual Activity  . Alcohol use: Yes    Comment: occasional  . Drug use: No  . Sexual activity: Yes    Birth control/protection: None  Other Topics Concern  . Not on file  Social History Narrative   Patient is right-handed. She lives in a 2 level home. Drinks one cup  of coffee a day. She walks daily.   Social Determinants of Health   Financial Resource Strain:   . Difficulty of Paying  Living Expenses:   Food Insecurity:   . Worried About Charity fundraiser in the Last Year:   . Arboriculturist in the Last Year:   Transportation Needs:   . Film/video editor (Medical):   Marland Kitchen Lack of Transportation (Non-Medical):   Physical Activity:   . Days of Exercise per Week:   . Minutes of Exercise per Session:   Stress:   . Feeling of Stress :   Social Connections:   . Frequency of Communication with Friends and Family:   . Frequency of Social Gatherings with Friends and Family:   . Attends Religious Services:   . Active Member of Clubs or Organizations:   . Attends Archivist Meetings:   Marland Kitchen Marital Status:   Intimate Partner Violence:   . Fear of Current or Ex-Partner:   . Emotionally Abused:   Marland Kitchen Physically Abused:   . Sexually Abused:     Observations/Objective:   There were no vitals taken for this visit. No acute distress.  Alert and oriented.  Speech fluent and not dysarthric.  Language intact.  Eyes orthophoric on primary gaze.  Face symmetric.  Assessment and Plan:   Migraine without aura, without status migrainosus, not intractable  1.  For preventative management, taper off topiramate (50mg  daily for one week, then stop) and will start Aimovig 140mg  every 28 days 2.  For abortive therapy, Ubrelvy 100mg  3.  Limit use of pain relievers to no more than 2 days out of week to prevent risk of rebound or medication-overuse headache. 4.  Keep headache diary 5.  Exercise, hydration, caffeine cessation, sleep hygiene, monitor for and avoid triggers 6. Follow up 4 months   Follow Up Instructions:    -I discussed the assessment and treatment plan with the patient. The patient was provided an opportunity to ask questions and all were answered. The patient agreed with the plan and demonstrated an understanding of the instructions.   The patient was advised to call back or seek an in-person evaluation if the symptoms worsen or if the condition fails to  improve as anticipated.    Dudley Major, DO

## 2019-12-02 ENCOUNTER — Telehealth (INDEPENDENT_AMBULATORY_CARE_PROVIDER_SITE_OTHER): Payer: BC Managed Care – PPO | Admitting: Neurology

## 2019-12-02 ENCOUNTER — Other Ambulatory Visit: Payer: Self-pay

## 2019-12-02 DIAGNOSIS — G43709 Chronic migraine without aura, not intractable, without status migrainosus: Secondary | ICD-10-CM | POA: Diagnosis not present

## 2019-12-02 MED ORDER — AIMOVIG 140 MG/ML ~~LOC~~ SOAJ: 140.0000 mg | SUBCUTANEOUS | 11 refills | Status: DC

## 2019-12-02 NOTE — Patient Instructions (Signed)
Migraine Recommendations: 1.  Start Aimovig 140mg  injection every 28 days.  We will stop topiramate.  Take 50mg  at bedtime for week then stop 2.  Take Ubrelvy as needed. 3.  Limit use of pain relievers to no more than 2 days out of the week.  These medications include acetaminophen, ibuprofen, triptans and narcotics.  This will help reduce risk of rebound headaches. 4.  Be aware of common food triggers such as processed sweets, processed foods with nitrites (such as deli meat, hot dogs, sausages), foods with MSG, alcohol (such as wine), chocolate, certain cheeses, certain fruits (dried fruits, bananas, pineapple), vinegar, diet soda. 4.  Avoid caffeine 5.  Routine exercise 6.  Proper sleep hygiene 7.  Stay adequately hydrated with water 8.  Keep a headache diary. 9.  Maintain proper stress management. 10.  Do not skip meals. 11.  Consider supplements:  Magnesium citrate 400mg  to 600mg  daily, riboflavin 400mg , Coenzyme Q 10 100mg  three times daily   Migraine Headache A migraine headache is a very strong throbbing pain on one side or both sides of your head. This type of headache can also cause other symptoms. It can last from 4 hours to 3 days. Talk with your doctor about what things may bring on (trigger) this condition. What are the causes? The exact cause of this condition is not known. This condition may be triggered or caused by:  Drinking alcohol.  Smoking.  Taking medicines, such as: ? Medicine used to treat chest pain (nitroglycerin). ? Birth control pills. ? Estrogen. ? Some blood pressure medicines.  Eating or drinking certain products.  Doing physical activity. Other things that may trigger a migraine headache include:  Having a menstrual period.  Pregnancy.  Hunger.  Stress.  Not getting enough sleep or getting too much sleep.  Weather changes.  Tiredness (fatigue). What increases the risk?  Being 86-99 years old.  Being female.  Having a family  history of migraine headaches.  Being Caucasian.  Having depression or anxiety.  Being very overweight. What are the signs or symptoms?  A throbbing pain. This pain may: ? Happen in any area of the head, such as on one side or both sides. ? Make it hard to do daily activities. ? Get worse with physical activity. ? Get worse around bright lights or loud noises.  Other symptoms may include: ? Feeling sick to your stomach (nauseous). ? Vomiting. ? Dizziness. ? Being sensitive to bright lights, loud noises, or smells.  Before you get a migraine headache, you may get warning signs (an aura). An aura may include: ? Seeing flashing lights or having blind spots. ? Seeing bright spots, halos, or zigzag lines. ? Having tunnel vision or blurred vision. ? Having numbness or a tingling feeling. ? Having trouble talking. ? Having weak muscles.  Some people have symptoms after a migraine headache (postdromal phase), such as: ? Tiredness. ? Trouble thinking (concentrating). How is this treated?  Taking medicines that: ? Relieve pain. ? Relieve the feeling of being sick to your stomach. ? Prevent migraine headaches.  Treatment may also include: ? Having acupuncture. ? Avoiding foods that bring on migraine headaches. ? Learning ways to control your body functions (biofeedback). ? Therapy to help you know and deal with negative thoughts (cognitive behavioral therapy). Follow these instructions at home: Medicines  Take over-the-counter and prescription medicines only as told by your doctor.  Ask your doctor if the medicine prescribed to you: ? Requires you to avoid driving or  using heavy machinery. ? Can cause trouble pooping (constipation). You may need to take these steps to prevent or treat trouble pooping:  Drink enough fluid to keep your pee (urine) pale yellow.  Take over-the-counter or prescription medicines.  Eat foods that are high in fiber. These include beans, whole  grains, and fresh fruits and vegetables.  Limit foods that are high in fat and sugar. These include fried or sweet foods. Lifestyle  Do not drink alcohol.  Do not use any products that contain nicotine or tobacco, such as cigarettes, e-cigarettes, and chewing tobacco. If you need help quitting, ask your doctor.  Get at least 8 hours of sleep every night.  Limit and deal with stress. General instructions      Keep a journal to find out what may bring on your migraine headaches. For example, write down: ? What you eat and drink. ? How much sleep you get. ? Any change in what you eat or drink. ? Any change in your medicines.  If you have a migraine headache: ? Avoid things that make your symptoms worse, such as bright lights. ? It may help to lie down in a dark, quiet room. ? Do not drive or use heavy machinery. ? Ask your doctor what activities are safe for you.  Keep all follow-up visits as told by your doctor. This is important. Contact a doctor if:  You get a migraine headache that is different or worse than others you have had.  You have more than 15 headache days in one month. Get help right away if:  Your migraine headache gets very bad.  Your migraine headache lasts longer than 72 hours.  You have a fever.  You have a stiff neck.  You have trouble seeing.  Your muscles feel weak or like you cannot control them.  You start to lose your balance a lot.  You start to have trouble walking.  You pass out (faint).  You have a seizure. Summary  A migraine headache is a very strong throbbing pain on one side or both sides of your head. These headaches can also cause other symptoms.  This condition may be treated with medicines and changes to your lifestyle.  Keep a journal to find out what may bring on your migraine headaches.  Contact a doctor if you get a migraine headache that is different or worse than others you have had.  Contact your doctor if you  have more than 15 headache days in a month. This information is not intended to replace advice given to you by your health care provider. Make sure you discuss any questions you have with your health care provider. Document Revised: 11/28/2018 Document Reviewed: 09/18/2018 Elsevier Patient Education  [place].

## 2019-12-02 NOTE — Progress Notes (Signed)
TAMETHA NOWAK ([employer]) [id number] Aimovig 140MG /ML auto-injectors Status: PA Response - Approved

## 2019-12-04 ENCOUNTER — Other Ambulatory Visit: Payer: Self-pay | Admitting: Neurology

## 2019-12-18 DIAGNOSIS — R519 Headache, unspecified: Secondary | ICD-10-CM | POA: Diagnosis not present

## 2019-12-18 DIAGNOSIS — R5383 Other fatigue: Secondary | ICD-10-CM | POA: Diagnosis not present

## 2020-02-15 ENCOUNTER — Encounter (INDEPENDENT_AMBULATORY_CARE_PROVIDER_SITE_OTHER): Payer: BC Managed Care – PPO | Admitting: Family Medicine

## 2020-02-15 DIAGNOSIS — N3000 Acute cystitis without hematuria: Secondary | ICD-10-CM

## 2020-02-15 MED ORDER — CEPHALEXIN 500 MG PO CAPS: 500.0000 mg | ORAL_CAPSULE | Freq: Two times a day (BID) | ORAL | 0 refills | Status: AC

## 2020-02-15 NOTE — Telephone Encounter (Signed)
Cumulative Time: 5 minutes Consent: Pt reached out via MyChart, gave consent to proceed People Involved: Pt, CMA (Jess B), myself CC: 'I think I have a UTI' HPI: sxs started last night w/ frequency, urgency, hesitancy, suprapubic pressure.  Pt has hx of UTIs.  NKDA. AP: UTI.  Start Keflex.

## 2020-02-24 ENCOUNTER — Other Ambulatory Visit: Payer: Self-pay | Admitting: Family Medicine

## 2020-03-14 ENCOUNTER — Encounter: Payer: Self-pay | Admitting: Family Medicine

## 2020-03-28 ENCOUNTER — Encounter: Payer: Self-pay | Admitting: Neurology

## 2020-03-28 NOTE — Progress Notes (Signed)
Emma Wagner (Key: [id number]) Rx #: [id number] Aimovig 140MG /ML auto-injectors   Form [employer] [place] Commercial Electronic Request Form (CB) Created 1 day ago Sent to Plan 2 minutes ago Plan Response 2 minutes ago Submit Clinical Questions less than a minute ago Determination Favorable less than a minute ago Message from Plan Effective from 03/28/2020 through 06/25/2020.

## 2020-04-10 ENCOUNTER — Other Ambulatory Visit: Payer: Self-pay | Admitting: Neurology

## 2020-04-14 NOTE — Progress Notes (Signed)
NEUROLOGY FOLLOW UP OFFICE NOTE  Emma Wagner [id number]  HISTORY OF PRESENT ILLNESS: Emma Wagner is a 49 year old womanwith history of kidney stoneswhofollows up for migraines.  UPDATE: In April, she was tapered off of topiramate and started on Aimovig.  Rescue therapy:  Ubrely x2, then ibuprofen 800mg     Intensity:8/10 Duration:Usually 2 to 4 hours. Frequency:10 headaches in past 30 days (2 are what she considers migraine) Current NSAIDS:ibuprofen 800mg  Current analgesics:Tylenol PM Current triptans:none Current ergotamine:none Current anti-emetic:Zofran-ODT 4mg  Current muscle relaxants:none Current anti-anxiolytic:none Current sleep aide:Tylenol PM Current Antihypertensive medications:none Current Antidepressant medications:Wellbutrin, Prozac 40mg  Current Anticonvulsant medications:none Current anti-CGRP:Ubrelvy 100mg , Aimovig 140mg  Current Vitamins/Herbal/Supplements:B12 Current Antihistamines/Decongestants:Tylenol PM Other therapy:Meditation, Daith piercing in both ears (helped) Hormone/birth control:none Other medications:Adderall  Caffeine:1 cup of coffee daily Diet:Improved diet to lose weight. Increased water intake. No soda. Exercise:yes Depression:controlled; Anxiety:controlled Other pain:History of neck and back pain. Sleep hygiene:Difficult to fall asleep. With headache, she may wake up during the night.  HISTORY: Onset:. Increased frequency over the years. She wakes up with some sort of headache daily. Location:Either left or right sided (including periorbital area) Quality:Sometimes throbbing and sometimes non-throbbing Initial intensity:8/10.Shedenies new headache, thunderclap headache or severe headache that wakes herfrom sleep. Aura:no Premonitory Phase:no Postdrome:Fatigued, exhausted for a day Associated symptoms:Nausea, vomiting, photophobia, phonophobia,  osmophobia,.Shedenies associated visual disturbance, autonomic symptoms, orunilateral numbness or weakness. InitialDuration:2 to 3 days InitialFrequency:Daily headaches;2 to 3 daysof migrainea week Triggers: Unknown Relieving factors:Sleep in dark and quiet room, meditation Activity:Aggravates She wakes up every morning with some type of diffuse dull headache. She treats these headaches every morning with either rizatriptan, Fioricet (for about a year)  Past NSAIDS:ASA, Celebrex, ibuprofen Past analgesics:Tylenol, Excedrin Migraine, Fioricet Past abortive triptans:Rizatriptan 10mg ,Sumatriptan 50mg (previously effective) Past abortive ergotamine:none Past muscle relaxants:Flexeril, Robaxin Past anti-emetic:none Past antihypertensive medications:None. However, she reports history of dizziness. 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  PAST MEDICAL HISTORY: Past Medical History:  Diagnosis Date  . Anxiety    add also  . Arthritis   . Borderline type 2 diabetes mellitus since 2013   -lost weight and quit smoking and is no longer PRE diabetic  . Bursitis    bilateral hips, more on right hip  . Chronic kidney disease    h/o kidney stone 2016  . Constipation   . Depression   . Frequent urination    while kidney stone evident  . GERD (gastroesophageal reflux disease)   . Headache    "every blue moon. not often"  . Hemorrhoids   . History of kidney stones   . Pneumonia    . PONV (postoperative nausea and vomiting)    nausea with appendectomy and with hip replacement in March 2017    MEDICATIONS: Current Outpatient Medications on File Prior to Visit  Medication Sig Dispense Refill  . buPROPion (WELLBUTRIN XL) 150 MG 24 hr tablet Take 150 mg by mouth every morning.  12  . buPROPion (WELLBUTRIN XL) 300 MG 24 hr tablet Take 300 mg by mouth daily with breakfast.  12  . cyanocobalamin 1000 MCG tablet Take 1,000 mcg by mouth daily.    . diphenhydramine-acetaminophen (TYLENOL PM) 25-500 MG TABS tablet Take 1-2 tablets by  mouth at bedtime.    Eduard Roux (AIMOVIG) 140 MG/ML SOAJ Inject 140 mg into the skin every 28 (twenty-eight) days. 1 pen 11  . FLUoxetine (PROZAC) 40 MG capsule Take 40 mg by mouth daily with breakfast.  12  . ibuprofen (ADVIL,MOTRIN) 800 MG tablet ibuprofen 800 mg tablet  TAKE 1 TABLET 3 TIMES A DAY AS NEEDED FOR PAIN    . omeprazole (PRILOSEC) 40 MG capsule TAKE 1 CAPSULE EVERY DAY 90 capsule 0  . ondansetron (ZOFRAN-ODT) 4 MG disintegrating tablet TAKE 1 TABLET BY MOUTH EVERY 8 HOURS AS NEEDED FOR NAUSEA AND VOMITING 20 tablet 0  . pantoprazole (PROTONIX) 40 MG tablet TAKE 1 TABLET BY MOUTH TWICE A DAY 180 tablet 1  . phentermine 37.5 MG capsule Take 1 capsule (37.5 mg total) by mouth every morning. 30 capsule 2  . SUMAtriptan (IMITREX) 50 MG tablet Take 50 mg by mouth every 2 (two) hours as needed for migraine. May repeat in 2 hours if headache persists or recurs.    . topiramate (TOPAMAX) 25 MG tablet TAKE 3 TABLETS AT BEDTIME 60 tablet 0  . topiramate (TOPAMAX) 50 MG tablet TAKE 2 TABLETS (100 MG TOTAL) BY MOUTH AT BEDTIME. 180 tablet 1  . UBRELVY 100 MG TABS TAKE 1 TABLET BY MOUTH AS NEEDED MAY REPEAT IN 2 HOURS IF NEEDED MAX 2 TABLETS IN 24 HOURS 16 tablet 3   No current facility-administered medications on file prior to visit.    ALLERGIES: No Known Allergies  FAMILY HISTORY: Family History  Problem Relation Age of Onset  .  Heart failure Other   . Hypertension Other   . Cancer Other   . Anxiety disorder Other   . Depression Other   . Arthritis Mother   . Hypertension Mother   . Prostate cancer Father   . Skin cancer Father   . Congestive Heart Failure Father   . Hypertension Father     SOCIAL HISTORY: Social History   Socioeconomic History  . Marital status: Single    Spouse name: Not on file  . Number of children: 2  . Years of education: 40  . Highest education level: 12th grade  Occupational History  . Occupation: unemployed  Tobacco Use  . Smoking status: Former Smoker    Packs/day: 1.00    Years: 26.00    Pack years: 26.00    Quit date: 07/20/2012    Years since quitting: 7.7  . Smokeless tobacco: Never Used  Vaping Use  . Vaping Use: Never used  Substance and Sexual Activity  . Alcohol use: Yes    Comment: occasional  . Drug use: No  . Sexual activity: Yes    Birth control/protection: None  Other Topics Concern  . Not on file  Social History Narrative   Patient is right-handed. She lives in a 2 level home. Drinks one cup of coffee a day. She walks daily.   Social Determinants of Health   Financial Resource Strain:   . Difficulty of Paying Living Expenses: Not on file  Food Insecurity:   . Worried About Charity fundraiser in the Last Year: Not on file  . Ran Out of Food in the Last Year: Not on file  Transportation Needs:   . Lack of Transportation (Medical): Not on file  . Lack of Transportation (Non-Medical): Not on file  Physical Activity:   . Days of Exercise per Week: Not on file  . Minutes of Exercise per Session: Not on file  Stress:   . Feeling of Stress : Not on file  Social Connections:   . Frequency of Communication with Friends and Family: Not on file  . Frequency of Social Gatherings with Friends and Family: Not on file  . Attends Religious Services: Not on file  . Active Member of Clubs or Organizations: Not on file  . Attends Archivist  Meetings: Not on file  . Marital Status: Not on file  Intimate Partner Violence:   . Fear of Current or Ex-Partner: Not on file  . Emotionally Abused: Not on file  . Physically Abused: Not on file  . Sexually Abused: Not on file    PHYSICAL EXAM: Blood pressure 132/80, pulse 85, height 5\' 6"  (1.676 m), weight 226 lb (102.5 kg), SpO2 98 %. General: No acute distress.  Patient appears well-groomed.   Head:  Normocephalic/atraumatic Eyes:  Fundi examined but not visualized Neck: supple, no paraspinal tenderness, full range of motion Heart:  Regular rate and rhythm Lungs:  Clear to auscultation bilaterally Back: No paraspinal tenderness Neurological Exam: alert and oriented to person, place, and time. Attention span and concentration intact, recent and remote memory intact, fund of knowledge intact.  Speech fluent and not dysarthric, language intact.  CN II-XII intact. Bulk and tone normal, muscle strength 5/5 throughout.  Sensation to light touch, temperature and vibration intact.  Deep tendon reflexes 2+ throughout, toes downgoing.  Finger to nose and heel to shin testing intact.  Gait normal, Romberg negative.  IMPRESSION: Migraine without aura, without status migrainosus, not intractable  PLAN: 1.  For preventative management, start Nurtec 75mg  every other day.  Stop Aimovig 140mg  2.  For abortive therapy, Tosymra.  Stop Ubrelvy 100mg  3.  Limit use of pain relievers to no more than 2 days out of week to prevent risk of rebound or medication-overuse headache. 4.  Keep headache diary 5.  Exercise, hydration, caffeine cessation, sleep hygiene, monitor for and avoid triggers 6. Follow up 6 months   Metta Clines, DO  CC: Annye Asa, MD

## 2020-04-15 ENCOUNTER — Ambulatory Visit: Payer: BC Managed Care – PPO | Admitting: Neurology

## 2020-04-15 ENCOUNTER — Encounter: Payer: Self-pay | Admitting: Neurology

## 2020-04-15 ENCOUNTER — Other Ambulatory Visit: Payer: Self-pay

## 2020-04-15 VITALS — BP 132/80 | HR 85 | Ht 66.0 in | Wt 226.0 lb

## 2020-04-15 DIAGNOSIS — G43009 Migraine without aura, not intractable, without status migrainosus: Secondary | ICD-10-CM

## 2020-04-15 MED ORDER — TOSYMRA 10 MG/ACT NA SOLN: NASAL | 5 refills | Status: DC

## 2020-04-15 MED ORDER — NURTEC 75 MG PO TBDP: 75.0000 mg | ORAL_TABLET | ORAL | 5 refills | Status: DC

## 2020-04-15 NOTE — Patient Instructions (Addendum)
  1. Stop Aimovig.  Start Nurtec 1 tablet every other day.   2. Stop Ubrelvy.  Take Tosymra nasal spray at earliest onset of headache.  1 spray into one nostril.  May repeat dose once in 1 hour if needed.  Maximum 2 sprays in 24 hours. 3. Limit use of pain relievers to no more than 2 days out of the week.  These medications include acetaminophen, NSAIDs (ibuprofen/Advil/Motrin, naproxen/Aleve, triptans (Imitrex/sumatriptan), Excedrin, and narcotics.  This will help reduce risk of rebound headaches. 4. Be aware of common food triggers:  - Caffeine:  coffee, black tea, cola, Mt. Dew  - Chocolate  - Dairy:  aged cheeses (brie, blue, cheddar, gouda, [place], provolone, [place], Swiss, etc), chocolate milk, buttermilk, sour cream, limit eggs and yogurt  - Nuts, peanut butter  - Alcohol  - Cereals/grains:  FRESH breads (fresh bagels, sourdough, doughnuts), yeast productions  - Processed/canned/aged/cured meats (pre-packaged deli meats, hotdogs)  - MSG/glutamate:  soy sauce, flavor enhancer, pickled/preserved/marinated foods  - Sweeteners:  aspartame (Equal, Nutrasweet).  Sugar and Splenda are okay  - Vegetables:  legumes (lima beans, lentils, snow peas, fava beans, pinto peans, peas, garbanzo beans), sauerkraut, onions, olives, pickles  - Fruit:  avocados, bananas, citrus fruit (orange, lemon, grapefruit), mango  - Other:  Frozen meals, macaroni and cheese 5. Routine exercise 6. Stay adequately hydrated (aim for 64 oz water daily) 7. Keep headache diary 8. Maintain proper stress management 9. Maintain proper sleep hygiene 10. Do not skip meals 11. Consider supplements:  magnesium citrate 400mg  daily, riboflavin 400mg  daily, coenzyme Q10 100mg  three times daily. 12. Follow up in 6 months

## 2020-04-18 ENCOUNTER — Encounter: Payer: Self-pay | Admitting: Neurology

## 2020-04-18 NOTE — Progress Notes (Addendum)
Glass blower/designer (Key: [id number]) Tosymra 10MG /ACT solution   Form [employer] control surveyor Form (CB) Created 4 days ago Sent to Plan 2 days ago Plan Response 2 days ago Submit Clinical Questions 2 days ago Determination Favorable 9 minutes ago Message from Plan Effective from 04/18/2020 through 04/17/2023.

## 2020-04-29 ENCOUNTER — Other Ambulatory Visit: Payer: Self-pay | Admitting: Neurology

## 2020-04-29 ENCOUNTER — Other Ambulatory Visit: Payer: Self-pay

## 2020-04-29 MED ORDER — ZEMBRACE SYMTOUCH 3 MG/0.5ML ~~LOC~~ SOAJ: SUBCUTANEOUS | 5 refills | Status: DC

## 2020-04-29 MED ORDER — ZEMBRACE SYMTOUCH 3 MG/0.5ML ~~LOC~~ SOAJ: 3.0000 mg | Freq: Once | SUBCUTANEOUS | 1 refills | Status: DC | PRN

## 2020-05-10 ENCOUNTER — Ambulatory Visit (INDEPENDENT_AMBULATORY_CARE_PROVIDER_SITE_OTHER): Payer: BC Managed Care – PPO | Admitting: Family Medicine

## 2020-05-10 ENCOUNTER — Encounter: Payer: Self-pay | Admitting: Family Medicine

## 2020-05-10 ENCOUNTER — Other Ambulatory Visit: Payer: Self-pay

## 2020-05-10 VITALS — BP 122/78 | HR 83 | Temp 97.9°F | Resp 16 | Ht 66.0 in | Wt 227.4 lb

## 2020-05-10 DIAGNOSIS — Z23 Encounter for immunization: Secondary | ICD-10-CM | POA: Diagnosis not present

## 2020-05-10 DIAGNOSIS — K219 Gastro-esophageal reflux disease without esophagitis: Secondary | ICD-10-CM | POA: Diagnosis not present

## 2020-05-10 MED ORDER — SUCRALFATE 1 G PO TABS: 1.0000 g | ORAL_TABLET | Freq: Three times a day (TID) | ORAL | 0 refills | Status: DC

## 2020-05-10 MED ORDER — FAMOTIDINE 40 MG PO TABS: 40.0000 mg | ORAL_TABLET | Freq: Every day | ORAL | 3 refills | Status: DC

## 2020-05-10 NOTE — Progress Notes (Signed)
   Subjective:    Patient ID: Emma Wagner, female    DOB: 06/30/1971, 49 y.o.   MRN: [id number]  HPI GERD- sxs worsened 1-2 months ago.  Felt that medication was triggering GERD so she stopped all meds..  Denies change in diet.  Stopped caffeine due to worsening reflux but is drinking sugar free Red Bull.  Not smoking.  No increase in alcohol.  High stress levels w/ recent death of dad.  Currently on both Omeprazole 40mg  daily and Pantoprazole twice daily.  Pt reports GERD has improved over the last 2 weeks.  GERD will cause burning in the chest, vomiting.  Takes hours to resolve.   Review of Systems For ROS see HPI   This visit occurred during the SARS-CoV-2 public health emergency.  Safety protocols were in place, including screening questions prior to the visit, additional usage of staff PPE, and extensive cleaning of exam room while observing appropriate contact time as indicated for disinfecting solutions.       Objective:   Physical Exam Vitals reviewed.  Constitutional:      General: She is not in acute distress.    Appearance: Normal appearance. She is obese. She is not ill-appearing.  HENT:     Head: Normocephalic and atraumatic.  Eyes:     Extraocular Movements: Extraocular movements intact.     Pupils: Pupils are equal, round, and reactive to light.  Abdominal:     General: There is no distension.     Palpations: Abdomen is soft.     Tenderness: There is no abdominal tenderness. There is no rebound.  Neurological:     General: No focal deficit present.     Mental Status: She is alert and oriented to person, place, and time.     Cranial Nerves: No cranial nerve deficit.     Motor: No weakness.     Gait: Gait normal.  Psychiatric:        Mood and Affect: Mood normal.        Behavior: Behavior normal.        Thought Content: Thought content normal.           Assessment & Plan:

## 2020-05-10 NOTE — Patient Instructions (Signed)
Schedule your complete physical at your convenience STOP the Omeprazole START the Famotidine nightly CONTINUE the Pantoprazole twice daily LIMIT caffeine (including red bull), alcohol, acidic or spicy foods IF you develop symptoms, start the Sucralfate prior to eating and before bed Make sure you are eating regularly to avoid the stomach being empty.  B/c then the acid has nothing to work on [employer] call you with your GI appt Call with any questions or concerns Hang in there!

## 2020-05-15 NOTE — Assessment & Plan Note (Signed)
Deteriorated.  Suspect this is stress related w/ death of her father.  States she is currently asymptomatic but fears this will return.  When sxs occur, they are severe and she will have nausea and vomiting.  Will switch her acid suppression as she is on 2 different PPIs.  Will switch Omeprazole for Famotidine.  Zofran and Carafate prn.  Also discussed dietary and lifestyle changes that will improve sxs- limiting caffeine.  Will refer to GI for complete evaluation.  Reviewed supportive care and red flags that should prompt return.  Pt expressed understanding and is in agreement w/ plan.

## 2020-05-20 ENCOUNTER — Other Ambulatory Visit: Payer: Self-pay | Admitting: Family Medicine

## 2020-05-28 ENCOUNTER — Other Ambulatory Visit: Payer: Self-pay | Admitting: Family Medicine

## 2020-05-30 NOTE — Telephone Encounter (Signed)
Please advise, should pt still be on this?

## 2020-06-10 ENCOUNTER — Other Ambulatory Visit: Payer: Self-pay | Admitting: Neurology

## 2020-06-10 ENCOUNTER — Other Ambulatory Visit: Payer: Self-pay | Admitting: Family Medicine

## 2020-06-13 ENCOUNTER — Encounter: Payer: Self-pay | Admitting: Neurology

## 2020-06-13 NOTE — Progress Notes (Addendum)
KEYIRA MONDESIR (Key: [id number]) Aimovig 140MG /ML auto-injectors   Form [employer] control surveyor Form (CB) Created 1 day ago Sent to Plan 1 day ago Plan Response 1 day ago Submit Clinical Questions 1 day ago Determination Favorable 2 hours ago Message from Plan Effective from 06/13/2020 through 06/12/2021.

## 2020-06-15 ENCOUNTER — Encounter: Payer: Self-pay | Admitting: Gastroenterology

## 2020-06-16 ENCOUNTER — Ambulatory Visit (INDEPENDENT_AMBULATORY_CARE_PROVIDER_SITE_OTHER): Payer: BC Managed Care – PPO

## 2020-06-16 ENCOUNTER — Ambulatory Visit (HOSPITAL_COMMUNITY)
Admission: EM | Admit: 2020-06-16 | Discharge: 2020-06-16 | Disposition: A | Discharge status: Home / Self Care | Payer: BC Managed Care – PPO | Attending: Family Medicine | Admitting: Family Medicine

## 2020-06-16 ENCOUNTER — Encounter (HOSPITAL_COMMUNITY): Payer: Self-pay | Admitting: Emergency Medicine

## 2020-06-16 ENCOUNTER — Other Ambulatory Visit: Payer: Self-pay

## 2020-06-16 DIAGNOSIS — R509 Fever, unspecified: Secondary | ICD-10-CM | POA: Diagnosis not present

## 2020-06-16 DIAGNOSIS — R079 Chest pain, unspecified: Secondary | ICD-10-CM | POA: Insufficient documentation

## 2020-06-16 DIAGNOSIS — R110 Nausea: Secondary | ICD-10-CM

## 2020-06-16 DIAGNOSIS — Z20822 Contact with and (suspected) exposure to covid-19: Secondary | ICD-10-CM | POA: Diagnosis not present

## 2020-06-16 DIAGNOSIS — Z87891 Personal history of nicotine dependence: Secondary | ICD-10-CM | POA: Insufficient documentation

## 2020-06-16 DIAGNOSIS — R52 Pain, unspecified: Secondary | ICD-10-CM

## 2020-06-16 DIAGNOSIS — R6883 Chills (without fever): Secondary | ICD-10-CM | POA: Diagnosis not present

## 2020-06-16 DIAGNOSIS — R059 Cough, unspecified: Secondary | ICD-10-CM | POA: Insufficient documentation

## 2020-06-16 NOTE — ED Notes (Signed)
covid sample in lab

## 2020-06-16 NOTE — ED Notes (Signed)
Dr hagler says we can see patient here

## 2020-06-16 NOTE — ED Triage Notes (Addendum)
chest pain for 3 days .  Chest pain kept patient awake last night Patient has developed a headache, nausea, low grade fever (99. ?).  Has had ibuprofen 800 mg earlier today.  Minimal phlegm with coughing.  Points to center chest as location of chest pain.  Lying down, certain positions, swallowing makes it hurt.    Patient has an appt for gi in December, but says this does not feel like reflux

## 2020-06-16 NOTE — Discharge Instructions (Addendum)
You have been seen at the [facility] today for chest pain. Your evaluation today was not suggestive of any emergent condition requiring medical intervention at this time. Your ECG (heart tracing) did not show any worrisome changes. However, some medical problems make take more time to appear. Therefore, it's very important that you pay attention to any new symptoms or worsening of your current condition.  Please proceed directly to the Emergency Department immediately should you feel worse in any way or have any of the following symptoms: increasing or different chest pain, pain that spreads to your arm, neck, jaw, back or abdomen, shortness of breath, or nausea and vomiting.  You have been tested for COVID-19 today. If your test returns positive, you will receive a phone call from [facility] regarding your results. Negative test results are not called. Both positive and negative results area always visible on MyChart. If you do not have a MyChart account, sign up instructions are provided in your discharge papers. Please do not hesitate to contact us should you have questions or concerns.

## 2020-06-17 LAB — SARS CORONAVIRUS 2 (TAT 6-24 HRS): SARS Coronavirus 2: NEGATIVE

## 2020-06-21 NOTE — ED Provider Notes (Signed)
[place]   [id number] 06/16/20 Arrival Time: 9983  ASSESSMENT & PLAN:  1. Chest pain, unspecified type   2. Chills   3. Body aches     I have personally viewed the imaging studies ordered this visit. No acute changes.  COVID-19 testing sent. See letter/work note on file for self-isolation guidelines. OTC symptom care as needed.    Follow-up Information    [facility].   Specialty: Emergency Medicine Why: If symptoms worsen in any way. Contact information: [address] [id number] [place] [phone]              Reviewed expectations re: course of current medical issues. Questions answered. Outlined signs and symptoms indicating need for more acute intervention. Understanding verbalized. After Visit Summary given.   SUBJECTIVE: History from: patient. Emma Wagner is a 49 y.o. female who reports chest pain; on/off over past 2-3 d; also with reported low grade temp and mild nausea; no emesis. Mild HA at time. Coughing yest and today. No abd or back pain. No sick contacts. Ibup with some relief.  Social History   Tobacco Use  Smoking Status Former Smoker  . Packs/day: 1.00  . Years: 26.00  . Pack years: 26.00  . Quit date: 07/20/2012  . Years since quitting: 7.9  Smokeless Tobacco Never Used    OBJECTIVE:  Vitals:   06/16/20 1520  BP: (!) 134/98  Pulse: 85  Resp: 20  Temp: 98.1 F (36.7 C)  TempSrc: Oral  SpO2: 100%    General appearance: alert; no distress Eyes: PERRLA; EOMI; conjunctiva normal HENT: [place]; AT; with nasal congestion Neck: supple  Lungs: speaks full sentences without difficulty; unlabored CV: RRR Extremities: no edema Skin: warm and dry Neurologic: normal gait Psychological: alert and cooperative; normal mood and affect  Labs:  Labs Reviewed  SARS CORONAVIRUS 2 (TAT 6-24 HRS)    Imaging: DG Chest 2 View  Result Date:  06/16/2020 CLINICAL DATA:  Chest pain over the last 3 days. Nausea. Low-grade fever. EXAM: CHEST - 2 VIEW COMPARISON:  04/11/2018 FINDINGS: Heart size is normal. Mild tortuosity of the aorta. Mild central bronchial thickening. No infiltrate, collapse or effusion. No significant bone finding. IMPRESSION: Possible bronchitis. No consolidation or collapse. Electronically Signed   By: Nelson Chimes M.D.   On: 06/16/2020 15:50    No Known Allergies  Past Medical History:  Diagnosis Date  . Anxiety    add also  . Arthritis   . Borderline type 2 diabetes mellitus since 2013   -lost weight and quit smoking and is no longer PRE diabetic  . Bursitis    bilateral hips, more on right hip  . Chronic kidney disease    h/o kidney stone 2016  . Constipation   . Depression   . Frequent urination    while kidney stone evident  . GERD (gastroesophageal reflux disease)   . Headache    "every blue moon. not often"  . Hemorrhoids   . History of kidney stones   . Pneumonia   . PONV (postoperative nausea and vomiting)    nausea with appendectomy and with hip replacement in March 2017   Social History   Socioeconomic History  . Marital status: Single    Spouse name: Not on file  . Number of children: 2  . Years of education: 44  . Highest education level: 12th grade  Occupational History  . Occupation: unemployed  Tobacco Use  .  Smoking status: Former Smoker    Packs/day: 1.00    Years: 26.00    Pack years: 26.00    Quit date: 07/20/2012    Years since quitting: 7.9  . Smokeless tobacco: Never Used  Vaping Use  . Vaping Use: Never used  Substance and Sexual Activity  . Alcohol use: Yes    Comment: occasional  . Drug use: No  . Sexual activity: Yes    Birth control/protection: None  Other Topics Concern  . Not on file  Social History Narrative   Patient is right-handed. She lives in a 2 level home. Drinks one cup of coffee a day. She walks daily.   Social Determinants of Health    Financial Resource Strain:   . Difficulty of Paying Living Expenses: Not on file  Food Insecurity:   . Worried About Charity fundraiser in the Last Year: Not on file  . Ran Out of Food in the Last Year: Not on file  Transportation Needs:   . Lack of Transportation (Medical): Not on file  . Lack of Transportation (Non-Medical): Not on file  Physical Activity:   . Days of Exercise per Week: Not on file  . Minutes of Exercise per Session: Not on file  Stress:   . Feeling of Stress : Not on file  Social Connections:   . Frequency of Communication with Friends and Family: Not on file  . Frequency of Social Gatherings with Friends and Family: Not on file  . Attends Religious Services: Not on file  . Active Member of Clubs or Organizations: Not on file  . Attends Archivist Meetings: Not on file  . Marital Status: Not on file  Intimate Partner Violence:   . Fear of Current or Ex-Partner: Not on file  . Emotionally Abused: Not on file  . Physically Abused: Not on file  . Sexually Abused: Not on file   Family History  Problem Relation Age of Onset  . Heart failure Other   . Hypertension Other   . Cancer Other   . Anxiety disorder Other   . Depression Other   . Arthritis Mother   . Hypertension Mother   . Prostate cancer Father   . Skin cancer Father   . Congestive Heart Failure Father   . Hypertension Father    Past Surgical History:  Procedure Laterality Date  . APPENDECTOMY  1985  . CESAREAN SECTION  2009  . 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 TOTAL HIP ARTHROPLASTY Right 06/13/2016   Procedure: TOTAL HIP ARTHROPLASTY ANTERIOR APPROACH;  Surgeon: Frederik Pear, MD;  Location: [facility];  Service: Orthopedics;  Laterality: Right;  . TUBAL LIGATION  2009  . wisdom Haskell Riling, MD 06/21/20 1106

## 2020-07-26 ENCOUNTER — Other Ambulatory Visit: Payer: Self-pay | Admitting: Neurology

## 2020-07-26 MED ORDER — UBRELVY 100 MG PO TABS: ORAL_TABLET | ORAL | 3 refills | Status: DC

## 2020-08-03 ENCOUNTER — Encounter: Payer: Self-pay | Admitting: Gastroenterology

## 2020-08-03 ENCOUNTER — Ambulatory Visit (INDEPENDENT_AMBULATORY_CARE_PROVIDER_SITE_OTHER): Payer: BC Managed Care – PPO | Admitting: Gastroenterology

## 2020-08-03 VITALS — BP 144/100 | HR 105 | Ht 66.0 in | Wt 233.0 lb

## 2020-08-03 DIAGNOSIS — E669 Obesity, unspecified: Secondary | ICD-10-CM

## 2020-08-03 DIAGNOSIS — K219 Gastro-esophageal reflux disease without esophagitis: Secondary | ICD-10-CM

## 2020-08-03 MED ORDER — PANTOPRAZOLE SODIUM 40 MG PO TBEC
DELAYED_RELEASE_TABLET | ORAL | 3 refills | Status: DC
Start: 2020-08-03 — End: 2021-11-03

## 2020-08-03 MED ORDER — FAMOTIDINE 20 MG PO TABS
20.0000 mg | ORAL_TABLET | Freq: Every day | ORAL | Status: DC
Start: 2020-08-03 — End: 2021-05-10

## 2020-08-03 NOTE — Patient Instructions (Signed)
If you are age 49 or younger, your body mass index should be between 19-25. Your Body mass index is 37.61 kg/m. If this is out of the aformentioned range listed, please consider follow up with your Primary Care Provider.   We have sent the following medications to your pharmacy for you to pick up at your convenience:  Please take pantoprazole one tablet 20 to 30 minutes prior to breakfast and dinner meals.  Take pepcid 20mg  one tablet at bedtime daily.  You will follow up with our office on an as needed basis.  Thank you for entrusting me with your care and choosing [facility].  Dr Ardis Hughs

## 2020-08-03 NOTE — Progress Notes (Signed)
HPI: This is a very pleasant 49 year old woman who was referred to me by Midge Minium, MD  to evaluate GERD.    She has had trouble with acid issues on and off for many years.  They have gotten dramatically worse over the past year or 2.  Over the same time.  She has gained about 60 pounds since the passing of her father.  She is clearly still having a lot of emotional difficulty with his passing.  I suspect they were quite close.  She was taking omeprazole once daily previously and that seemed to be failing.  She was having pyrosis, painful substernal chest discomforts, nausea at times.  Recently her primary care physician changed her regimen to pantoprazole 40 mg twice daily and also Carafate 1 g twice daily.  She takes her pantoprazole shortly before breakfast and also at bedtime.  She also stopped drinking coffee completely and she starts taking her medicines at night instead of in the morning.  All of these things together, the lifestyle modifications as well as her change antiacid therapy has significantly improved her heartburn symptoms.  She has had no dysphagia.  She has no overt GI bleeding.  She will get breakthrough pyrosis about once per month only.  She has gained 60 pounds in the past 2 years    Review of systems: Pertinent positive and negative review of systems were noted in the above HPI section. All other review negative.   Past Medical History:  Diagnosis Date  . Anxiety    add also  . Arthritis   . Borderline type 2 diabetes mellitus since 2013   -lost weight and quit smoking and is no longer PRE diabetic  . Bursitis    bilateral hips, more on right hip  . Chronic kidney disease    h/o kidney stone 2016  . Constipation   . Depression   . Frequent urination    while kidney stone evident  . GERD (gastroesophageal reflux disease)   . Headache    "every blue moon. not often"  . Hemorrhoids   . History of kidney stones   . Pneumonia   . PONV (postoperative  nausea and vomiting)    nausea with appendectomy and with hip replacement in March 2017    Past Surgical History:  Procedure Laterality Date  . APPENDECTOMY  1985  . CESAREAN SECTION  2009  . 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 TOTAL HIP ARTHROPLASTY Right 06/13/2016   Procedure: TOTAL HIP ARTHROPLASTY ANTERIOR APPROACH;  Surgeon: Frederik Pear, MD;  Location: [facility];  Service: Orthopedics;  Laterality: Right;  . TUBAL LIGATION  2009  . wisdom teth      Current Outpatient Medications  Medication Sig Dispense Refill  . buPROPion (WELLBUTRIN XL) 150 MG 24 hr tablet Take 150 mg by mouth every morning.  12  . buPROPion (WELLBUTRIN XL) 300 MG 24 hr tablet Take 300 mg by mouth daily with breakfast.  12  . diphenhydramine-acetaminophen (TYLENOL PM) 25-500 MG TABS tablet Take 1-2 tablets by mouth at bedtime.    . famotidine (PEPCID) 40 MG tablet Take 1 tablet (40 mg total) by mouth at bedtime. 30 tablet 3  . FLUoxetine (PROZAC) 40 MG capsule Take 40 mg by mouth daily with breakfast.  12  . ibuprofen (ADVIL,MOTRIN) 800 MG tablet ibuprofen 800 mg tablet  TAKE  1 TABLET 3 TIMES A DAY AS NEEDED FOR PAIN    . ondansetron (ZOFRAN-ODT) 4 MG disintegrating tablet TAKE 1 TABLET BY MOUTH EVERY 8 HOURS AS NEEDED FOR NAUSEA AND VOMITING 20 tablet 0  . pantoprazole (PROTONIX) 40 MG tablet TAKE 1 TABLET BY MOUTH TWICE A DAY 180 tablet 1  . Rimegepant Sulfate (NURTEC) 75 MG TBDP Take 75 mg by mouth every other day. 16 tablet 5  . sucralfate (CARAFATE) 1 g tablet TAKE 1 TABLET (1 G TOTAL) BY MOUTH 4 (FOUR) TIMES DAILY - WITH MEALS AND AT BEDTIME. (Patient taking differently: Take 1 g by mouth 2 (two) times daily.) 90 tablet 0  . SUMAtriptan Succinate (ZEMBRACE SYMTOUCH) 3 MG/0.5ML SOAJ 1 injection into skin.  May repeat in 1 hour.  Maximum 2 injections in 24 hours.  (Patient taking differently: 1 injection into skin.  May repeat in 1 hour.  Maximum 2 injections in 24 hours.) 0.5 mL 5  . SUMAtriptan Succinate (ZEMBRACE SYMTOUCH) 3 MG/0.5ML SOAJ Inject 3 mg into the skin Once PRN for up to 1 dose. 0.5 mL 1  . Ubrogepant (UBRELVY) 100 MG TABS TAKE 1 TABLET BY MOUTH AS NEEDED MAY REPEAT IN 2 HOURS IF NEEDED MAX 2 TABLETS IN 24 HOURS 16 tablet 3   No current facility-administered medications for this visit.    Allergies as of 08/03/2020  . (No Known Allergies)    Family History  Problem Relation Age of Onset  . Heart failure Other   . Hypertension Other   . Cancer Other   . Anxiety disorder Other   . Depression Other   . Arthritis Mother   . Hypertension Mother   . Prostate cancer Father   . Skin cancer Father   . Congestive Heart Failure Father   . Hypertension Father     Social History   Socioeconomic History  . Marital status: Single    Spouse name: Not on file  . Number of children: 2  . Years of education: 72  . Highest education level: 12th grade  Occupational History  . Occupation: unemployed  Tobacco Use  . Smoking status: Former Smoker    Packs/day: 1.00    Years: 26.00    Pack years: 26.00    Quit date: 07/20/2012    Years since quitting: 8.0  . Smokeless tobacco: Never Used  Vaping Use  . Vaping Use: Never used  Substance and Sexual Activity  . Alcohol use: Yes    Comment: occasional  . Drug use: No  . Sexual activity: Yes    Birth control/protection: None  Other Topics Concern  . Not on file  Social History Narrative   Patient is right-handed. She lives in a 2 level home. Drinks one cup of coffee a day. She walks daily.   Social Determinants of Health   Financial Resource Strain: Not on file  Food Insecurity: Not on file  Transportation Needs: Not on file  Physical Activity: Not on file  Stress: Not on file  Social Connections: Not on file  Intimate Partner Violence: Not on file     Physical Exam: BP  (!) 144/100   Pulse (!) 105   Ht 5\' 6"  (1.676 m)   Wt 233 lb (105.7 kg)   LMP 07/20/2020 (Approximate)   BMI 37.61 kg/m  Constitutional: generally well-appearing Psychiatric: alert and oriented x3 Eyes: extraocular movements intact Mouth: oral pharynx moist, no lesions Neck: supple no lymphadenopathy Cardiovascular: heart regular rate and rhythm Lungs: clear to  auscultation bilaterally Abdomen: soft, nontender, nondistended, no obvious ascites, no peritoneal signs, normal bowel sounds Extremities: no lower extremity edema bilaterally Skin: no lesions on visible extremities   Assessment and plan: 49 y.o. female with chronic GERD, obesity  She has GERD without alarm symptoms.  Her GERD seem to be pretty well controlled on proton pump inhibitor twice daily despite the fact that she is not taking her evening dose at the correct time in relation to meals.  I educated her about that and so she will start taking her pantoprazole 20 to 30 minutes before breakfast and also 20 to 30 minutes before dinner.  Told her she can stop taking the Carafate as I do not think it adds much control in most acid symptoms.  I recommended if she has breakthrough to consider taking Tums or perhaps a Pepcid 20 mg.  She can also try taking Pepcid 20 mg at bedtime every night and that would be complete medical therapy.  We discussed the fact that her weight gain is probably contributing to her worsening GERD.  She is well aware of that.  She is really struggling to get that under control she will keep trying.  She will return to see me on as-needed basis, knows to call if she has any further questions or concerns.  Do not think she needs any endoscopic testing right now because she has no alarm symptoms.  She was quite relieved to hear that    Please see the "Patient Instructions" section for addition details about the plan.   Owens Loffler, MD [facility] 08/03/2020, 2:45 PM  Cc: Midge Minium, MD  Total time on date of encounter was 45  minutes (this included time spent preparing to see the patient reviewing records; obtaining and/or reviewing separately obtained history; performing a medically appropriate exam and/or evaluation; counseling and educating the patient and family if present; ordering medications, tests or procedures if applicable; and documenting clinical information in the health record).

## 2020-09-06 ENCOUNTER — Encounter: Payer: Self-pay | Admitting: Neurology

## 2020-09-06 NOTE — Progress Notes (Signed)
Vernetta Honey (KeyGillie Manners) Rx #: [id number] Roselyn Meier 100MG  tablets   Form [employer] Electronic PA Form [phone] NCPDP) Created 3 days ago Sent to Plan 14 minutes ago Plan Response 12 minutes ago Submit Clinical Questions 9 minutes ago Determination Favorable 8 minutes ago Message from [employer] Your PA request has been approved. Approval valid from 09/06/20 to 09/06/21.

## 2020-09-06 NOTE — Progress Notes (Signed)
ONI DIETZMAN (Key: [id number]) Rx #: [id number] Nurtec 75MG  dispersible tablets   Form [employer] Electronic PA Form [phone] NCPDP) Created 3 days ago Sent to Plan 18 minutes ago Plan Response 18 minutes ago Submit Clinical Questions 16 minutes ago Determination Favorable 15 minutes ago Message from [employer] Your PA request has been approved. Approval valid from 09/06/20 to 09/06/21.

## 2020-09-10 ENCOUNTER — Other Ambulatory Visit: Payer: Self-pay | Admitting: Family Medicine

## 2020-10-20 ENCOUNTER — Ambulatory Visit: Payer: BC Managed Care – PPO | Admitting: Neurology

## 2020-10-20 NOTE — Progress Notes (Signed)
Virtual Visit via Video Note The purpose of this virtual visit is to provide medical care while limiting exposure to the novel coronavirus.    Consent was obtained for video visit:  Yes.   Answered questions that patient had about telehealth interaction:  Yes.   I discussed the limitations, risks, security and privacy concerns of performing an evaluation and management service by telemedicine. I also discussed with the patient that there may be a patient responsible charge related to this service. The patient expressed understanding and agreed to proceed.  Pt location: Home Physician Location: office Name of referring provider:  Midge Minium, MD I connected with Emma Wagner at patients initiation/request on 10/21/2020 at  1:30 PM EST by video enabled telemedicine application and verified that I am speaking with the correct person using two identifiers. Pt MRN:  [id number] Pt DOB:  07/23/1971 Video Participants:  Emma Wagner  Assessment and Plan:   1.  Chronic migraine without aura, without status migrainosus, not intractable - over 3 consecutive months of near daily headache - failed multiple preventatives such as topiramate, venlafaxine, Aimovig and Nurtec, unable to take beta blockers due to dizziness.  She meets criteria for Botox. 2.  Probable bilateral carpal tunnel syndrome  1.  Migraine prevention:  Stop Nurtec.  Will start Botox 2.  Migraine rescue:  Zembrace SymTouch 3.  Wrist splints 4.  Limit use of pain relievers to no more than 2 days out of week to prevent risk of rebound or medication-overuse headache. 5.  Keep headache diary 6.  Follow up for Botox  History of Present Illness:  Emma Wagner is a 50year old womanwith history of kidney stoneswhofollows up for migraines.  UPDATE: Started Nurtec preventative in August.  Tosymra NS ineffective and caused burning.  Started [place], which works but makes her drowsy for an hour.  She requested to restart  Ubrelvy for work, but not effective.  Intensity:8/10 Duration:Usually1 hour with Zembrace SymTouch. Frequency:almost daily headaches (in last 30 days, about 5 to 8 times were significant migraines)  She also reports bilateral hand numbness.  Started wearing wrist splints which helps, especially at night.    Current NSAIDS:ibuprofen 800mg   Current analgesics:Tylenol PM Current triptans:none Current ergotamine:none Current anti-emetic:Zofran-ODT 4mg  Current muscle relaxants:none Current anti-anxiolytic:none Current sleep aide:Tylenol PM Current Antihypertensive medications:none Current Antidepressant medications:Wellbutrin, Prozac 40mg  Current Anticonvulsant medications:none Current anti-CGRP:Nurtec QOD, Ubrelvy 100mg  Current Vitamins/Herbal/Supplements:B12 Current Antihistamines/Decongestants:Tylenol PM Other therapy:Meditation, Daith piercing in both ears (helped) Hormone/birth control:none Other medications:Adderall  Caffeine:1 cup of coffee daily Diet:Improved diet to lose weight. Increased water intake. No soda. Exercise:yes Depression:controlled; Anxiety:controlled Other pain:History of neck and back pain. Sleep hygiene:Difficult to fall asleep. With headache, she may wake up during the night.  HISTORY: Onset:. Increased frequency over the years. She wakes up with some sort of headache daily. Location:Either left or right sided (including periorbital area) Quality:Sometimes throbbing and sometimes non-throbbing Initial intensity:8/10.Shedenies new headache, thunderclap headache or severe headache that wakes herfrom sleep. Aura:no Premonitory Phase:no Postdrome:Fatigued, exhausted for a day Associated symptoms:Nausea, vomiting, photophobia, phonophobia, osmophobia,.Shedenies associated visual disturbance, autonomic symptoms, orunilateral numbness or weakness. InitialDuration:2 to 3  days InitialFrequency:Daily headaches;2 to 3 daysof migrainea week Triggers: Unknown Relieving factors:Sleep in dark and quiet room, meditation Activity:Aggravates She wakes up every morning with some type of diffuse dull headache. She treats these headaches every morning with either rizatriptan, Fioricet (for about a year)  Past NSAIDS:ASA, Celebrex, ibuprofen Past analgesics:Tylenol, Excedrin Migraine, Fioricet Past abortive triptans:Rizatriptan 10mg ,Sumatriptan 50mg (previously effective),  Tosymra NS, Zembrace SymTouch Past abortive ergotamine:none Past muscle relaxants:Flexeril, Robaxin Past anti-emetic:none Past antihypertensive medications:None. However, she reports history of dizziness. Past antidepressant medications:Venlafaxine Past anticonvulsant medications:topiramate 100mg ,gabapentin Past anti-CGRP:Aimovig 140mg  Past vitamins/Herbal/Supplements:none Past antihistamines/decongestants:Flonase, Allegra Other past therapies:none   Family history of headache:Mother  MRI of cervical spine from 06/19/18 personally reviewed and showed broad-based posterior disc osteophyte complex at C5-6 causing mild spinal stenosis and moderate to severe right and mild-moderate left neural foraminal stenosis.  Past Medical History: Past Medical History:  Diagnosis Date  . Anxiety    add also  . Arthritis   . Borderline type 2 diabetes mellitus since 2013   -lost weight and quit smoking and is no longer PRE diabetic  . Bursitis    bilateral hips, more on right hip  . Chronic kidney disease    h/o kidney stone 2016  . Constipation   . Depression   . Frequent urination    while kidney stone evident  . GERD (gastroesophageal reflux disease)   . Headache    "every blue moon. not often"  . Hemorrhoids   . History of kidney stones   . Pneumonia   . PONV (postoperative nausea and vomiting)    nausea with appendectomy and with hip replacement  in March 2017    Medications: Outpatient Encounter Medications as of 10/21/2020  Medication Sig  . buPROPion (WELLBUTRIN XL) 150 MG 24 hr tablet Take 150 mg by mouth every morning.  Marland Kitchen buPROPion (WELLBUTRIN XL) 300 MG 24 hr tablet Take 300 mg by mouth daily with breakfast.  . diphenhydramine-acetaminophen (TYLENOL PM) 25-500 MG TABS tablet Take 1-2 tablets by mouth at bedtime.  . famotidine (PEPCID) 20 MG tablet Take 1 tablet (20 mg total) by mouth at bedtime.  . famotidine (PEPCID) 40 MG tablet TAKE 1 TABLET BY MOUTH EVERYDAY AT BEDTIME  . FLUoxetine (PROZAC) 40 MG capsule Take 40 mg by mouth daily with breakfast.  . ibuprofen (ADVIL,MOTRIN) 800 MG tablet ibuprofen 800 mg tablet  TAKE 1 TABLET 3 TIMES A DAY AS NEEDED FOR PAIN  . ondansetron (ZOFRAN-ODT) 4 MG disintegrating tablet TAKE 1 TABLET BY MOUTH EVERY 8 HOURS AS NEEDED FOR NAUSEA AND VOMITING  . pantoprazole (PROTONIX) 40 MG tablet Take 1 tablet 20 to 30 minutes before breakfast and 1 tablet 20 to 30 minutes before dinner meal.\  . Rimegepant Sulfate (NURTEC) 75 MG TBDP Take 75 mg by mouth every other day.  . sucralfate (CARAFATE) 1 g tablet TAKE 1 TABLET (1 G TOTAL) BY MOUTH 4 (FOUR) TIMES DAILY - WITH MEALS AND AT BEDTIME. (Patient taking differently: Take 1 g by mouth 2 (two) times daily.)  . SUMAtriptan Succinate (ZEMBRACE SYMTOUCH) 3 MG/0.5ML SOAJ 1 injection into skin.  May repeat in 1 hour.  Maximum 2 injections in 24 hours. (Patient taking differently: 1 injection into skin.  May repeat in 1 hour.  Maximum 2 injections in 24 hours.)  . SUMAtriptan Succinate (ZEMBRACE SYMTOUCH) 3 MG/0.5ML SOAJ Inject 3 mg into the skin Once PRN for up to 1 dose.  Marland Kitchen Ubrogepant (UBRELVY) 100 MG TABS TAKE 1 TABLET BY MOUTH AS NEEDED MAY REPEAT IN 2 HOURS IF NEEDED MAX 2 TABLETS IN 24 HOURS   No facility-administered encounter medications on file as of 10/21/2020.    Allergies: No Known Allergies  Family History: Family History  Problem Relation  Age of Onset  . Heart failure Other   . Hypertension Other   . Cancer Other   . Anxiety disorder  Other   . Depression Other   . Arthritis Mother   . Hypertension Mother   . Prostate cancer Father   . Skin cancer Father   . Congestive Heart Failure Father   . Hypertension Father     Observations/Objective:   Height 5\' 6"  (1.676 m), weight 220 lb (99.8 kg). No acute distress.  Alert and oriented.  Speech fluent and not dysarthric.  Language intact.     Follow Up Instructions:    -I discussed the assessment and treatment plan with the patient. The patient was provided an opportunity to ask questions and all were answered. The patient agreed with the plan and demonstrated an understanding of the instructions.   The patient was advised to call back or seek an in-person evaluation if the symptoms worsen or if the condition fails to improve as anticipated.   Dudley Major, DO

## 2020-10-21 ENCOUNTER — Telehealth (INDEPENDENT_AMBULATORY_CARE_PROVIDER_SITE_OTHER): Payer: Self-pay | Admitting: Neurology

## 2020-10-21 ENCOUNTER — Other Ambulatory Visit: Payer: Self-pay

## 2020-10-21 ENCOUNTER — Encounter: Payer: Self-pay | Admitting: Neurology

## 2020-10-21 VITALS — Ht 66.0 in | Wt 220.0 lb

## 2020-10-21 DIAGNOSIS — G43709 Chronic migraine without aura, not intractable, without status migrainosus: Secondary | ICD-10-CM

## 2020-10-21 DIAGNOSIS — G5603 Carpal tunnel syndrome, bilateral upper limbs: Secondary | ICD-10-CM

## 2020-10-21 NOTE — Patient Instructions (Signed)
1.  Migraine prevention:  Stop Nurtec.  Will start Botox 2.  Migraine rescue:  Zembrace SymTouch 3.  Wrist splints 4.  Limit use of pain relievers to no more than 2 days out of week to prevent risk of rebound or medication-overuse headache. 5.  Keep headache diary 6.  Follow up for Botox

## 2020-11-07 ENCOUNTER — Encounter: Payer: Self-pay | Admitting: Neurology

## 2020-11-07 NOTE — Progress Notes (Addendum)
Received BV back from [facility] for patient's botox. PA is required. [facility] is SP.   3/21-called [facility] and set up acct/gave verbal for Botox. Awaiting PA submission from them.   3/31-received fax approval valid from 12/12/20 to 06/09/21 for Botox Auth #: [id number]. Valid for 3 visits.

## 2020-11-09 DIAGNOSIS — J209 Acute bronchitis, unspecified: Secondary | ICD-10-CM | POA: Diagnosis not present

## 2020-11-09 DIAGNOSIS — B349 Viral infection, unspecified: Secondary | ICD-10-CM | POA: Diagnosis not present

## 2021-01-01 ENCOUNTER — Other Ambulatory Visit: Payer: Self-pay | Admitting: Neurology

## 2021-01-02 MED ORDER — ZEMBRACE SYMTOUCH 3 MG/0.5ML ~~LOC~~ SOAJ: 3.0000 mg | Freq: Once | SUBCUTANEOUS | 1 refills | Status: DC | PRN

## 2021-01-02 NOTE — Telephone Encounter (Signed)
Per pt she taking it til her Botox appt. Per pt she needs Zymbrace refill.   Medications refilled

## 2021-01-19 ENCOUNTER — Telehealth: Payer: Self-pay

## 2021-01-19 NOTE — Telephone Encounter (Signed)
[facility] need to be the pharmacy now on PA to dispense Botox.    Emma Schwartz do we just call the pharmacy or redue  The whole PA

## 2021-01-23 NOTE — Telephone Encounter (Signed)
[place] usually the PA is not specific to a pharmacy.  The new pharmacy can verify with the patient's insurance that the PA is current and valid and dispense the drug.

## 2021-01-27 ENCOUNTER — Telehealth: Payer: Self-pay | Admitting: *Deleted

## 2021-01-27 NOTE — Telephone Encounter (Signed)
Spoke with patient and gave her [place] number to call to set up shipment. [phone] She will call and approve it and I told her to tell them we need it Tuesday. We will approve delivery when they call us for it after she sets up. Told her to have insurance card available when she calls.   I told her I will be gone for the rest of the day and to ask to speak to [place] or Vita Barley if she has any problems getting Botox sent to [place] we are now in a time crunch for Friday.   She had questions about her Zembrace and I read from her chart that the latest RX was from 05/11 and sent to [facility] on [address]. Advised her to call them to see if they have the script.

## 2021-01-31 NOTE — Telephone Encounter (Signed)
Patient called and left a voice mail that her pharmacy is needing prior authorization done.

## 2021-02-01 NOTE — Telephone Encounter (Signed)
Call [phone] tomorrow to follow up on pt botox

## 2021-02-02 NOTE — Telephone Encounter (Signed)
Per pt she is okat right now with the [place].  Pt states she do mind trying another injection if she has to. She do mind waiting on Botox.

## 2021-02-02 NOTE — Telephone Encounter (Signed)
Patient called to check on the status of the Botox PA in relation the her appointment tomorrow at 7:50 AM.

## 2021-02-02 NOTE — Telephone Encounter (Signed)
Spoke with Pt insurance Her Botox will not be ready so that it can be shipped for her to get botox tomorrow we need to cancel her appointment for tomorrow and reschedule her, per pt insurance everything will be ready for next week. They will call the pt after they get everything approved to get her consent to have botox shipped and then send it to [place].

## 2021-02-03 ENCOUNTER — Ambulatory Visit: Payer: BC Managed Care – PPO | Admitting: Neurology

## 2021-02-03 MED ORDER — AJOVY 225 MG/1.5ML ~~LOC~~ SOAJ: 225.0000 mg | SUBCUTANEOUS | 5 refills | Status: DC

## 2021-02-03 MED ORDER — ZEMBRACE SYMTOUCH 3 MG/0.5ML ~~LOC~~ SOAJ: 3.0000 mg | Freq: Once | SUBCUTANEOUS | 5 refills | Status: DC | PRN

## 2021-02-03 NOTE — Telephone Encounter (Signed)
Pt advised of new injection Ajovy.  Script sen to the pharmacy. As well Zymbrace sent [facility].

## 2021-02-15 ENCOUNTER — Encounter: Payer: Self-pay | Admitting: *Deleted

## 2021-02-23 NOTE — Progress Notes (Signed)
[place] Key: [id number] - PA Case ID: [id number] - Rx #: [id number] Need help? Call us at [phone] Status Sent to [facility] (fremanezumab-vfrm) injection 225MG /1.5ML auto-injectors Form [employer] PA Form [phone] NCPDP) Original Claim Info [id number] awaiting approval    Ajovy 225/1.76ml denied. Does not meet the criteria of plan. Denied. Patient needs to contact insurance and see what they will pay for.left message for patient to call insurance.

## 2021-02-27 NOTE — Progress Notes (Signed)
Per pt she is okay taking the Zymbrace 3mg  and ubrelvy for right now. Will call if she wants to do the Botox since Ajovy was denied.  Pt has a f/u with dr.Jaffe 05/05/21. Pt aware her PA for Botox is good until 10/22

## 2021-02-27 NOTE — Progress Notes (Signed)
Vernetta Honey (Key: [id number])  This request has received a denial. View the bottom of the request for an electronic copy of the denial letter with a list of denial reasons from the plan.  After reading the electronic denial letter, you may choose to complete an appeal. View the bottom of the request to see if an eAppeal is available.

## 2021-03-20 ENCOUNTER — Other Ambulatory Visit: Payer: Self-pay

## 2021-03-20 ENCOUNTER — Ambulatory Visit: Payer: BC Managed Care – PPO | Admitting: Family Medicine

## 2021-03-20 VITALS — BP 126/74 | HR 84 | Temp 98.1°F | Resp 16 | Ht 66.0 in | Wt 234.2 lb

## 2021-03-20 DIAGNOSIS — R42 Dizziness and giddiness: Secondary | ICD-10-CM | POA: Diagnosis not present

## 2021-03-20 DIAGNOSIS — R5383 Other fatigue: Secondary | ICD-10-CM

## 2021-03-20 DIAGNOSIS — Z862 Personal history of diseases of the blood and blood-forming organs and certain disorders involving the immune mechanism: Secondary | ICD-10-CM | POA: Diagnosis not present

## 2021-03-20 DIAGNOSIS — R7303 Prediabetes: Secondary | ICD-10-CM

## 2021-03-20 DIAGNOSIS — I10 Essential (primary) hypertension: Secondary | ICD-10-CM

## 2021-03-20 DIAGNOSIS — Z13220 Encounter for screening for lipoid disorders: Secondary | ICD-10-CM | POA: Diagnosis not present

## 2021-03-20 DIAGNOSIS — R0683 Snoring: Secondary | ICD-10-CM

## 2021-03-20 DIAGNOSIS — Z1329 Encounter for screening for other suspected endocrine disorder: Secondary | ICD-10-CM

## 2021-03-20 MED ORDER — AMLODIPINE BESYLATE 2.5 MG PO TABS: 2.5000 mg | ORAL_TABLET | Freq: Every day | ORAL | 1 refills | Status: DC

## 2021-03-20 MED ORDER — AMLODIPINE BESYLATE 5 MG PO TABS: 5.0000 mg | ORAL_TABLET | Freq: Every day | ORAL | 1 refills | Status: DC

## 2021-03-20 NOTE — Patient Instructions (Addendum)
See information below on hypertension.  I will refer you for sleep study evaluation as sleep apnea can sometimes cause some of your symptoms.  In office blood pressures are better than screening readings as well as your home readings.  If you persistently have readings above 130/80, okay to start the low-dose amlodipine once per day.  If any concerns on labs I will let you know.  You will most likely be able to see the results before I comment on them.  Recheck in the next 1 month, sooner if any new or worsening symptoms.  Please let me know if there are questions and thank you for coming in today.  Fatigue If you have fatigue, you feel tired all the time and have a lack of energy or a lack of motivation. Fatigue may make it difficult to start or complete tasks because of exhaustion. In general, occasional or mild fatigue is often a normal response to activity or life. However, long-lasting (chronic) or extreme fatigue may be a symptom of a medical condition. Follow these instructions at home: General instructions Watch your fatigue for any changes. Go to bed and get up at the same time every day. Avoid fatigue by pacing yourself during the day and getting enough sleep at night. Maintain a healthy weight. Medicines Take over-the-counter and prescription medicines only as told by your health care provider. Take a multivitamin, if told by your health care provider.  Do not use herbal or dietary supplements unless they are approved by your health care provider. Activity  Exercise regularly, as told by your health care provider. Use or practice techniques to help you relax, such as yoga, tai chi, meditation, or massage therapy.  Eating and drinking  Avoid heavy meals in the evening. Eat a well-balanced diet, which includes lean proteins, whole grains, plenty of fruits and vegetables, and low-fat dairy products. Avoid consuming too much caffeine. Avoid the use of alcohol. Drink enough fluid to  keep your urine pale yellow.  Lifestyle Change situations that cause you stress. Try to keep your work and personal schedule in balance. Do not use any products that contain nicotine or tobacco, such as cigarettes and e-cigarettes. If you need help quitting, ask your health care provider. Do not use drugs. Contact a health care provider if: Your fatigue does not get better. You have a fever. You suddenly lose or gain weight. You have headaches. You have trouble falling asleep or sleeping through the night. You feel angry, guilty, anxious, or sad. You are unable to have a bowel movement (constipation). Your skin is dry. You have swelling in your legs or another part of your body. Get help right away if: You feel confused. Your vision is blurry. You feel faint or you pass out. You have a severe headache. You have severe pain in your abdomen, your back, or the area between your waist and hips (pelvis). You have chest pain, shortness of breath, or an irregular or fast heartbeat. You are unable to urinate, or you urinate less than normal. You have abnormal bleeding, such as bleeding from the rectum, vagina, nose, lungs, or nipples. You vomit blood. You have thoughts about hurting yourself or others. If you ever feel like you may hurt yourself or others, or have thoughts about taking your own life, get help right away. You can go to your nearest emergency department or call: Your local emergency services (911 in the [place] A suicide crisis helpline, such as the [facility] at  (518)373-9404. This is open 24 hours a day. Summary If you have fatigue, you feel tired all the time and have a lack of energy or a lack of motivation. Fatigue may make it difficult to start or complete tasks because of exhaustion. Long-lasting (chronic) or extreme fatigue may be a symptom of a medical condition. Exercise regularly, as told by your health care provider. Change situations  that cause you stress. Try to keep your work and personal schedule in balance. This information is not intended to replace advice given to you by your health care provider. Make sure you discuss any questions you have with your healthcare provider. Document Revised: 06/16/2020 Document Reviewed: 06/16/2020 Elsevier Patient Education  2022 [place] Your Hypertension Hypertension, also called high blood pressure, is when the force of the blood pressing against the walls of the arteries is too strong. Arteries are blood vessels that carry blood from your heart throughout your body. Hypertension forces the heart to work harder to pump blood and may cause the arteries tobecome narrow or stiff. Understanding blood pressure readings Your personal target blood pressure may vary depending on your medical conditions, your age, and other factors. A blood pressure reading includes a higher number over a lower number. Ideally, your blood pressure should be below 120/80. You should know that: The first, or top, number is called the systolic pressure. It is a measure of the pressure in your arteries as your heart beats. The second, or bottom number, is called the diastolic pressure. It is a measure of the pressure in your arteries as the heart relaxes. Blood pressure is classified into four stages. Based on your blood pressure reading, your health care provider may use the following stages to determine what type of treatment you need, if any. Systolic pressure and diastolicpressure are measured in a unit called mmHg. Normal Systolic pressure: below 005. Diastolic pressure: below 80. Elevated Systolic pressure: 110-211. Diastolic pressure: below 80. Hypertension stage 1 Systolic pressure: 173-567. Diastolic pressure: 01-41. Hypertension stage 2 Systolic pressure: 030 or above. Diastolic pressure: 90 or above. How can this condition affect me? Managing your hypertension is an important  responsibility. Over time, hypertension can damage the arteries and decrease blood flow to important parts of the body, including the brain, heart, and kidneys. Having untreated or uncontrolled hypertension can lead to: A heart attack. A stroke. A weakened blood vessel (aneurysm). Heart failure. Kidney damage. Eye damage. Metabolic syndrome. Memory and concentration problems. Vascular dementia. What actions can I take to manage this condition? Hypertension can be managed by making lifestyle changes and possibly by taking medicines. Your health care provider will help you make a plan to bring yourblood pressure within a normal range. Nutrition  Eat a diet that is high in fiber and potassium, and low in salt (sodium), added sugar, and fat. An example eating plan is called the Dietary Approaches to Stop Hypertension (DASH) diet. To eat this way: Eat plenty of fresh fruits and vegetables. Try to fill one-half of your plate at each meal with fruits and vegetables. Eat whole grains, such as whole-wheat pasta, brown rice, or whole-grain bread. Fill about one-fourth of your plate with whole grains. Eat low-fat dairy products. Avoid fatty cuts of meat, processed or cured meats, and poultry with skin. Fill about one-fourth of your plate with lean proteins such as fish, chicken without skin, beans, eggs, and tofu. Avoid pre-made and processed foods. These tend to be higher in sodium, added sugar, and fat. Reduce your  daily sodium intake. Most people with hypertension should eat less than 1,500 mg of sodium a day.  Lifestyle  Work with your health care provider to maintain a healthy body weight or to lose weight. Ask what an ideal weight is for you. Get at least 30 minutes of exercise that causes your heart to beat faster (aerobic exercise) most days of the week. Activities may include walking, swimming, or biking. Include exercise to strengthen your muscles (resistance exercise), such as weight  lifting, as part of your weekly exercise routine. Try to do these types of exercises for 30 minutes at least 3 days a week. Do not use any products that contain nicotine or tobacco, such as cigarettes, e-cigarettes, and chewing tobacco. If you need help quitting, ask your health care provider. Control any long-term (chronic) conditions you have, such as high cholesterol or diabetes. Identify your sources of stress and find ways to manage stress. This may include meditation, deep breathing, or making time for fun activities.  Alcohol use Do not drink alcohol if: Your health care provider tells you not to drink. You are pregnant, may be pregnant, or are planning to become pregnant. If you drink alcohol: Limit how much you use to: 0-1 drink a day for women. 0-2 drinks a day for men. Be aware of how much alcohol is in your drink. In the [place] one drink equals one 12 oz bottle of beer (355 mL), one 5 oz glass of wine (148 mL), or one 1 oz glass of hard liquor (44 mL). Medicines Your health care provider may prescribe medicine if lifestyle changes are not enough to get your blood pressure under control and if: Your systolic blood pressure is 130 or higher. Your diastolic blood pressure is 80 or higher. Take medicines only as told by your health care provider. Follow the directions carefully. Blood pressure medicines must be taken as told by your health care provider. The medicine does not work as well when you skip doses. Skippingdoses also puts you at risk for problems. Monitoring Before you monitor your blood pressure: Do not smoke, drink caffeinated beverages, or exercise within 30 minutes before taking a measurement. Use the bathroom and empty your bladder (urinate). Sit quietly for at least 5 minutes before taking measurements. Monitor your blood pressure at home as told by your health care provider. To do this: Sit with your back straight and supported. Place your feet flat on the floor.  Do not cross your legs. Support your arm on a flat surface, such as a table. Make sure your upper arm is at heart level. Each time you measure, take two or three readings one minute apart and record the results. You may also need to have your blood pressure checked regularly by your healthcare provider. General information Talk with your health care provider about your diet, exercise habits, and other lifestyle factors that may be contributing to hypertension. Review all the medicines you take with your health care provider because there may be side effects or interactions. Keep all visits as told by your health care provider. Your health care provider can help you create and adjust your plan for managing your high blood pressure. Where to find more information National Heart, Lung, and Blood Institute: https://wilson-eaton.com/ American Heart Association: www.heart.org Contact a health care provider if: You think you are having a reaction to medicines you have taken. You have repeated (recurrent) headaches. You feel dizzy. You have swelling in your ankles. You have trouble with your vision.  Get help right away if: You develop a severe headache or confusion. You have unusual weakness or numbness, or you feel faint. You have severe pain in your chest or abdomen. You vomit repeatedly. You have trouble breathing. These symptoms may represent a serious problem that is an emergency. Do not wait to see if the symptoms will go away. Get medical help right away. Call your local emergency services (911 in the [place] Do not drive yourself to the hospital. Summary Hypertension is when the force of blood pumping through your arteries is too strong. If this condition is not controlled, it may put you at risk for serious complications. Your personal target blood pressure may vary depending on your medical conditions, your age, and other factors. For most people, a normal blood pressure is less than  120/80. Hypertension is managed by lifestyle changes, medicines, or both. Lifestyle changes to help manage hypertension include losing weight, eating a healthy, low-sodium diet, exercising more, stopping smoking, and limiting alcohol. This information is not intended to replace advice given to you by your health care provider. Make sure you discuss any questions you have with your healthcare provider. Document Revised: 09/11/2019 Document Reviewed: 07/07/2019 Elsevier Patient Education  2022 [employer].

## 2021-03-20 NOTE — Progress Notes (Signed)
Subjective:  Patient ID: Emma Wagner, female    DOB: 1971-01-03  Age: 50 y.o. MRN: [id number]  CC:  Chief Complaint  Patient presents with   Hypertension    Pt had health check at work was told 2 high BP readings, pt does have hxt migraines as well as random spells of dizziness no associated with specific activity or movement.     HPI Emma Wagner presents for   Elevated BP: Elevated readings on recent work health screening. 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  147/98, 144/88 range.  Home readings - 140's on left wrist, 120 on R side.  No current antihypertensives, but has had some slight elevated readings in the past. History of migraines with episodes of dizziness previously, treated by neurology, Dr. Tomi Likens Most recent labs in 2020, but normal TSH and BMP at that time.  A1c at prediabetes range.  5.9 in April 2020.  Hemoglobin normal with normal CBC in April 2020. Has had anemia in the past. On iron prior - stopped for awhile now as made nauseous.   Has gained some weight, but up and down with weight. father passed away 1.5 yrs ago. Covid infection.  Hx of significant GERD, stable now.  Tired all the time.  More mild headaches, less migraines.  Dizzy comes and goes, fleeting symptoms. No syncope/near-syncope. No melena/hematochezia.  No CP/dyspnea.  Tylenol pm or Zquil to help sleep.  Woke up gasping for breath last night - in the middle of a dream. Few similar episodes in past.  Does snore (less when lower weight). Daytime somnolence. Nighttime wakening.  FH of CKD and heart failure - father.   Wt Readings from Last 3 Encounters:  03/20/21 234 lb 3.2 oz (106.2 kg)  10/21/20 220 lb (99.8 kg)  08/03/20 233 lb (105.7 kg)    BP Readings from Last 3 Encounters:  03/20/21 126/74  08/03/20 (!) 144/100  06/16/20 (!) 134/98    Followed by psychiatry -Dr. Toy Care. Off one of her meds - plans to call psychiatry to fill.  Depression screen [facility] 2/9 03/20/2021 03/20/2021 05/10/2020  10/20/2019 09/18/2019  Decreased Interest 0 0 0 3 2  Down, Depressed, Hopeless 3 0 0 3 2  PHQ - 2 Score 3 0 0 6 4  Altered sleeping 3 - 0 2 0  Tired, decreased energy 0 - 0 2 0  Change in appetite 3 - 0 3 3  Feeling bad or failure about yourself  0 - 0 0 0  Trouble concentrating 0 - 0 1 2  Moving slowly or fidgety/restless 0 - 0 0 0  Suicidal thoughts 0 - 0 0 0  PHQ-9 Score 9 - 0 14 9  Difficult doing work/chores - - Not difficult at all Somewhat difficult Somewhat difficult     History Patient Active Problem List   Diagnosis Date Noted   Chronic constipation 03/04/2019   Migraines 11/21/2018   Postoperative anemia due to acute blood loss 06/16/2016   Primary osteoarthritis of left hip 10/31/2015   Primary osteoarthritis of right hip 10/30/2015   Pain of right upper arm 05/12/2015   Hair loss 05/02/2012   Prediabetes 02/20/2012   Thrombosed external hemorrhoid 02/08/2012   Post-nasal drip 11/27/2011   Anxiety and depression 11/27/2011   Arthritis of right hip 11/27/2011   General medical examination 10/29/2011   Cough 09/17/2011   Obesity (BMI 30-39.9) 09/17/2011   Tobacco abuse 08/03/2011   X-ray of lung, abnormal 06/11/2007   GERD 06/11/2007  Past Medical History:  Diagnosis Date   Anxiety    add also   Arthritis    Borderline type 2 diabetes mellitus since 2013   -lost weight and quit smoking and is no longer PRE diabetic   Bursitis    bilateral hips, more on right hip   Chronic kidney disease    h/o kidney stone 2016   Constipation    Depression    Frequent urination    while kidney stone evident   GERD (gastroesophageal reflux disease)    Headache    "every blue moon. not often"   Hemorrhoids    History of kidney stones    Pneumonia    PONV (postoperative nausea and vomiting)    nausea with appendectomy and with hip replacement in March 2017   Past Surgical History:  Procedure Laterality Date   APPENDECTOMY  1985   CESAREAN SECTION  2009   TOTAL  HIP ARTHROPLASTY Left 10/31/2015   Procedure: TOTAL HIP ARTHROPLASTY ANTERIOR APPROACH;  Surgeon: Frederik Pear, MD;  Location: [place];  Service: Orthopedics;  Laterality: Left;   TOTAL HIP ARTHROPLASTY Right 06/13/2016   Procedure: TOTAL HIP ARTHROPLASTY ANTERIOR APPROACH;  Surgeon: Frederik Pear, MD;  Location: [place];  Service: Orthopedics;  Laterality: Right;   TUBAL LIGATION  2009   wisdom teth     No Known Allergies Prior to Admission medications   Medication Sig Start Date End Date Taking? Authorizing Provider  buPROPion (WELLBUTRIN XL) 150 MG 24 hr tablet Take 150 mg by mouth every morning. 03/21/18  Yes [provider]  buPROPion (WELLBUTRIN XL) 300 MG 24 hr tablet Take 300 mg by mouth daily with breakfast. 05/22/16  Yes [provider]  diphenhydramine-acetaminophen (TYLENOL PM) 25-500 MG TABS tablet Take 1-2 tablets by mouth at bedtime.   Yes [provider]  famotidine (PEPCID) 40 MG tablet TAKE 1 TABLET BY MOUTH EVERYDAY AT BEDTIME 09/12/20  Yes Midge Minium, MD  FLUoxetine (PROZAC) 40 MG capsule Take 40 mg by mouth daily with breakfast. 05/22/16  Yes [provider]  ibuprofen (ADVIL,MOTRIN) 800 MG tablet ibuprofen 800 mg tablet  TAKE 1 TABLET 3 TIMES A DAY AS NEEDED FOR PAIN   Yes [provider]  pantoprazole (PROTONIX) 40 MG tablet Take 1 tablet 20 to 30 minutes before breakfast and 1 tablet 20 to 30 minutes before dinner meal.\ 08/03/20  Yes Milus Banister, MD  SUMAtriptan Succinate (ZEMBRACE SYMTOUCH) 3 MG/0.5ML SOAJ 1 injection into skin.  May repeat in 1 hour.  Maximum 2 injections in 24 hours. Patient taking differently: 1 injection into skin.  May repeat in 1 hour.  Maximum 2 injections in 24 hours. 04/29/20  Yes Tomi Likens, Adam R, DO  SUMAtriptan Succinate (ZEMBRACE SYMTOUCH) 3 MG/0.5ML SOAJ Inject 3 mg into the skin Once PRN for up to 1 dose (may repeat in one hour no more then 2 injection in 24 hours). 02/03/21  Yes Jaffe, Adam R, DO   UBRELVY 100 MG TABS TAKE 1 TABLET BY MOUTH AS NEEDED MAY REPEAT IN 2 HOURS IF NEEDED MAX 2 TABLETS IN 24 HOURS 01/02/21  Yes Jaffe, Adam R, DO  famotidine (PEPCID) 20 MG tablet Take 1 tablet (20 mg total) by mouth at bedtime. Patient not taking: Reported on 03/20/2021 08/03/20   Milus Banister, MD  Fremanezumab-vfrm (AJOVY) 225 MG/1.5ML SOAJ Inject 225 mg into the skin every 28 (twenty-eight) days. Patient not taking: Reported on 03/20/2021 02/03/21   Pieter Partridge, DO  ondansetron (ZOFRAN-ODT) 4 MG disintegrating tablet TAKE 1 TABLET BY MOUTH EVERY 8 HOURS AS NEEDED FOR NAUSEA AND VOMITING Patient not taking: Reported on 03/20/2021 07/13/19   Midge Minium, MD  Rimegepant Sulfate (NURTEC) 75 MG TBDP Take 75 mg by mouth every other day. Patient not taking: Reported on 03/20/2021 04/15/20   Pieter Partridge, DO  sucralfate (CARAFATE) 1 g tablet TAKE 1 TABLET (1 G TOTAL) BY MOUTH 4 (FOUR) TIMES DAILY - WITH MEALS AND AT BEDTIME. Patient not taking: Reported on 03/20/2021 05/30/20   Midge Minium, MD   Social History   Socioeconomic History   Marital status: Single    Spouse name: Not on file   Number of children: 2   Years of education: 16   Highest education level: 12th grade  Occupational History   Occupation: unemployed  Tobacco Use   Smoking status: Former    Packs/day: 1.00    Years: 26.00    Pack years: 26.00    Types: Cigarettes    Quit date: 07/20/2012    Years since quitting: 8.6   Smokeless tobacco: Never  Vaping Use   Vaping Use: Never used  Substance and Sexual Activity   Alcohol use: Yes    Comment: occasional   Drug use: No   Sexual activity: Yes    Birth control/protection: None  Other Topics Concern   Not on file  Social History Narrative   Patient is right-handed. She lives in a 2 level home. Drinks one cup of coffee a day. She walks daily.   Social Determinants of Health   Financial Resource Strain: Not on file  Food Insecurity: Not on file   Transportation Needs: Not on file  Physical Activity: Not on file  Stress: Not on file  Social Connections: Not on file  Intimate Partner Violence: Not on file    Review of Systems Per HPI  Objective:   Vitals:   03/20/21 1414 03/20/21 1507  BP: 132/76 126/74  Pulse: 84   Resp: 16   Temp: 98.1 F (36.7 C)   TempSrc: Temporal   SpO2: 97%   Weight: 234 lb 3.2 oz (106.2 kg)   Height: $Remove'5\' 6"'bBCNgET$  (1.676 m)      Physical Exam Vitals reviewed.  Constitutional:      Appearance: Normal appearance. She is well-developed.  HENT:     Head: Normocephalic and atraumatic.  Eyes:     Conjunctiva/sclera: Conjunctivae normal.     Pupils: Pupils are equal, round, and reactive to light.  Neck:     Vascular: No carotid bruit.  Cardiovascular:     Rate and Rhythm: Normal rate and regular rhythm.     Heart sounds: Normal heart sounds. No murmur heard. Pulmonary:     Effort: Pulmonary effort is normal.     Breath sounds: Normal breath sounds.  Abdominal:     Palpations: Abdomen is soft. There is no pulsatile mass.     Tenderness: There is no abdominal tenderness.  Musculoskeletal:     Right lower leg: No edema.     Left lower leg: No edema.  Skin:    General: Skin is warm and dry.  Neurological:     Mental Status: She is alert and oriented to person, place, and time.  Psychiatric:        Mood and Affect: Mood normal.        Behavior: Behavior normal.   EKG: Sinus rhythm, rate 73, no acute findings, no significant changes compared  to 06/16/20  Assessment & Plan:  Emma Wagner is a 50 y.o. female . Hypertension, unspecified type - Plan: Comprehensive metabolic panel, Lipid panel, TSH, CBC, Ambulatory referral to Sleep Studies, Hemoglobin A1c, EKG 12-Lead, amLODipine (NORVASC) 2.5 MG tablet, DISCONTINUED: amLODipine (NORVASC) 5 MG tablet  -Improved on recheck.  May have underlying hypertension, home monitoring discussed with option of low-dose amlodipine if persistent elevated  readings.  Potential side effects, risks discussed.  Close follow-up.  Refer for sleep apnea testing as well below.  Prediabetes - Plan: Hemoglobin A1c  -Check A1c, likely lifestyle modification as initial approach  Snoring - Plan: Ambulatory referral to Sleep Studies Fatigue, unspecified type - Plan: Comprehensive metabolic panel, TSH, CBC, Ambulatory referral to Sleep Studies  -Suspicious for obstructive sleep apnea.  Check other labs for fatigue, no concerning findings on EKG, refer to sleep specialist.  Episode of dizziness - Plan: EKG 12-Lead  -Fleeting episodes, check CBC as above, EKG reassuring.  RTC/ER precautions if recurs.  Screening for thyroid disorder - Plan: TSH  Screening for hyperlipidemia - Plan: Lipid panel  History of anemia - Plan: CBC   Meds ordered this encounter  Medications   DISCONTD: amLODipine (NORVASC) 5 MG tablet    Sig: Take 1 tablet (5 mg total) by mouth daily.    Dispense:  30 tablet    Refill:  1   amLODipine (NORVASC) 2.5 MG tablet    Sig: Take 1 tablet (2.5 mg total) by mouth daily.    Dispense:  30 tablet    Refill:  1    Disregard $RemoveBe'5mg'WSSxkaQKS$  dose- fill 2.$RemoveBefo'5mg'EZtShNSaPCd$  qd.    Patient Instructions  See information below on hypertension.  I will refer you for sleep study evaluation as sleep apnea can sometimes cause some of your symptoms.  In office blood pressures are better than screening readings as well as your home readings.  If you persistently have readings above 130/80, okay to start the low-dose amlodipine once per day.  If any concerns on labs I will let you know.  You will most likely be able to see the results before I comment on them.  Recheck in the next 1 month, sooner if any new or worsening symptoms.  Please let me know if there are questions and thank you for coming in today.  Fatigue If you have fatigue, you feel tired all the time and have a lack of energy or a lack of motivation. Fatigue may make it difficult to start or complete tasks  because of exhaustion. In general, occasional or mild fatigue is often a normal response to activity or life. However, long-lasting (chronic) or extreme fatigue may be a symptom of a medical condition. Follow these instructions at home: General instructions Watch your fatigue for any changes. Go to bed and get up at the same time every day. Avoid fatigue by pacing yourself during the day and getting enough sleep at night. Maintain a healthy weight. Medicines Take over-the-counter and prescription medicines only as told by your health care provider. Take a multivitamin, if told by your health care provider.  Do not use herbal or dietary supplements unless they are approved by your health care provider. Activity  Exercise regularly, as told by your health care provider. Use or practice techniques to help you relax, such as yoga, tai chi, meditation, or massage therapy.  Eating and drinking  Avoid heavy meals in the evening. Eat a well-balanced diet, which includes lean proteins, whole grains, plenty of fruits and  vegetables, and low-fat dairy products. Avoid consuming too much caffeine. Avoid the use of alcohol. Drink enough fluid to keep your urine pale yellow.  Lifestyle Change situations that cause you stress. Try to keep your work and personal schedule in balance. Do not use any products that contain nicotine or tobacco, such as cigarettes and e-cigarettes. If you need help quitting, ask your health care provider. Do not use drugs. Contact a health care provider if: Your fatigue does not get better. You have a fever. You suddenly lose or gain weight. You have headaches. You have trouble falling asleep or sleeping through the night. You feel angry, guilty, anxious, or sad. You are unable to have a bowel movement (constipation). Your skin is dry. You have swelling in your legs or another part of your body. Get help right away if: You feel confused. Your vision is blurry. You  feel faint or you pass out. You have a severe headache. You have severe pain in your abdomen, your back, or the area between your waist and hips (pelvis). You have chest pain, shortness of breath, or an irregular or fast heartbeat. You are unable to urinate, or you urinate less than normal. You have abnormal bleeding, such as bleeding from the rectum, vagina, nose, lungs, or nipples. You vomit blood. You have thoughts about hurting yourself or others. If you ever feel like you may hurt yourself or others, or have thoughts about taking your own life, get help right away. You can go to your nearest emergency department or call: Your local emergency services (911 in the [place] A suicide crisis helpline, such as the [facility] at 318-135-5095. This is open 24 hours a day. Summary If you have fatigue, you feel tired all the time and have a lack of energy or a lack of motivation. Fatigue may make it difficult to start or complete tasks because of exhaustion. Long-lasting (chronic) or extreme fatigue may be a symptom of a medical condition. Exercise regularly, as told by your health care provider. Change situations that cause you stress. Try to keep your work and personal schedule in balance. This information is not intended to replace advice given to you by your health care provider. Make sure you discuss any questions you have with your healthcare provider. Document Revised: 06/16/2020 Document Reviewed: 06/16/2020 Elsevier Patient Education  2022 [place] Your Hypertension Hypertension, also called high blood pressure, is when the force of the blood pressing against the walls of the arteries is too strong. Arteries are blood vessels that carry blood from your heart throughout your body. Hypertension forces the heart to work harder to pump blood and may cause the arteries tobecome narrow or stiff. Understanding blood pressure readings Your personal target  blood pressure may vary depending on your medical conditions, your age, and other factors. A blood pressure reading includes a higher number over a lower number. Ideally, your blood pressure should be below 120/80. You should know that: The first, or top, number is called the systolic pressure. It is a measure of the pressure in your arteries as your heart beats. The second, or bottom number, is called the diastolic pressure. It is a measure of the pressure in your arteries as the heart relaxes. Blood pressure is classified into four stages. Based on your blood pressure reading, your health care provider may use the following stages to determine what type of treatment you need, if any. Systolic pressure and diastolicpressure are measured in a unit called  mmHg. Normal Systolic pressure: below 226. Diastolic pressure: below 80. Elevated Systolic pressure: 333-545. Diastolic pressure: below 80. Hypertension stage 1 Systolic pressure: 625-638. Diastolic pressure: 93-73. Hypertension stage 2 Systolic pressure: 428 or above. Diastolic pressure: 90 or above. How can this condition affect me? Managing your hypertension is an important responsibility. Over time, hypertension can damage the arteries and decrease blood flow to important parts of the body, including the brain, heart, and kidneys. Having untreated or uncontrolled hypertension can lead to: A heart attack. A stroke. A weakened blood vessel (aneurysm). Heart failure. Kidney damage. Eye damage. Metabolic syndrome. Memory and concentration problems. Vascular dementia. What actions can I take to manage this condition? Hypertension can be managed by making lifestyle changes and possibly by taking medicines. Your health care provider will help you make a plan to bring yourblood pressure within a normal range. Nutrition  Eat a diet that is high in fiber and potassium, and low in salt (sodium), added sugar, and fat. An example eating plan is  called the Dietary Approaches to Stop Hypertension (DASH) diet. To eat this way: Eat plenty of fresh fruits and vegetables. Try to fill one-half of your plate at each meal with fruits and vegetables. Eat whole grains, such as whole-wheat pasta, brown rice, or whole-grain bread. Fill about one-fourth of your plate with whole grains. Eat low-fat dairy products. Avoid fatty cuts of meat, processed or cured meats, and poultry with skin. Fill about one-fourth of your plate with lean proteins such as fish, chicken without skin, beans, eggs, and tofu. Avoid pre-made and processed foods. These tend to be higher in sodium, added sugar, and fat. Reduce your daily sodium intake. Most people with hypertension should eat less than 1,500 mg of sodium a day.  Lifestyle  Work with your health care provider to maintain a healthy body weight or to lose weight. Ask what an ideal weight is for you. Get at least 30 minutes of exercise that causes your heart to beat faster (aerobic exercise) most days of the week. Activities may include walking, swimming, or biking. Include exercise to strengthen your muscles (resistance exercise), such as weight lifting, as part of your weekly exercise routine. Try to do these types of exercises for 30 minutes at least 3 days a week. Do not use any products that contain nicotine or tobacco, such as cigarettes, e-cigarettes, and chewing tobacco. If you need help quitting, ask your health care provider. Control any long-term (chronic) conditions you have, such as high cholesterol or diabetes. Identify your sources of stress and find ways to manage stress. This may include meditation, deep breathing, or making time for fun activities.  Alcohol use Do not drink alcohol if: Your health care provider tells you not to drink. You are pregnant, may be pregnant, or are planning to become pregnant. If you drink alcohol: Limit how much you use to: 0-1 drink a day for women. 0-2 drinks a day  for men. Be aware of how much alcohol is in your drink. In the [place] one drink equals one 12 oz bottle of beer (355 mL), one 5 oz glass of wine (148 mL), or one 1 oz glass of hard liquor (44 mL). Medicines Your health care provider may prescribe medicine if lifestyle changes are not enough to get your blood pressure under control and if: Your systolic blood pressure is 130 or higher. Your diastolic blood pressure is 80 or higher. Take medicines only as told by your health care provider. Follow the directions  carefully. Blood pressure medicines must be taken as told by your health care provider. The medicine does not work as well when you skip doses. Skippingdoses also puts you at risk for problems. Monitoring Before you monitor your blood pressure: Do not smoke, drink caffeinated beverages, or exercise within 30 minutes before taking a measurement. Use the bathroom and empty your bladder (urinate). Sit quietly for at least 5 minutes before taking measurements. Monitor your blood pressure at home as told by your health care provider. To do this: Sit with your back straight and supported. Place your feet flat on the floor. Do not cross your legs. Support your arm on a flat surface, such as a table. Make sure your upper arm is at heart level. Each time you measure, take two or three readings one minute apart and record the results. You may also need to have your blood pressure checked regularly by your healthcare provider. General information Talk with your health care provider about your diet, exercise habits, and other lifestyle factors that may be contributing to hypertension. Review all the medicines you take with your health care provider because there may be side effects or interactions. Keep all visits as told by your health care provider. Your health care provider can help you create and adjust your plan for managing your high blood pressure. Where to find more information National  Heart, Lung, and Blood Institute: https://wilson-eaton.com/ American Heart Association: www.heart.org Contact a health care provider if: You think you are having a reaction to medicines you have taken. You have repeated (recurrent) headaches. You feel dizzy. You have swelling in your ankles. You have trouble with your vision. Get help right away if: You develop a severe headache or confusion. You have unusual weakness or numbness, or you feel faint. You have severe pain in your chest or abdomen. You vomit repeatedly. You have trouble breathing. These symptoms may represent a serious problem that is an emergency. Do not wait to see if the symptoms will go away. Get medical help right away. Call your local emergency services (911 in the [place] Do not drive yourself to the hospital. Summary Hypertension is when the force of blood pumping through your arteries is too strong. If this condition is not controlled, it may put you at risk for serious complications. Your personal target blood pressure may vary depending on your medical conditions, your age, and other factors. For most people, a normal blood pressure is less than 120/80. Hypertension is managed by lifestyle changes, medicines, or both. Lifestyle changes to help manage hypertension include losing weight, eating a healthy, low-sodium diet, exercising more, stopping smoking, and limiting alcohol. This information is not intended to replace advice given to you by your health care provider. Make sure you discuss any questions you have with your healthcare provider. Document Revised: 09/11/2019 Document Reviewed: 07/07/2019 Elsevier Patient Education  2022 [place],   Merri Ray, MD [facility] 03/21/21 9:21 AM

## 2021-03-21 ENCOUNTER — Encounter: Payer: Self-pay | Admitting: Family Medicine

## 2021-03-21 LAB — CBC
HCT: 34 % — ABNORMAL LOW (ref 36.0–46.0)
Hemoglobin: 10.5 g/dL — ABNORMAL LOW (ref 12.0–15.0)
MCHC: 30.9 g/dL (ref 30.0–36.0)
MCV: 76.6 fl — ABNORMAL LOW (ref 78.0–100.0)
Platelets: 347 10*3/uL (ref 150.0–400.0)
RBC: 4.44 Mil/uL (ref 3.87–5.11)
RDW: 15.8 % — ABNORMAL HIGH (ref 11.5–15.5)
WBC: 8.8 10*3/uL (ref 4.0–10.5)

## 2021-03-21 LAB — COMPREHENSIVE METABOLIC PANEL
ALT: 13 U/L (ref 0–35)
AST: 15 U/L (ref 0–37)
Albumin: 4.2 g/dL (ref 3.5–5.2)
Alkaline Phosphatase: 57 U/L (ref 39–117)
BUN: 16 mg/dL (ref 6–23)
CO2: 26 mEq/L (ref 19–32)
Calcium: 9.3 mg/dL (ref 8.4–10.5)
Chloride: 104 mEq/L (ref 96–112)
Creatinine, Ser: 1.01 mg/dL (ref 0.40–1.20)
GFR: 65.01 mL/min (ref >60.00)
Glucose, Bld: 79 mg/dL (ref 70–99)
Potassium: 4 mEq/L (ref 3.5–5.1)
Sodium: 140 mEq/L (ref 135–145)
Total Bilirubin: 0.3 mg/dL (ref 0.2–1.2)
Total Protein: 7.3 g/dL (ref 6.0–8.3)

## 2021-03-21 LAB — LIPID PANEL
Cholesterol: 173 mg/dL (ref 0–200)
HDL: 49.6 mg/dL (ref >39.00)
LDL Cholesterol: 101 mg/dL — ABNORMAL HIGH (ref 0–99)
NonHDL: 123.32
Total CHOL/HDL Ratio: 3
Triglycerides: 110 mg/dL (ref 0.0–149.0)
VLDL: 22 mg/dL (ref 0.0–40.0)

## 2021-03-21 LAB — TSH: TSH: 2.1 u[IU]/mL (ref 0.35–5.50)

## 2021-03-21 LAB — HEMOGLOBIN A1C: Hgb A1c MFr Bld: 6.5 % (ref 4.6–6.5)

## 2021-04-18 ENCOUNTER — Other Ambulatory Visit: Payer: Self-pay | Admitting: Family Medicine

## 2021-04-18 DIAGNOSIS — I10 Essential (primary) hypertension: Secondary | ICD-10-CM

## 2021-05-03 ENCOUNTER — Other Ambulatory Visit: Payer: Self-pay | Admitting: Family Medicine

## 2021-05-03 ENCOUNTER — Other Ambulatory Visit: Payer: Self-pay | Admitting: Neurology

## 2021-05-05 ENCOUNTER — Ambulatory Visit: Payer: BC Managed Care – PPO | Admitting: Neurology

## 2021-05-08 ENCOUNTER — Telehealth: Payer: Self-pay

## 2021-05-08 NOTE — Telephone Encounter (Signed)
New message   KAEDA GOLDBLUM Key: [id number] - PA Case ID: [id number] - Rx #: [id number] Need help? Call us at [phone] Status Sent to [facility] (fremanezumab-vfrm) injection '225MG'$ /1.5ML auto-injectors Form Charity fundraiser PA Form [phone] NCPDP) Original Claim Info [id number] MUST MEET STEP, [place] REQR [phone] REQUIRES PRIOR AUTHORIZATION(PHARMACY HELP DESK [phone])

## 2021-05-09 NOTE — Telephone Encounter (Signed)
F/u   Outcome Approved today  Your PA request has been approved. Additional information will be provided in the approval communication. (Message 1145)  Drug AJOVY (fremanezumab-vfrm) injection 225MG /1.5ML auto-injectors  Form Charity fundraiser PA Form [phone] NCPDP) Original Claim Info [phone] MUST MEET STEP, [place] REQR [phone] REQUIRES PRIOR AUTHORIZATION(PHARMACY HELP DESK [phone])

## 2021-05-10 ENCOUNTER — Encounter: Payer: Self-pay | Admitting: Family Medicine

## 2021-05-10 ENCOUNTER — Other Ambulatory Visit: Payer: Self-pay

## 2021-05-10 ENCOUNTER — Ambulatory Visit: Payer: BC Managed Care – PPO | Admitting: Family Medicine

## 2021-05-10 VITALS — BP 130/80 | HR 81 | Temp 98.1°F | Resp 16 | Ht 66.0 in | Wt 233.2 lb

## 2021-05-10 DIAGNOSIS — R7303 Prediabetes: Secondary | ICD-10-CM

## 2021-05-10 DIAGNOSIS — E669 Obesity, unspecified: Secondary | ICD-10-CM

## 2021-05-10 DIAGNOSIS — Z23 Encounter for immunization: Secondary | ICD-10-CM | POA: Diagnosis not present

## 2021-05-10 DIAGNOSIS — I10 Essential (primary) hypertension: Secondary | ICD-10-CM | POA: Diagnosis not present

## 2021-05-10 MED ORDER — OZEMPIC (0.25 OR 0.5 MG/DOSE) 2 MG/1.5ML ~~LOC~~ SOPN: 0.2500 mg | PEN_INJECTOR | SUBCUTANEOUS | 1 refills | Status: DC

## 2021-05-10 NOTE — Progress Notes (Signed)
   Subjective:    Patient ID: Emma Wagner, female    DOB: 10/20/1970, 50 y.o.   MRN: [id number]  HPI HTN- pt saw Dr Carlota Raspberry last month after BPs were elevated on a health screen.  She was started on Amlodipine 2.5mg  daily.  Pt reports she will occasionally check BP when she feels 'weird'- highest reading was 146/90.  Pt reports episodes of 'feeling weird' are fewer and farther between.  Has sleep study scheduled.  No CP, SOB, HAs, edema.  + visual changes- due for eye exam, pt is supposed to wear glasses but she doesn't.  Obesity- pt reports she is very active, no formal exercise.  Since father has passed, she has had emotional eating.  Prediabetes- A1C last month was 6.5.  She is interested in starting medication- particularly one that would help her lose weight.   Review of Systems For ROS see HPI   This visit occurred during the SARS-CoV-2 public health emergency.  Safety protocols were in place, including screening questions prior to the visit, additional usage of staff PPE, and extensive cleaning of exam room while observing appropriate contact time as indicated for disinfecting solutions.      Objective:   Physical Exam Vitals reviewed.  Constitutional:      General: She is not in acute distress.    Appearance: Normal appearance. She is well-developed. She is obese. She is not ill-appearing.  HENT:     Head: Normocephalic and atraumatic.  Eyes:     Conjunctiva/sclera: Conjunctivae normal.     Pupils: Pupils are equal, round, and reactive to light.  Neck:     Thyroid: No thyromegaly.  Cardiovascular:     Rate and Rhythm: Normal rate and regular rhythm.     Pulses: Normal pulses.     Heart sounds: Normal heart sounds. No murmur heard. Pulmonary:     Effort: Pulmonary effort is normal. No respiratory distress.     Breath sounds: Normal breath sounds.  Abdominal:     General: There is no distension.     Palpations: Abdomen is soft.     Tenderness: There is no abdominal  tenderness.  Musculoskeletal:     Cervical back: Normal range of motion and neck supple.     Right lower leg: No edema.     Left lower leg: No edema.  Lymphadenopathy:     Cervical: No cervical adenopathy.  Skin:    General: Skin is warm and dry.  Neurological:     Mental Status: She is alert and oriented to person, place, and time.  Psychiatric:        Behavior: Behavior normal.          Assessment & Plan:

## 2021-05-10 NOTE — Patient Instructions (Signed)
Follow up in 6 weeks to recheck weight loss progress and sugars (we'll repeat A1C) No need for labs today!  Emma Wagner! Start the Ozempic 0.25mg  weekly x4 weeks and then increase to the 0.5mg  weekly dose Continue to work on healthy diet and regular exercise- you can do it! Schedule an eye exam at your convenience and have them send me a copy of the report Call with any questions or concerns Happy Fall!!!

## 2021-05-11 NOTE — Assessment & Plan Note (Signed)
Ongoing issue for pt.  She is very active at work but doesn't get any formal exercise.  She has struggled w/ emotional eating since the death of her father.  She is interested in starting Ozempic for both weight loss and since she is on the cusp of diabetes.  Prescription sent along w/ instructions for use and possible side effects.  Will follow.

## 2021-05-11 NOTE — Assessment & Plan Note (Signed)
New to provider.  Pt reports tolerating Amlodipine w/o difficulty and that home BPs have been better since starting medication.  No med changes at this time.  Will follow.

## 2021-05-11 NOTE — Assessment & Plan Note (Signed)
A1C last month was 6.5  Pt is interested in medication that will both control sugar and help w/ weight loss.  Will start Ozempic.  Encouraged her to schedule an eye exam.  Will follow.

## 2021-05-29 ENCOUNTER — Other Ambulatory Visit: Payer: Self-pay | Admitting: Family Medicine

## 2021-05-29 DIAGNOSIS — I10 Essential (primary) hypertension: Secondary | ICD-10-CM

## 2021-05-30 ENCOUNTER — Encounter: Payer: Self-pay | Admitting: Neurology

## 2021-05-30 ENCOUNTER — Ambulatory Visit: Payer: BC Managed Care – PPO | Admitting: Neurology

## 2021-05-30 VITALS — BP 135/92 | HR 80 | Ht 66.5 in | Wt 232.0 lb

## 2021-05-30 DIAGNOSIS — R0683 Snoring: Secondary | ICD-10-CM

## 2021-05-30 DIAGNOSIS — G4719 Other hypersomnia: Secondary | ICD-10-CM

## 2021-05-30 DIAGNOSIS — R0689 Other abnormalities of breathing: Secondary | ICD-10-CM

## 2021-05-30 DIAGNOSIS — R519 Headache, unspecified: Secondary | ICD-10-CM

## 2021-05-30 DIAGNOSIS — R351 Nocturia: Secondary | ICD-10-CM

## 2021-05-30 DIAGNOSIS — E669 Obesity, unspecified: Secondary | ICD-10-CM

## 2021-05-30 NOTE — Patient Instructions (Signed)

## 2021-05-30 NOTE — Progress Notes (Signed)
Subjective:    Patient ID: Emma Wagner is a 50 y.o. female.  HPI    Star Age, MD, PhD [facility] [address], [address] [address] [place],  [address] Dear Dr. Carlota Raspberry,   I saw your patient, Emma Wagner, upon your kind request, in my Sleep clinic today for initial consultation of her sleep disorder, in particular, concern for underlying obstructive sleep apnea.  The patient is unaccompanied today.  As you know, Emma Wagner is a 50 year old right-handed woman with an underlying medical history of reflux disease, migraine headaches and dizziness (followed by Dr. Tomi Likens at [facility] neurolog[facility]), anxiety, depression, borderline diabetes, history of kidney stones, arthritis, and obesity, who reports snoring and excessive daytime somnolence as well as morning headaches.  I reviewed your office note from 03/20/2021.  Her Epworth sleepiness score is 9 out of 24, fatigue severity score is 46 out of 63. She has occasionally woken up with a sense of gasping for air.  Her snoring has fluctuated with her weight.  She has had trouble losing weight.  She has recently started Ozempic.  She lives with her "husband"/partner of 23 years.  She has a 57 year old daughter and also a 71 year old daughter who is on her own.  They have 3 dogs in the household.  She does have a TV in her bedroom but does not care for it at night, prefers a dark and quiet in her bedroom.  She goes to bed generally between 9 and 10 PM and rise time is around 7 AM.  She has difficulty initiating and maintaining sleep.  Her sister and mom have insomnia.  No obvious family history of obstructive sleep apnea.  The patient has taken over-the-counter medication such as ZzzQuil or Tylenol PM at night for sleep.  She has tried melatonin which did not help.  She drinks caffeine in the form of coffee, currently 1 cup/day.  She does not drink alcohol regularly, occasionally.  She quit smoking in 2013.  She currently works as a  Therapist, occupational at [employer].  She previously worked for [employer] for 17 years.  She has a history of morning headaches.  She has nocturia about once per average night.  Her Past Medical History Is Significant For: Past Medical History:  Diagnosis Date   Anxiety    add also   Arthritis    Borderline type 2 diabetes mellitus since 2013   -lost weight and quit smoking and is no longer PRE diabetic   Bursitis    bilateral hips, more on right hip   Chronic kidney disease    h/o kidney stone 2016   Constipation    Depression    Frequent urination    while kidney stone evident   GERD (gastroesophageal reflux disease)    Headache    "every blue moon. 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   Procedure: TOTAL HIP ARTHROPLASTY ANTERIOR APPROACH;  Surgeon: Frederik Pear, MD;  Location: [facility];  Service: Orthopedics;  Laterality: Left;   TOTAL HIP ARTHROPLASTY Right 06/13/2016   Procedure: TOTAL HIP ARTHROPLASTY ANTERIOR APPROACH;  Surgeon: Frederik Pear, MD;  Location: [facility];  Service: Orthopedics;  Laterality: Right;   TUBAL LIGATION  2009   wisdom teth      Her Family History Is Significant For: Family History  Problem Relation Age of Onset   Arthritis Mother    Hypertension Mother    Prostate cancer Father    Skin cancer Father    Congestive Heart Failure Father    Hypertension Father    Snoring Father    Heart failure Other    Hypertension Other    Cancer Other    Anxiety disorder Other    Depression Other    Insomnia Other     Her Social History Is Significant For: Social History   Socioeconomic History   Marital  status: Single    Spouse name: Not on file   Number of children: 2   Years of education: 12   Highest education level: 12th grade  Occupational History   Occupation: unemployed  Tobacco Use   Smoking status: Former    Packs/day: 1.00    Years: 26.00    Pack years: 26.00    Types: Cigarettes    Quit date: 07/20/2012    Years since quitting: 8.8   Smokeless tobacco: Never  Vaping Use   Vaping Use: Never used  Substance and Sexual Activity   Alcohol use: Yes    Comment: occasional   Drug use: No   Sexual activity: Yes    Birth control/protection: None  Other Topics Concern   Not on file  Social History Narrative   Patient is right-handed. She lives in a 2 level home. Drinks one cup of coffee some days. She walks daily.   Social Determinants of Health   Financial Resource Strain: Not on file  Food Insecurity: Not on file  Transportation Needs: Not on file  Physical Activity: Not on file  Stress: Not on file  Social Connections: Not on file    Her Allergies Are:  No Known Allergies:   Her Current Medications Are:  Outpatient Encounter Medications as of 05/30/2021  Medication Sig   amLODipine (NORVASC) 2.5 MG tablet TAKE 1 TABLET BY MOUTH EVERY DAY   buPROPion (WELLBUTRIN XL) 150 MG 24 hr tablet Take 150 mg by mouth every morning.   buPROPion (WELLBUTRIN XL) 300 MG 24 hr tablet Take 300 mg by mouth daily with breakfast.   diphenhydramine-acetaminophen (TYLENOL PM) 25-500 MG TABS tablet Take 1-2 tablets by mouth at bedtime.   famotidine (PEPCID) 40 MG tablet Take 40 mg by mouth at bedtime.   FLUoxetine (PROZAC) 40 MG capsule Take 40 mg by mouth daily with breakfast.   ibuprofen (ADVIL,MOTRIN) 800 MG tablet ibuprofen 800 mg tablet  TAKE 1 TABLET 3 TIMES A DAY AS NEEDED FOR PAIN   pantoprazole (PROTONIX) 40 MG tablet Take 1 tablet 20 to 30 minutes before breakfast and 1 tablet 20 to 30 minutes before dinner meal.\   Semaglutide,0.25 or 0.5MG /DOS, (OZEMPIC, 0.25 OR 0.5  MG/DOSE,) 2 MG/1.5ML SOPN Inject 0.25 mg into the skin once a week.   Ubrogepant (UBRELVY PO) Take by mouth.   Fremanezumab-vfrm (AJOVY) 225 MG/1.5ML SOAJ Inject 225 mg into the skin every 28 (twenty-eight) days. (Patient not taking: Reported on 05/30/2021)   No facility-administered encounter medications on file as of 05/30/2021.  :   Review of Systems:  Out of a complete 14 point review of systems, all are reviewed and negative with the exception of these symptoms as listed below:  Review of Systems  Neurological:        Patient is here alone for a sleep consult. She endorses daytime sleepiness, nighttime wakening, snoring and elevated BP. ESS 9, FSS 46.   Objective:  Neurological Exam  Physical Exam Physical Examination:   Vitals:   05/30/21 1514  BP: (!) 135/92  Pulse: 80    General Examination: The patient is a very pleasant 50 y.o. female in no acute distress. She appears well-developed and well-nourished and well groomed.   HEENT: Normocephalic, atraumatic, pupils are equal, round and reactive to light, extraocular tracking is good without limitation to gaze excursion or nystagmus noted. Hearing is grossly intact. Face is symmetric with normal facial animation. Speech is clear with no dysarthria noted. There is no hypophonia. There is no lip, neck/head, jaw or voice tremor. Neck is supple with full range of passive and active motion. There are no carotid bruits on auscultation. Oropharynx exam reveals: mild mouth dryness, good dental hygiene and mild airway crowding, due to small airway entry, Mallampati class II, tonsils on the smaller side.  Tongue protrudes centrally and palate elevates symmetrically.  Neck circumference of 15 three-quarter inches.  She has a mild overbite.  Chest: Clear to auscultation without wheezing, rhonchi or crackles noted.  Heart: S1+S2+0, regular and normal without murmurs, rubs or gallops noted.   Abdomen: Soft, non-tender and non-with normal  bowel sounds appreciated on auscultation.  Extremities: There is trace pitting edema in the distal lower extremities bilaterally.   Skin: Warm and dry without trophic changes noted.   Musculoskeletal: exam reveals no obvious joint deformities, tenderness or joint swelling or erythema.   Neurologically:  Mental status: The patient is awake, alert and oriented in all 4 spheres. Her immediate and remote memory, attention, language skills and fund of knowledge are appropriate. There is no evidence of aphasia, agnosia, apraxia or anomia. Speech is clear with normal prosody and enunciation. Thought process is linear. Mood is normal and affect is normal.  Cranial nerves II - XII are as described above under HEENT exam.  Motor exam: Normal bulk, strength and tone is noted. There is no tremor, fine motor skills and coordination: grossly intact.  Cerebellar testing: No dysmetria or intention tremor. There is no truncal or gait ataxia.  Sensory exam: intact to light touch in the upper and lower extremities.  Gait, station and balance: She stands easily. No veering to one side is noted. No leaning to one side is noted. Posture is age-appropriate and stance is narrow based. Gait shows normal stride length and normal pace. No problems turning are noted.   Assessment and Plan:  In summary, SHARIAH ASSAD is a very pleasant 50 y.o.-year old female with an underlying medical history of reflux disease, migraine headaches and dizziness (followed by Dr. Tomi Likens at [facility] neurology), anxiety, depression, borderline diabetes, history of kidney stones, arthritis, and obesity, whose history and physical exam are concerning for obstructive sleep apnea (OSA). I had a long chat with the patient about my findings and the diagnosis of OSA, its prognosis and treatment options. We talked about medical treatments, surgical interventions and non-pharmacological approaches. I explained in particular the risks and ramifications of  untreated moderate to severe OSA, especially with respect to developing cardiovascular disease down the Road, including congestive heart failure, difficult to treat hypertension, cardiac arrhythmias, or stroke. Even type 2 diabetes has, in part, been linked to untreated OSA. Symptoms of untreated OSA include daytime sleepiness, memory problems, mood irritability and mood  disorder such as depression and anxiety, lack of energy, as well as recurrent headaches, especially morning headaches. We talked about trying to maintain a healthy lifestyle in general, as well as the importance of weight control. We also talked about the importance of good sleep hygiene. I recommended the following at this time: sleep study.  I outlined the difference between a laboratory attended sleep study versus home sleep test.  I explained the sleep test procedure to the patient and also outlined possible surgical and non-surgical treatment options of OSA, including the use of a custom-made dental device (which would require a referral to a specialist dentist or oral surgeon), upper airway surgical options, such as traditional UPPP or a novel less invasive surgical option in the form of Inspire hypoglossal nerve stimulation (which would involve a referral to an ENT surgeon). I also explained the CPAP treatment option to the patient, who indicated that she would be willing to try CPAP if the need arises. I explained the importance of being compliant with PAP treatment, not only for insurance purposes but primarily to improve Her symptoms, and for the patient's long term health benefit, including to reduce Her cardiovascular risks. I answered all her questions today and the patient was in agreement. I plan to see her back after the sleep study is completed and encouraged her to call with any interim questions, concerns, problems or updates.   Thank you very much for allowing me to participate in the care of this nice patient. If I can be  of any further assistance to you please do not hesitate to call me at [phone].  Sincerely,   Star Age, MD, PhD

## 2021-06-12 ENCOUNTER — Telehealth: Payer: Self-pay

## 2021-06-12 NOTE — Telephone Encounter (Signed)
New message   Twisha Nairn (Key: [id number]) Aimovig 140MG /ML auto-injectors   Form Charity fundraiser PA Form (2017 NCPDP) Created 18 hours ago Sent to Plan 5 minutes ago Plan Response 4 minutes ago Submit Clinical Questions 2 minutes ago Determination Wait for Determination Please wait for [employer] NCPDP 2017 to return a determination.   Your information has been submitted to [place]. To check for an updated outcome later, reopen this PA request from your dashboard.  If [employer] has not responded to your request within 24 hours, contact [place] at [phone]. If you think there may be a problem with your PA request, use our live chat feature at the bottom right.

## 2021-06-12 NOTE — Telephone Encounter (Signed)
LVM for pt to call me back to schedule sleep study  

## 2021-06-19 ENCOUNTER — Telehealth: Payer: Self-pay

## 2021-06-19 NOTE — Telephone Encounter (Signed)
LVM for pt to call me back to schedule sleep study  

## 2021-06-23 ENCOUNTER — Ambulatory Visit: Payer: BC Managed Care – PPO | Admitting: Family Medicine

## 2021-06-23 ENCOUNTER — Encounter: Payer: Self-pay | Admitting: Family Medicine

## 2021-06-23 ENCOUNTER — Other Ambulatory Visit: Payer: Self-pay

## 2021-06-23 VITALS — BP 120/88 | HR 90 | Temp 97.8°F | Resp 18 | Ht 66.5 in | Wt 224.4 lb

## 2021-06-23 DIAGNOSIS — K5909 Other constipation: Secondary | ICD-10-CM | POA: Diagnosis not present

## 2021-06-23 DIAGNOSIS — R7303 Prediabetes: Secondary | ICD-10-CM

## 2021-06-23 DIAGNOSIS — E669 Obesity, unspecified: Secondary | ICD-10-CM

## 2021-06-23 LAB — BASIC METABOLIC PANEL
BUN: 14 mg/dL (ref 6–23)
CO2: 28 mEq/L (ref 19–32)
Calcium: 9 mg/dL (ref 8.4–10.5)
Chloride: 106 mEq/L (ref 96–112)
Creatinine, Ser: 0.96 mg/dL (ref 0.40–1.20)
GFR: 68.97 mL/min (ref >60.00)
Glucose, Bld: 77 mg/dL (ref 70–99)
Potassium: 3.9 mEq/L (ref 3.5–5.1)
Sodium: 140 mEq/L (ref 135–145)

## 2021-06-23 LAB — HEMOGLOBIN A1C: Hgb A1c MFr Bld: 5.9 % (ref 4.6–6.5)

## 2021-06-23 MED ORDER — SEMAGLUTIDE (1 MG/DOSE) 4 MG/3ML ~~LOC~~ SOPN: 1.0000 mg | PEN_INJECTOR | SUBCUTANEOUS | 3 refills | Status: DC

## 2021-06-23 NOTE — Assessment & Plan Note (Signed)
Some improvement w/ addition of Magnesium supplement and 'The Cleaner'.  Encouraged increased water to help keep things moving.  Will follow.

## 2021-06-23 NOTE — Patient Instructions (Addendum)
Follow up in 3-4 months to recheck weight loss and sugars We'll notify you of your lab results and make any changes if needed Continue to work on low carb diet and regular exercise- you can do it! Increase the Ozempic to 1mg  weekly.  IF the side effects are too much, we can always go back down Make sure you drink plenty of water to help w/ your constipation Call with any questions or concerns Happy Holidays!!

## 2021-06-23 NOTE — Assessment & Plan Note (Signed)
Ongoing issue for pt.  She is down 9 lbs since last visit.  Applauded her efforts.  She wants to increase the Ozempic to 1mg  weekly.  New prescription sent.  Encouraged low carb diet and regular exercise.

## 2021-06-23 NOTE — Addendum Note (Signed)
Addended by: Midge Minium on: 06/23/2021 10:30 AM   Modules accepted: Orders

## 2021-06-23 NOTE — Assessment & Plan Note (Signed)
Last A1C was 6.5%  She has since started Ozempic for weight loss and sugar control.  Repeat A1C today and determine if any other interventions are needed.

## 2021-06-23 NOTE — Progress Notes (Signed)
   Subjective:    Patient ID: Emma Wagner, female    DOB: 08-29-1970, 50 y.o.   MRN: [id number]  HPI Pre-DM- due for repeat A1C.  Hoping to control w/ diet, exercise, and weight loss.  Also on Ozempic weekly x5 weeks.  Obesity- ongoing issue for pt.  Started Ozempic at last visit and is down 9 lbs.  Currently on 0.5mg  Pt reports feeling good about the weight loss.  Is eating less than previously.  Has a physical job- 'a lot of walking'- but no formal exercise.  Pt reports nausea is tolerable.  Has sleep study pending.    Chronic constipation- pt is now going 2-3x/week by using Magnesium, 'The Cleaner'.  This is an improvement but still not at goal of daily BM.     Review of Systems For ROS see HPI   This visit occurred during the SARS-CoV-2 public health emergency.  Safety protocols were in place, including screening questions prior to the visit, additional usage of staff PPE, and extensive cleaning of exam room while observing appropriate contact time as indicated for disinfecting solutions.      Objective:   Physical Exam Vitals reviewed.  Constitutional:      General: She is not in acute distress.    Appearance: Normal appearance. She is well-developed. She is obese. She is not ill-appearing.  HENT:     Head: Normocephalic and atraumatic.  Eyes:     Conjunctiva/sclera: Conjunctivae normal.     Pupils: Pupils are equal, round, and reactive to light.  Neck:     Thyroid: No thyromegaly.  Cardiovascular:     Rate and Rhythm: Normal rate and regular rhythm.     Pulses: Normal pulses.     Heart sounds: Normal heart sounds. No murmur heard. Pulmonary:     Effort: Pulmonary effort is normal. No respiratory distress.     Breath sounds: Normal breath sounds.  Abdominal:     General: There is no distension.     Palpations: Abdomen is soft.     Tenderness: There is no abdominal tenderness.  Musculoskeletal:     Cervical back: Normal range of motion and neck supple.     Right lower  leg: No edema.     Left lower leg: No edema.  Lymphadenopathy:     Cervical: No cervical adenopathy.  Skin:    General: Skin is warm and dry.  Neurological:     Mental Status: She is alert and oriented to person, place, and time.  Psychiatric:        Behavior: Behavior normal.          Assessment & Plan:

## 2021-06-26 ENCOUNTER — Telehealth: Payer: Self-pay

## 2021-06-26 NOTE — Telephone Encounter (Signed)
We have attempted to call the patient 2 times to schedule sleep study. Patient has been unavailable at the phone numbers we have on file and has not returned our calls. If patient calls back we will schedule them for their sleep study. ° °

## 2021-06-27 ENCOUNTER — Other Ambulatory Visit: Payer: Self-pay | Admitting: Family Medicine

## 2021-06-27 DIAGNOSIS — I10 Essential (primary) hypertension: Secondary | ICD-10-CM

## 2021-06-27 NOTE — Telephone Encounter (Signed)
Requesting 90 day rx

## 2021-07-17 ENCOUNTER — Other Ambulatory Visit: Payer: Self-pay

## 2021-07-17 MED ORDER — ZEMBRACE SYMTOUCH 3 MG/0.5ML ~~LOC~~ SOAJ
3.0000 mg | SUBCUTANEOUS | 5 refills | Status: DC | PRN
Start: 2021-07-17 — End: 2021-12-25

## 2021-07-24 ENCOUNTER — Encounter: Payer: Self-pay | Admitting: Neurology

## 2021-09-06 ENCOUNTER — Telehealth: Payer: Self-pay

## 2021-09-06 NOTE — Telephone Encounter (Signed)
F/u  RE: Emma Wagner approved your request for coverage of Nurtec 75MG  OR TBDP.  Dear Emma Wagner:  Emma Wagner pleased to let you know that we've approved your or your doctor's request for coverage (sometimes called a prior authorization) for Nurtec 75MG  OR TBDP. You can now fill your prescription, and it will be covered according to your plan.  As long as you remain covered by your prescription drug plan and there are no changes to your plan benefits, this request is approved from 09/06/2021 to 09/06/2022. When this approval expires, please speak to your doctor about your treatment.

## 2021-09-06 NOTE — Telephone Encounter (Signed)
New message  JAZARA SWINEY (Key: [id number]) Nurtec 75MG  dispersible tablets   Form Charity fundraiser PA Form (2017 NCPDP) Created 10 hours ago Sent to Plan 3 minutes ago Plan Response 3 minutes ago Submit Clinical Questions 1 minute ago Determination Wait for Determination Please wait for [employer] NCPDP 2017 to return a determination.   Your information has been submitted to [place]. To check for an updated outcome later, reopen this PA request from your dashboard.  If [employer] has not responded to your request within 24 hours, contact [place] at [phone]. If you think there may be a problem with your PA request, use our live chat feature at the bottom right.

## 2021-09-07 ENCOUNTER — Telehealth: Payer: Self-pay

## 2021-09-07 NOTE — Telephone Encounter (Signed)
New message   Emma Wagner Key: [id number] help? Call us at [phone] Status Sent to [place] 100MG  tablets Form [employer] Electronic PA Form [phone] NCPDP)  Your demographic data has been sent to [place] successfully!  [employer] typically takes 5-10 minutes to respond, but it may take a little longer in some cases. You will be notified by email when available. You can also check for an update later by opening this request from your dashboard. Please do not fax or call [employer] to resubmit this request. If you need assistance, please chat with CoverMyMeds or call us at [phone].  If it has been longer than 24 hours, please reach out to [place].

## 2021-09-08 NOTE — Telephone Encounter (Signed)
F/u  Bernese Dearden Key: [id number] - PA Case ID: [id number] Need help? Call us at [phone]  Outcome Approved on January 19   Effective date 09/07/21 to  09/07/2022   Your PA request has been approved. Additional information will be provided in the approval communication. (Message 1145)  Drug Roselyn Meier 100MG  tablets Form [employer] Electronic PA Form [phone] NCPDP)

## 2021-09-18 ENCOUNTER — Encounter: Payer: Self-pay | Admitting: Family Medicine

## 2021-09-20 ENCOUNTER — Other Ambulatory Visit: Payer: Self-pay

## 2021-09-20 ENCOUNTER — Encounter: Payer: Self-pay | Admitting: Registered Nurse

## 2021-09-20 ENCOUNTER — Telehealth (INDEPENDENT_AMBULATORY_CARE_PROVIDER_SITE_OTHER): Payer: BC Managed Care – PPO | Admitting: Registered Nurse

## 2021-09-20 DIAGNOSIS — Z87442 Personal history of urinary calculi: Secondary | ICD-10-CM | POA: Diagnosis not present

## 2021-09-20 DIAGNOSIS — R109 Unspecified abdominal pain: Secondary | ICD-10-CM

## 2021-09-20 MED ORDER — OXYCODONE-ACETAMINOPHEN 5-325 MG PO TABS: 1.0000 | ORAL_TABLET | ORAL | 0 refills | Status: AC | PRN

## 2021-09-20 MED ORDER — ONDANSETRON HCL 4 MG PO TABS: 4.0000 mg | ORAL_TABLET | Freq: Three times a day (TID) | ORAL | 0 refills | Status: DC | PRN

## 2021-09-20 MED ORDER — TAMSULOSIN HCL 0.4 MG PO CAPS: 0.4000 mg | ORAL_CAPSULE | Freq: Every day | ORAL | 3 refills | Status: DC

## 2021-09-20 NOTE — Patient Instructions (Signed)
° ° ° °  If you have lab work done today you will be contacted with your lab results within the next 2 weeks.  If you have not heard from us then please contact us. The fastest way to get your results is to register for My Chart. ° ° °IF you received an x-ray today, you will receive an invoice from [facility]. Please contact [facility] at [phone] with questions or concerns regarding your invoice.  ° °IF you received labwork today, you will receive an invoice from [facility]. Please contact [facility] at [phone] with questions or concerns regarding your invoice.  ° °Our billing staff will not be able to assist you with questions regarding bills from these companies. ° °You will be contacted with the lab results as soon as they are available. The fastest way to get your results is to activate your My Chart account. Instructions are located on the last page of this paperwork. If you have not heard from us regarding the results in 2 weeks, please contact this office. °  ° ° ° °

## 2021-09-20 NOTE — Progress Notes (Signed)
Telemedicine Encounter- SOAP NOTE Established Patient  This video encounter was conducted with the patient's (or proxy's) verbal consent via audio telecommunications: yes/no: Yes Patient was instructed to have this encounter in a suitably private space; and to only have persons present to whom they give permission to participate. In addition, patient identity was confirmed by use of name plus two identifiers (DOB and address).  I discussed the limitations, risks, security and privacy concerns of performing an evaluation and management service by telephone and the availability of in person appointments. I also discussed with the patient that there may be a patient responsible charge related to this service. The patient expressed understanding and agreed to proceed.  I spent a total of 18 talking with the patient or their proxy.  Patient at home Provider in office  Participants: Kathrin Ruddy, NP and Gwen Pounds  Chief Complaint  Patient presents with   Nephrolithiasis    Patient states she believes she has kidney stones again because she knows the pain too well since Saturday. Patient would like some medication to help the stones pass.    Subjective   Emma Wagner is a 51 y.o. established patient. Video visit today for  HPI Pain on right side Acute onset Saturday Intermittent Has had kidney stones before, very familiar with pain.  Did have nausea and vomiting with pain  No fevers, chills, sweats, fatigue, diarrhea, gross hematuria.  Did well last time with oxycodone, flomax, and zofran.  Patient Active Problem List   Diagnosis Date Noted   HTN (hypertension) 05/10/2021   Chronic constipation 03/04/2019   Migraines 11/21/2018   Postoperative anemia due to acute blood loss 06/16/2016   Primary osteoarthritis of left hip 10/31/2015   Primary osteoarthritis of right hip 10/30/2015   Pain of right upper arm 05/12/2015   Hair loss 05/02/2012   Prediabetes 02/20/2012    Thrombosed external hemorrhoid 02/08/2012   Post-nasal drip 11/27/2011   Anxiety and depression 11/27/2011   Arthritis of right hip 11/27/2011   General medical examination 10/29/2011   Cough 09/17/2011   Obesity (BMI 30-39.9) 09/17/2011   Tobacco abuse 08/03/2011   X-ray of lung, abnormal 06/11/2007   GERD 06/11/2007    Past Medical History:  Diagnosis Date   Anxiety    add also   Arthritis    Borderline type 2 diabetes mellitus since 2013   -lost weight and quit smoking and is no longer PRE diabetic   Bursitis    bilateral hips, more on right hip   Chronic kidney disease    h/o kidney stone 2016   Constipation    Depression    Frequent urination    while kidney stone evident   GERD (gastroesophageal reflux disease)    Headache    "every blue moon. not often"   Hemorrhoids    History of kidney stones    Pneumonia    PONV (postoperative nausea and vomiting)    nausea with appendectomy and with hip replacement in March 2017    Current Outpatient Medications  Medication Sig Dispense Refill   amLODipine (NORVASC) 2.5 MG tablet TAKE 1 TABLET BY MOUTH EVERY DAY 90 tablet 1   buPROPion (WELLBUTRIN XL) 300 MG 24 hr tablet Take 300 mg by mouth daily with breakfast.  12   diphenhydramine-acetaminophen (TYLENOL PM) 25-500 MG TABS tablet Take 1-2 tablets by mouth at bedtime.     famotidine (PEPCID) 40 MG tablet Take 40 mg by mouth at bedtime.  ibuprofen (ADVIL,MOTRIN) 800 MG tablet ibuprofen 800 mg tablet  TAKE 1 TABLET 3 TIMES A DAY AS NEEDED FOR PAIN     pantoprazole (PROTONIX) 40 MG tablet Take 1 tablet 20 to 30 minutes before breakfast and 1 tablet 20 to 30 minutes before dinner meal.\ 180 tablet 3   Semaglutide, 1 MG/DOSE, 4 MG/3ML SOPN Inject 1 mg as directed once a week. 3 mL 3   Ubrogepant (UBRELVY PO) Take by mouth.     ZEMBRACE SYMTOUCH 3 MG/0.5ML SOAJ Inject 3 mg into the skin as needed. 0.5 mL 5   buPROPion (WELLBUTRIN XL) 150 MG 24 hr tablet Take 150 mg by mouth  every morning.  12   FLUoxetine (PROZAC) 40 MG capsule Take 40 mg by mouth daily with breakfast.  12   Fremanezumab-vfrm (AJOVY) 225 MG/1.5ML SOAJ Inject 225 mg into the skin every 28 (twenty-eight) days. (Patient not taking: No sig reported) 1.68 mL 5   No current facility-administered medications for this visit.    No Known Allergies  Social History   Socioeconomic History   Marital status: Single    Spouse name: Not on file   Number of children: 2   Years of education: 29   Highest education level: 12th grade  Occupational History   Occupation: unemployed  Tobacco Use   Smoking status: Former    Packs/day: 1.00    Years: 26.00    Pack years: 26.00    Types: Cigarettes    Quit date: 07/20/2012    Years since quitting: 9.1   Smokeless tobacco: Never  Vaping Use   Vaping Use: Never used  Substance and Sexual Activity   Alcohol use: Yes    Comment: occasional   Drug use: No   Sexual activity: Yes    Birth control/protection: None  Other Topics Concern   Not on file  Social History Narrative   Patient is right-handed. She lives in a 2 level home. Drinks one cup of coffee some days. She walks daily.   Social Determinants of Health   Financial Resource Strain: Not on file  Food Insecurity: Not on file  Transportation Needs: Not on file  Physical Activity: Not on file  Stress: Not on file  Social Connections: Not on file  Intimate Partner Violence: Not on file    ROS Per hpi, otherwise negative.  Objective   Vitals as reported by the patient: There were no vitals filed for this visit.  There are no diagnoses linked to this encounter.  PLAN Likely nephrolithiasis but pt understands imaging and labs req'd for distinct dx Reviewed risks, benefits, alternatives to treatment plan with patient who voices understanding. Flomax daily, zofran and oxycodone-acetaminophen prn as above. Return if worsening or failing to improve. Consider CT abd/pelv for renal  stone Patient encouraged to call clinic with any questions, comments, or concerns.  I discussed the assessment and treatment plan with the patient. The patient was provided an opportunity to ask questions and all were answered. The patient agreed with the plan and demonstrated an understanding of the instructions.   The patient was advised to call back or seek an in-person evaluation if the symptoms worsen or if the condition fails to improve as anticipated.  I provided 18 minutes of face-to-face time during this encounter.  Maximiano Coss, NP

## 2021-09-22 ENCOUNTER — Telehealth: Payer: BC Managed Care – PPO | Admitting: Family Medicine

## 2021-11-02 ENCOUNTER — Ambulatory Visit (HOSPITAL_BASED_OUTPATIENT_CLINIC_OR_DEPARTMENT_OTHER)
Admission: RE | Admit: 2021-11-02 | Discharge: 2021-11-02 | Disposition: A | Discharge status: Home / Self Care | Payer: No Typology Code available for payment source | Source: Ambulatory Visit | Attending: Family Medicine | Admitting: Family Medicine

## 2021-11-02 ENCOUNTER — Ambulatory Visit: Payer: BC Managed Care – PPO | Admitting: Family Medicine

## 2021-11-02 ENCOUNTER — Encounter: Payer: Self-pay | Admitting: Family Medicine

## 2021-11-02 ENCOUNTER — Other Ambulatory Visit: Payer: Self-pay | Admitting: Family Medicine

## 2021-11-02 ENCOUNTER — Other Ambulatory Visit: Payer: Self-pay

## 2021-11-02 DIAGNOSIS — M25532 Pain in left wrist: Secondary | ICD-10-CM

## 2021-11-02 DIAGNOSIS — M542 Cervicalgia: Secondary | ICD-10-CM | POA: Diagnosis not present

## 2021-11-02 DIAGNOSIS — I10 Essential (primary) hypertension: Secondary | ICD-10-CM

## 2021-11-02 DIAGNOSIS — M545 Low back pain, unspecified: Secondary | ICD-10-CM | POA: Diagnosis not present

## 2021-11-02 DIAGNOSIS — M25519 Pain in unspecified shoulder: Secondary | ICD-10-CM | POA: Insufficient documentation

## 2021-11-02 DIAGNOSIS — M25512 Pain in left shoulder: Secondary | ICD-10-CM | POA: Insufficient documentation

## 2021-11-02 DIAGNOSIS — M79605 Pain in left leg: Secondary | ICD-10-CM | POA: Diagnosis not present

## 2021-11-02 DIAGNOSIS — Z87442 Personal history of urinary calculi: Secondary | ICD-10-CM

## 2021-11-02 DIAGNOSIS — S4992XA Unspecified injury of left shoulder and upper arm, initial encounter: Secondary | ICD-10-CM | POA: Diagnosis not present

## 2021-11-02 MED ORDER — KETOROLAC TROMETHAMINE 60 MG/2ML IM SOLN
60.0000 mg | Freq: Once | INTRAMUSCULAR | Status: AC
  Administered 2021-11-02: 60 mg via INTRAMUSCULAR

## 2021-11-02 MED ORDER — METHOCARBAMOL 500 MG PO TABS: 500.0000 mg | ORAL_TABLET | Freq: Three times a day (TID) | ORAL | 0 refills | Status: DC | PRN

## 2021-11-02 MED ORDER — PREDNISONE 10 MG PO TABS: ORAL_TABLET | ORAL | 0 refills | Status: DC

## 2021-11-02 MED ORDER — ONDANSETRON HCL 4 MG PO TABS: 4.0000 mg | ORAL_TABLET | Freq: Three times a day (TID) | ORAL | 0 refills | Status: DC | PRN

## 2021-11-02 NOTE — Patient Instructions (Signed)
Follow up in 3-4 weeks to recheck pain, blood pressure ?Go to [facility] (right [address]) to get your xrays ?We'll notify you of your imaging results and make any changes if needed ?As your symptoms improve, let me know so we can refer to PT ?START the Prednisone as directed- take w/ food ?USE the Methocarbamol as needed for spasm ?HEAT!!!! ?Use the Ondansetron (Zofran) as needed for nausea ?Call with any questions or concerns ?Hang in there!!! ?
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
## 2021-11-03 ENCOUNTER — Other Ambulatory Visit: Payer: Self-pay

## 2021-11-03 MED ORDER — PANTOPRAZOLE SODIUM 40 MG PO TBEC: DELAYED_RELEASE_TABLET | ORAL | 2 refills | Status: DC

## 2021-11-05 ENCOUNTER — Encounter: Payer: Self-pay | Admitting: Family Medicine

## 2021-11-07 DIAGNOSIS — M545 Low back pain, unspecified: Secondary | ICD-10-CM | POA: Diagnosis not present

## 2021-11-07 DIAGNOSIS — S43402A Unspecified sprain of left shoulder joint, initial encounter: Secondary | ICD-10-CM | POA: Diagnosis not present

## 2021-11-07 DIAGNOSIS — M542 Cervicalgia: Secondary | ICD-10-CM | POA: Diagnosis not present
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
## 2021-11-09 ENCOUNTER — Ambulatory Visit: Payer: BC Managed Care – PPO | Admitting: Neurology

## 2021-11-13 ENCOUNTER — Other Ambulatory Visit: Payer: Self-pay

## 2021-11-13 ENCOUNTER — Ambulatory Visit: Payer: BC Managed Care – PPO | Admitting: Neurology

## 2021-11-13 ENCOUNTER — Encounter: Payer: Self-pay | Admitting: Neurology

## 2021-11-13 ENCOUNTER — Encounter: Payer: Self-pay | Admitting: Family Medicine

## 2021-11-13 VITALS — BP 130/77 | HR 113 | Ht 66.0 in | Wt 231.0 lb

## 2021-11-13 DIAGNOSIS — G43009 Migraine without aura, not intractable, without status migrainosus: Secondary | ICD-10-CM | POA: Diagnosis not present

## 2021-11-13 DIAGNOSIS — G5603 Carpal tunnel syndrome, bilateral upper limbs: Secondary | ICD-10-CM | POA: Diagnosis not present

## 2021-11-13 NOTE — Patient Instructions (Signed)
Ubrelvy, zembrace and Zofran for nausea if needed ?Limit use of pain relievers to no more than 2 days out of week to prevent risk of rebound or medication-overuse headache. ?Keep headache diary ?Follow up 6 months. ?

## 2021-11-24 ENCOUNTER — Other Ambulatory Visit: Payer: Self-pay | Admitting: Family Medicine

## 2021-12-06 ENCOUNTER — Other Ambulatory Visit: Payer: Self-pay

## 2021-12-06 ENCOUNTER — Encounter: Payer: Self-pay | Admitting: Family Medicine

## 2021-12-06 ENCOUNTER — Telehealth (INDEPENDENT_AMBULATORY_CARE_PROVIDER_SITE_OTHER): Payer: BC Managed Care – PPO | Admitting: Family Medicine

## 2021-12-06 DIAGNOSIS — E669 Obesity, unspecified: Secondary | ICD-10-CM

## 2021-12-06 MED ORDER — SEMAGLUTIDE (2 MG/DOSE) 8 MG/3ML ~~LOC~~ SOPN
2.0000 mg | PEN_INJECTOR | SUBCUTANEOUS | 3 refills | Status: DC
Start: 2021-12-06 — End: 2022-05-21
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
## 2021-12-06 NOTE — Patient Instructions (Signed)
° ° ° °  If you have lab work done today you will be contacted with your lab results within the next 2 weeks.  If you have not heard from us then please contact us. The fastest way to get your results is to register for My Chart. ° ° °IF you received an x-ray today, you will receive an invoice from [facility]. Please contact [facility] at [phone] with questions or concerns regarding your invoice.  ° °IF you received labwork today, you will receive an invoice from [facility]. Please contact [facility] at [phone] with questions or concerns regarding your invoice.  ° °Our billing staff will not be able to assist you with questions regarding bills from these companies. ° °You will be contacted with the lab results as soon as they are available. The fastest way to get your results is to activate your My Chart account. Instructions are located on the last page of this paperwork. If you have not heard from us regarding the results in 2 weeks, please contact this office. °  ° ° ° °

## 2021-12-15 ENCOUNTER — Other Ambulatory Visit: Payer: Self-pay | Admitting: Registered Nurse

## 2021-12-15 DIAGNOSIS — R109 Unspecified abdominal pain: Secondary | ICD-10-CM

## 2021-12-15 DIAGNOSIS — Z87442 Personal history of urinary calculi: Secondary | ICD-10-CM

## 2021-12-17 ENCOUNTER — Encounter: Payer: Self-pay | Admitting: Family Medicine

## 2021-12-20 ENCOUNTER — Encounter: Payer: Self-pay | Admitting: Family Medicine

## 2021-12-20 ENCOUNTER — Other Ambulatory Visit: Payer: Self-pay | Admitting: Neurology

## 2021-12-20 ENCOUNTER — Other Ambulatory Visit: Payer: Self-pay | Admitting: Family Medicine

## 2021-12-20 DIAGNOSIS — I10 Essential (primary) hypertension: Secondary | ICD-10-CM

## 2021-12-21 MED ORDER — PHENTERMINE HCL 37.5 MG PO CAPS: 37.5000 mg | ORAL_CAPSULE | ORAL | 0 refills | Status: DC

## 2021-12-25 ENCOUNTER — Other Ambulatory Visit: Payer: Self-pay

## 2021-12-25 MED ORDER — ZEMBRACE SYMTOUCH 3 MG/0.5ML ~~LOC~~ SOAJ: 3.0000 mg | SUBCUTANEOUS | 5 refills | Status: DC | PRN

## 2022-01-08 ENCOUNTER — Other Ambulatory Visit: Payer: Self-pay | Admitting: Family Medicine

## 2022-01-08 DIAGNOSIS — R109 Unspecified abdominal pain: Secondary | ICD-10-CM

## 2022-01-08 DIAGNOSIS — Z87442 Personal history of urinary calculi: Secondary | ICD-10-CM

## 2022-01-26 DIAGNOSIS — H52203 Unspecified astigmatism, bilateral: Secondary | ICD-10-CM | POA: Diagnosis not present

## 2022-01-26 DIAGNOSIS — D3132 Benign neoplasm of left choroid: Secondary | ICD-10-CM | POA: Diagnosis not present

## 2022-01-31 ENCOUNTER — Encounter: Payer: Self-pay | Admitting: Family Medicine

## 2022-01-31 NOTE — Telephone Encounter (Signed)
Please advise 

## 2022-02-05 ENCOUNTER — Telehealth: Payer: Self-pay

## 2022-02-05 ENCOUNTER — Other Ambulatory Visit: Payer: Self-pay | Admitting: Family Medicine

## 2022-02-05 DIAGNOSIS — Z87442 Personal history of urinary calculi: Secondary | ICD-10-CM

## 2022-02-05 DIAGNOSIS — R109 Unspecified abdominal pain: Secondary | ICD-10-CM

## 2022-02-05 NOTE — Telephone Encounter (Signed)
Attempted to call patient because she should have a refill left at the pharmacy for the medication.

## 2022-02-21 DIAGNOSIS — K146 Glossodynia: Secondary | ICD-10-CM | POA: Diagnosis not present

## 2022-02-21 DIAGNOSIS — K140 Glossitis: Secondary | ICD-10-CM | POA: Diagnosis not present

## 2022-04-10 ENCOUNTER — Encounter: Payer: Self-pay | Admitting: Neurology

## 2022-04-10 NOTE — Telephone Encounter (Signed)
Spoke with Dr Rexene Alberts. She is ok with patient proceeding now. If we need to place a new order we can. If she needs another F2F visit we can do that as well. For now, will check with sleep lab and see if patient can go ahead with sleep study.

## 2022-04-11 ENCOUNTER — Telehealth: Payer: Self-pay | Admitting: Neurology

## 2022-04-11 NOTE — Telephone Encounter (Signed)
[employer] pending faxed notes.

## 2022-04-19 NOTE — Telephone Encounter (Signed)
LVM for pt to call back schedule   [employer] auth: [id number] (exp. 04/11/22 to 04/12/23)

## 2022-05-11 ENCOUNTER — Telehealth: Payer: Self-pay

## 2022-05-11 NOTE — Telephone Encounter (Signed)
PA for Ozempic has been submitted via CoverMyMeds

## 2022-05-12 ENCOUNTER — Other Ambulatory Visit: Payer: Self-pay | Admitting: Family Medicine

## 2022-05-13 ENCOUNTER — Ambulatory Visit (INDEPENDENT_AMBULATORY_CARE_PROVIDER_SITE_OTHER): Payer: BC Managed Care – PPO | Admitting: Neurology

## 2022-05-13 DIAGNOSIS — R351 Nocturia: Secondary | ICD-10-CM

## 2022-05-13 DIAGNOSIS — R0683 Snoring: Secondary | ICD-10-CM | POA: Diagnosis not present

## 2022-05-13 DIAGNOSIS — G4733 Obstructive sleep apnea (adult) (pediatric): Secondary | ICD-10-CM

## 2022-05-13 DIAGNOSIS — G4719 Other hypersomnia: Secondary | ICD-10-CM

## 2022-05-13 DIAGNOSIS — R0689 Other abnormalities of breathing: Secondary | ICD-10-CM

## 2022-05-13 DIAGNOSIS — R519 Headache, unspecified: Secondary | ICD-10-CM

## 2022-05-13 DIAGNOSIS — G4720 Circadian rhythm sleep disorder, unspecified type: Secondary | ICD-10-CM

## 2022-05-13 DIAGNOSIS — E669 Obesity, unspecified: Secondary | ICD-10-CM

## 2022-05-14 NOTE — Telephone Encounter (Signed)
PA to renew Ozempic has been denied now that her A1C is in normal range is there other rational I can provide for an appeal to continue the medication?

## 2022-05-15 ENCOUNTER — Encounter: Payer: Self-pay | Admitting: Family Medicine

## 2022-05-15 NOTE — Telephone Encounter (Signed)
[employer] plan and they are trying to help me do a review to attempt one more time to get the medication covered otherwise Victoza and trulicity are preferred for her insurance

## 2022-05-15 NOTE — Telephone Encounter (Signed)
Called pt and informed her of the insurance denial, she understood the reasoning did ask if there was any other medications she can try for this as she does not want her A1c to go back up or to start to gain weight again I told her we can check with insurance about preferred medications and go from there but I could not make any guarantees.

## 2022-05-15 NOTE — Telephone Encounter (Signed)
Her A1C is now in normal range bc she is on the medication!!!  We don't get someone to goal and then stop the medicine that is working for them

## 2022-05-15 NOTE — Telephone Encounter (Signed)
Patient has no documentation of Diabetes type 2 in her snap shot, last A1c and last visit for this was 06/23/2022 at this time I do not have rational or documentation they will accept to have this appealed. Unfortunately at this time it will not be covered without up to date information, Do you want to have her come in for an appointment (or do virtual with lab visit) so we can do labs and will then have an up to date appointment for rational?

## 2022-05-15 NOTE — Telephone Encounter (Signed)
Her highest A1C was 6.5%  If that doesn't qualify then we will just have to tell pt that her medication was refused.  Thank you for all of your efforts

## 2022-05-16 ENCOUNTER — Ambulatory Visit: Payer: BC Managed Care – PPO | Admitting: Neurology

## 2022-05-16 NOTE — Telephone Encounter (Addendum)
Checked CMM today and PA was cancelled in the system for some reason, I have now re-submitted this again

## 2022-05-17 NOTE — Telephone Encounter (Signed)
PA was again denied for the Ozempic despite information from pts insurance, they did state Victoza and Trulicity are preferred on the patients current formulary  Please advise

## 2022-05-17 NOTE — Telephone Encounter (Signed)
Pt notes trulicity would be her preference at this time to try next

## 2022-05-17 NOTE — Addendum Note (Signed)
Addended by: Star Age on: 05/17/2022 04:34 PM   Modules accepted: Orders

## 2022-05-17 NOTE — Procedures (Signed)
[facility] POLYSOMNOGRAPHY  INTERPRETATION REPORT   STUDY DATE:  05/13/2022     PATIENT NAME:  Emma Wagner         DATE OF BIRTH:  [birth year]  PATIENT ID:  [id number]    TYPE OF STUDY:  PSG  READING PHYSICIAN: Star Age, MD, PhD REFERRED BY: Dr. Carlota Raspberry SCORING TECHNICIAN: Richard Miu, RPSGT  HISTORY:  51 year old female with an underlying medical history of reflux disease, migraine headaches, dizziness, anxiety, depression, borderline diabetes, history of kidney stones, arthritis, and obesity, who reports snoring and excessive daytime somnolence as well as morning headaches. Her Epworth sleepiness score is 9 out of 24, fatigue severity score is 46 out of 63. Height: 67 in Weight: 232 lb (BMI 36) Neck Size: 16 in  MEDICATIONS: Norvasc, Wellbutrin XL, Tylenol PM, Prozac, Advil, Protonix, Ozempic, Melodie Bouillon TECHNICAL DESCRIPTION: A registered sleep technologist was in attendance for the duration of the recording.  Data collection, scoring, video monitoring, and reporting were performed in compliance with the AASM Manual for the Scoring of Sleep and Associated Events; (Hypopnea is scored based on the criteria listed in Section VIII D. 1b in the AASM Manual V2.6 using a 4% oxygen desaturation rule or Hypopnea is scored based on the criteria listed in Section VIII D. 1a in the AASM Manual V2.6 using 3% oxygen desaturation and /or arousal rule).   SLEEP CONTINUITY AND SLEEP ARCHITECTURE:  the patient took Tylenol PM prior to lights out.  Lights-out was at 21:59: and lights-on at  05:08: with a total recording time of 7 hours and 6.5 minutes. Total sleep time ( TST) was 345.0 minutes with a sleep efficiency at 80.9%.   BODY POSITION:  TST was divided  between the following sleep positions: 48.0% supine;  52.0% lateral;  0% prone. Duration of total sleep and percent of total sleep in their respective position is as follows: supine 165 minutes (48%), non-supine  180 minutes (52%); right 179 minutes (52%), left 00 minutes (0%), and prone 00 minutes (0%).  Total supine REM sleep time was 24 minutes (100% of total REM sleep).  Sleep latency was increased at 62.0 minutes.  REM sleep latency was markedly increased at 338.0 minutes. Of the total sleep time, the percentage of stage N1 sleep was 1.0%, stage N2 sleep was 58%, which is increased, stage N3 sleep was 33.5%, which is increased and REM sleep was 7.1%, which is reduced.  There were 1 Stage R periods observed on this study night, 19 awakenings (i.e. transitions to Stage W from any sleep stage), and 57 total stage transitions. Wake after sleep onset (WASO) time accounted for 19.5 minutes with mild sleep fragmentation noted.  RESPIRATORY MONITORING:   Based on CMS criteria (using a 4% oxygen desaturation rule for scoring hypopneas), there were 3 apneas (3 obstructive; 0 central; 0 mixed), and 173 hypopneas.  Apnea index was 0.5. Hypopnea index was 30.1. The apnea-hypopnea index was 30.6 overall (55.5 supine, 0 non-supine; 61.2 REM, 61.2 supine REM).  There were 0 respiratory effort-related arousals (RERAs).  The RERA index was 0 events/h. Total respiratory disturbance index (RDI) was 30.6 events/h. RDI results showed: supine RDI  55.5 /h; non-supine RDI 7.7 /h; REM RDI 61.2 /h, supine REM RDI 61.2 /h.   Based on AASM criteria (using a 3% oxygen desaturation and /or arousal rule for scoring hypopneas), there were 3 apneas (3 obstructive; 0 central; 0 mixed), and 209 hypopneas. Apnea index was 0.5. Hypopnea index was 36.3.  The apnea-hypopnea index was 36.9/hour overall (64.2/hour supine, 0 non-supine; 68.6/hour REM, 68.6 supine REM).  There were 0 respiratory effort-related arousals (RERAs).  The RERA index was 0 events/h. Total respiratory disturbance index (RDI) was 36.9 events/h. RDI results showed: supine RDI  64.2 /h; non-supine RDI 11.7 /h; REM RDI 68.6 /h, supine REM RDI 68.6 /h.  OXIMETRY: Oxyhemoglobin  Saturation Nadir during sleep was at  79%) from a mean of 93%.  Of the Total sleep time (TST)   hypoxemia (=<88%) was present for  6.8 minutes, or 2.0% of total sleep time.  LIMB MOVEMENTS: There were 0 periodic limb movements of sleep (0.0/hr), of which 0 (0.0/hr) were associated with an arousal. AROUSAL: There were 65 arousals in total, for an arousal index of 11 arousals/hour.  Of these, 47 were identified as respiratory-related arousals (8 /h), 0 were PLM-related arousals (0 /h), and 27 were non-specific arousals (5 /h). There were 0 occurrences of Cheyne Stokes breathing.  Snoring was classified as mild to moderate.  EEG:  The EEG was of normal amplitude and frequency, with symmetric manifestation of sleep stages. EKG: The electrocardiogram showed normal sinus rhythm. The average heart rate during sleep was 73 bpm.  The heart rate during sleep varied between a minimum of N/A and  a maximum of  85 bpm. AUDIO and VIDEO: The video and audio analysis did not show any abnormal or unusual behaviors, movements, phonations or vocalizations. The patient took no bathroom breaks.  Post study, the patient indicated, that sleep was the same as usual.  IMPRESSION:  1. Obstructive sleep apnea (OSA) 2. Dysfunctions associated with sleep stages or arousal from sleep  RECOMMENDATIONS: 1. This study demonstrates severe obstructive sleep apnea, with a total AHI of 36.9/hour, REM AHI of 68.6/hour, supine AHI of 64.2/hour and O2 nadir of 79%. Treatment with positive airway pressure in the form of CPAP is recommended. This will require a full night titration study to optimize therapy settings, mask fit, monitoring of tolerance and of proper oxygen saturations. Other treatment options may be limited, and may include (generally speaking) surgical options in selected patients or the use of an oral appliance in certain patients. Concomitant weight loss is recommended. Please note that untreated obstructive sleep apnea may  carry additional perioperative morbidity. Patients with significant obstructive sleep apnea should receive perioperative PAP therapy and the surgeons and particularly the anesthesiologist should be informed of the diagnosis and the severity of the sleep disordered breathing. 2. This study shows sleep fragmentation and abnormal sleep stage percentages; these are nonspecific findings and per se do not signify an intrinsic sleep disorder or a cause for the patient's sleep-related symptoms. Causes include (but are not limited to) the first night effect of the sleep study, circadian rhythm disturbances, medication effect or an underlying mood disorder or medical problem. 3. The patient should be cautioned not to drive, work at heights, or operate dangerous or heavy equipment when tired or sleepy. Review and reiteration of good sleep hygiene measures should be pursued with any patient. 4. The patient will be seen in follow-up in the sleep clinic at [facility] for discussion of the test results, symptom and treatment compliance review, further management strategies, etc. The patient and her referring provider will be notified of the test results.   I certify that I have reviewed the entire raw data recording prior to the issuance of this report in accordance with the Standards of Accreditation of the American Academy of Sleep Medicine (AASM).  Star Age,  MD, PhD [facility] ([place])

## 2022-05-17 NOTE — Telephone Encounter (Signed)
Please let pt know that Ozempic was denied again despite our best attempts.  If she is interested, we can try and switch to Victoza or Trulicity but this is up to her.

## 2022-05-21 ENCOUNTER — Telehealth: Payer: Self-pay

## 2022-05-21 DIAGNOSIS — G4733 Obstructive sleep apnea (adult) (pediatric): Secondary | ICD-10-CM

## 2022-05-21 MED ORDER — TRULICITY 0.75 MG/0.5ML ~~LOC~~ SOAJ: 0.7500 mg | SUBCUTANEOUS | 1 refills | Status: DC

## 2022-05-21 NOTE — Telephone Encounter (Signed)
I called pt. I had an extended conversation with her regarding these results lasting greater than 9 minutes. I advised pt that Dr. Rexene Alberts reviewed their sleep study results and found that patient has severe osa and recommends that pt be treated with a cpap. Dr. Rexene Alberts recommends that pt return for a repeat sleep study in order to properly titrate the cpap and ensure a good mask fit. Pt is agreeable to returning for a titration study. I advised pt that our sleep lab will file with pt's insurance and call pt to schedule the sleep study when we hear back from the pt's insurance regarding coverage of this sleep study. Pt verbalized understanding of results. Pt had no questions at this time but was encouraged to call back if questions arise.

## 2022-05-21 NOTE — Addendum Note (Signed)
Addended by: Midge Minium on: 05/21/2022 03:50 PM   Modules accepted: Orders

## 2022-05-21 NOTE — Telephone Encounter (Signed)
-----   Message from Star Age, MD sent at 05/17/2022  4:33 PM EDT ----- Patient referred by Dr. Carlota Raspberry, seen by me on 05/30/21, diagnostic PSG on 05/13/22.   Please call and notify the patient that the recent sleep study showed severe obstructive sleep apnea. I recommend treatment for this in the form of CPAP. This will require a repeat sleep study for proper titration and mask fitting and correct monitoring of the oxygen saturations. Please explain to patient. I have placed an order in the chart. Thanks.  Star Age, MD, PhD [facility])

## 2022-05-21 NOTE — Telephone Encounter (Signed)
Prescription for Trulicity sent to pharmacy

## 2022-05-22 ENCOUNTER — Telehealth: Payer: Self-pay | Admitting: Family Medicine

## 2022-05-22 NOTE — Telephone Encounter (Signed)
Caller name: Latifah  On DPR? :yes/no: Yes  Call back number: [phone]  Provider they see: Birdie Riddle  Reason for call:  calling b/c had a f/u from an MRI with Dr. Berenice Primas to go over the results and stated that there was a cyst on rt kidney and needed to f/u with Dr. Birdie Riddle.  Pt has appointment 10/9, but wonding if she needs to come sooner.

## 2022-05-22 NOTE — Addendum Note (Signed)
Addended by: Star Age on: 05/22/2022 05:28 PM   Modules accepted: Orders

## 2022-05-22 NOTE — Telephone Encounter (Signed)
Spoke w/ pt and she states Dr Berenice Primas did go over results but he wanted her to see Dr Birdie Riddle to see what she wants the pt to do due to the cyst on the right kidney . I informed pt that Dr Birdie Riddle is full I did advise her to keep her apt on Monday . Pt states  if she needs a referral to a kidney Dr she prefers [place] area first

## 2022-05-23 NOTE — Telephone Encounter (Signed)
An appt Monday will be fine.  Thankfully most cysts are just that- cysts- and rarely do they cause problems.  Of course there are always outliers and that's what we'll make sure of when we discuss on Monday.

## 2022-05-23 NOTE — Telephone Encounter (Signed)
Called patient to let her know the rx had been sent to her pharmacy. She states that she received it

## 2022-05-23 NOTE — Telephone Encounter (Signed)
I did advise her to keep her apt on Monday she was very understanding

## 2022-05-23 NOTE — Telephone Encounter (Signed)
I spoke with the patient we discussed that her insurance will not approve a titration study and requires the patient to try AutoPap therapy first.  We discussed the process of getting started and the insurance compliance requirements which includes using the machine at least 4 hours at night and also being seen in the office between 30 and 90 days after set up.  Discussed DME, will use [facility].  Patient's questions were answered.  She will watch for a call from the DME company but I will also send her a letter that has their phone number in there if needed.  The patient scheduled an initial follow-up appointment for Thursday, August 02 2022 at 9:45 AM.  Autopap referral faxed to [place]. Received a receipt of confirmation. Follow-up letter sent to pt via mychart.

## 2022-05-25 ENCOUNTER — Other Ambulatory Visit: Payer: Self-pay | Admitting: Family Medicine

## 2022-05-25 DIAGNOSIS — R109 Unspecified abdominal pain: Secondary | ICD-10-CM

## 2022-05-25 DIAGNOSIS — Z87442 Personal history of urinary calculi: Secondary | ICD-10-CM

## 2022-05-28 ENCOUNTER — Ambulatory Visit: Payer: BC Managed Care – PPO | Admitting: Family Medicine

## 2022-05-28 ENCOUNTER — Encounter: Payer: Self-pay | Admitting: Family Medicine

## 2022-05-28 VITALS — BP 126/78 | HR 79 | Temp 97.6°F | Resp 16 | Ht 66.0 in | Wt 228.2 lb

## 2022-05-28 DIAGNOSIS — I10 Essential (primary) hypertension: Secondary | ICD-10-CM

## 2022-05-28 DIAGNOSIS — E669 Obesity, unspecified: Secondary | ICD-10-CM | POA: Diagnosis not present

## 2022-05-28 DIAGNOSIS — N281 Cyst of kidney, acquired: Secondary | ICD-10-CM | POA: Diagnosis not present

## 2022-05-28 DIAGNOSIS — N2889 Other specified disorders of kidney and ureter: Secondary | ICD-10-CM

## 2022-05-28 DIAGNOSIS — Z23 Encounter for immunization: Secondary | ICD-10-CM | POA: Diagnosis not present

## 2022-05-28 LAB — BASIC METABOLIC PANEL
BUN: 13 mg/dL (ref 6–23)
CO2: 28 mEq/L (ref 19–32)
Calcium: 9.5 mg/dL (ref 8.4–10.5)
Chloride: 104 mEq/L (ref 96–112)
Creatinine, Ser: 0.96 mg/dL (ref 0.40–1.20)
GFR: 68.52 mL/min (ref >60.00)
Glucose, Bld: 75 mg/dL (ref 70–99)
Potassium: 4.1 mEq/L (ref 3.5–5.1)
Sodium: 141 mEq/L (ref 135–145)

## 2022-05-28 LAB — CBC WITH DIFFERENTIAL/PLATELET
Basophils Absolute: 0.1 10*3/uL (ref 0.0–0.1)
Basophils Relative: 1 % (ref 0.0–3.0)
Eosinophils Absolute: 0.1 10*3/uL (ref 0.0–0.7)
Eosinophils Relative: 1.1 % (ref 0.0–5.0)
HCT: 42 % (ref 36.0–46.0)
Hemoglobin: 13.5 g/dL (ref 12.0–15.0)
Lymphocytes Relative: 29.4 % (ref 12.0–46.0)
Lymphs Abs: 3.1 10*3/uL (ref 0.7–4.0)
MCHC: 32.1 g/dL (ref 30.0–36.0)
MCV: 85.1 fl (ref 78.0–100.0)
Monocytes Absolute: 0.6 10*3/uL (ref 0.1–1.0)
Monocytes Relative: 5.6 % (ref 3.0–12.0)
Neutro Abs: 6.6 10*3/uL (ref 1.4–7.7)
Neutrophils Relative %: 62.9 % (ref 43.0–77.0)
Platelets: 346 10*3/uL (ref 150.0–400.0)
RBC: 4.93 Mil/uL (ref 3.87–5.11)
RDW: 18.8 % — ABNORMAL HIGH (ref 11.5–15.5)
WBC: 10.6 10*3/uL — ABNORMAL HIGH (ref 4.0–10.5)

## 2022-05-28 LAB — LIPID PANEL
Cholesterol: 166 mg/dL (ref 0–200)
HDL: 48.2 mg/dL (ref >39.00)
LDL Cholesterol: 79 mg/dL (ref 0–99)
NonHDL: 117.6
Total CHOL/HDL Ratio: 3
Triglycerides: 191 mg/dL — ABNORMAL HIGH (ref 0.0–149.0)
VLDL: 38.2 mg/dL (ref 0.0–40.0)

## 2022-05-28 LAB — HEPATIC FUNCTION PANEL
ALT: 13 U/L (ref 0–35)
AST: 15 U/L (ref 0–37)
Albumin: 4.1 g/dL (ref 3.5–5.2)
Alkaline Phosphatase: 51 U/L (ref 39–117)
Bilirubin, Direct: 0.1 mg/dL (ref 0.0–0.3)
Total Bilirubin: 0.3 mg/dL (ref 0.2–1.2)
Total Protein: 7.4 g/dL (ref 6.0–8.3)

## 2022-05-28 LAB — HEMOGLOBIN A1C: Hgb A1c MFr Bld: 6 % (ref 4.6–6.5)

## 2022-05-28 LAB — TSH: TSH: 1.37 u[IU]/mL (ref 0.35–5.50)

## 2022-05-28 NOTE — Progress Notes (Signed)
   Subjective:    Patient ID: Emma Wagner, female    DOB: [birth year], 51 y.o.   MRN: [id number]  HPI R renal cyst- seen on MRI done 9/27, R lower pole.  MRI reports does not characterize size or type of cyst.  HTN- chronic problem, on Amlodipine 2.'5mg'$  daily w/ adequate control.  No CP, SOB, HAs, visual changes, edema.  Obesity- pt's BMI 36.83  Currently on Trulicity 0.'75mg'$  weekly.  Pt recently dx'd w/ OSA- waiting on CPAP machine.  Pt is considering weight loss surgery.   Review of Systems For ROS see HPI     Objective:   Physical Exam Vitals reviewed.  Constitutional:      General: She is not in acute distress.    Appearance: Normal appearance. She is well-developed. She is obese. She is not ill-appearing.  HENT:     Head: Normocephalic and atraumatic.  Eyes:     Conjunctiva/sclera: Conjunctivae normal.     Pupils: Pupils are equal, round, and reactive to light.  Neck:     Thyroid: No thyromegaly.  Cardiovascular:     Rate and Rhythm: Normal rate and regular rhythm.     Pulses: Normal pulses.     Heart sounds: Normal heart sounds. No murmur heard. Pulmonary:     Effort: Pulmonary effort is normal. No respiratory distress.     Breath sounds: Normal breath sounds.  Abdominal:     General: There is no distension.     Palpations: Abdomen is soft.     Tenderness: There is no abdominal tenderness.  Musculoskeletal:     Cervical back: Normal range of motion and neck supple.     Right lower leg: No edema.     Left lower leg: No edema.  Lymphadenopathy:     Cervical: No cervical adenopathy.  Skin:    General: Skin is warm and dry.  Neurological:     General: No focal deficit present.     Mental Status: She is alert and oriented to person, place, and time.  Psychiatric:        Mood and Affect: Mood normal.        Behavior: Behavior normal.           Assessment & Plan:

## 2022-05-28 NOTE — Patient Instructions (Signed)
Schedule your complete physical in 3 months We'll notify you of your lab results and make any changes if needed We'll call you to schedule your CT scan We'll call you to schedule your surgery appt Continue to work on low carb diet and regular physical activity as able Call with any questions or concerns Stay Safe!  Stay Healthy! Hang in there!!

## 2022-05-29 DIAGNOSIS — N281 Cyst of kidney, acquired: Secondary | ICD-10-CM | POA: Insufficient documentation

## 2022-05-29 NOTE — Assessment & Plan Note (Signed)
Chronic problem.  Currently on Amlodipine 2.'5mg'$  daily w/ adequate control.  Asymptomatic.  No med changes at this time

## 2022-05-29 NOTE — Assessment & Plan Note (Signed)
Pt's BMI 36.83.  Currently on Trulicity 0.'75mg'$  weekly and will increase at the end of the month.  She is waiting on her CPAP to tx her recently dx'd OSA.  This can also help w/ weight loss.  She is considering weight loss surgery as this is covered by insurance.  Will refer to surgery for more information.

## 2022-05-29 NOTE — Assessment & Plan Note (Signed)
New.  Noted on MRI done to assess her spine.  Given that they did not characterize the cyst or provide a size, will get CT to better evaluate.  Pt expressed understanding and is in agreement w/ plan.

## 2022-05-29 NOTE — Progress Notes (Signed)
Informed pt of results.

## 2022-06-01 ENCOUNTER — Encounter: Payer: Self-pay | Admitting: Family Medicine

## 2022-06-01 NOTE — Telephone Encounter (Signed)
Okay to supply rational? Co morbidities include prediabetes and hypertension?  Last 5 years weights: 05/28/22 228lb 11/02/21 228lb 06/23/21 224lb 03/20/21 234lb 05/10/20 227lb 10/20/19 189lb 09/18/19 192lb 03/04/19 192lb 11/21/18 230lb 05/30/16 193lb

## 2022-06-04 NOTE — Telephone Encounter (Signed)
I spoke with Emma Wagner @ [facility]. The October 2022 office note is too old for them to use. Need a more recent visit, even video, so they can add to sleep study for authorization.

## 2022-06-05 DIAGNOSIS — D3502 Benign neoplasm of left adrenal gland: Secondary | ICD-10-CM | POA: Diagnosis not present

## 2022-06-05 DIAGNOSIS — N281 Cyst of kidney, acquired: Secondary | ICD-10-CM | POA: Diagnosis not present

## 2022-06-06 ENCOUNTER — Telehealth: Payer: Self-pay | Admitting: Neurology

## 2022-06-06 ENCOUNTER — Encounter: Payer: Self-pay | Admitting: Adult Health

## 2022-06-06 ENCOUNTER — Telehealth (INDEPENDENT_AMBULATORY_CARE_PROVIDER_SITE_OTHER): Payer: BC Managed Care – PPO | Admitting: Adult Health

## 2022-06-06 DIAGNOSIS — G4733 Obstructive sleep apnea (adult) (pediatric): Secondary | ICD-10-CM

## 2022-06-06 NOTE — Telephone Encounter (Signed)
LVM reminder MyChart Video Visit today 2:15 pm with Janett Billow, NP.

## 2022-06-06 NOTE — Progress Notes (Signed)
[facility] [address] [place]. [place] [address] [phone]       OFFICE FOLLOW UP NOTE  [name] Date of Birth:  1970-12-12 Medical Record Number:  [id number]   Reason for visit: Sleep apnea follow-up visit  Virtual Visit via Video Note  I connected with Emma Wagner on 06/06/22 at  2:15 PM EDT by a video enabled telemedicine application and verified that I am speaking with the correct person using two identifiers.  Location: Patient: Home Provider: Office   I discussed the limitations of evaluation and management by telemedicine and the availability of in person appointments. The patient expressed understanding and agreed to proceed.    SUBJECTIVE:    HPI:   Update 06/06/2022 JM: Patient returns for sleep apnea visit via [place] video visit.  Completed sleep study 9/24 which showed severe OSA with total AHI of 36.9/h.  Insurance required visit today in order to get started with CPAP.  She has no questions or concerns at this time.      Consult visit 05/30/2021 Dr. Rexene Alberts: Ms. Wiechman is a 51 year old right-handed woman with an underlying medical history of reflux disease, migraine headaches and dizziness (followed by Dr. Tomi Likens at [facility] neurology), anxiety, depression, borderline diabetes, history of kidney stones, arthritis, and obesity, who reports snoring and excessive daytime somnolence as well as morning headaches.  I reviewed your office note from 03/20/2021.  Her Epworth sleepiness score is 9 out of 24, fatigue severity score is 46 out of 63. She has occasionally woken up with a sense of gasping for air.  Her snoring has fluctuated with her weight.  She has had trouble losing weight.  She has recently started Ozempic.  She lives with her "husband"/partner of 23 years.  She has a 51 year old daughter and also a 23 year old daughter who is on her own.  They have 3 dogs in the household.  She does have a TV in her bedroom but does not care  for it at night, prefers a dark and quiet in her bedroom.  She goes to bed generally between 9 and 10 PM and rise time is around 7 AM.  She has difficulty initiating and maintaining sleep.  Her sister and mom have insomnia.  No obvious family history of obstructive sleep apnea.  The patient has taken over-the-counter medication such as ZzzQuil or Tylenol PM at night for sleep.  She has tried melatonin which did not help.  She drinks caffeine in the form of coffee, currently 1 cup/day.  She does not drink alcohol regularly, occasionally.  She quit smoking in 2013.  She currently works as a Therapist, occupational at [employer].  She previously worked for [employer] for 17 years.  She has a history of morning headaches.  She has nocturia about once per average night.    ROS:   14 system review of systems performed and negative with exception of those listed in HPI  PMH:  Past Medical History:  Diagnosis Date   Anxiety    add also   Arthritis    Borderline type 2 diabetes mellitus since 2013   -lost weight and quit smoking and is no longer PRE diabetic   Bursitis    bilateral hips, more on right hip   Chronic kidney disease    h/o kidney stone 2016   Constipation    Depression    Frequent urination    while kidney stone evident   GERD (gastroesophageal reflux disease)  Headache    "every blue moon. 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   Procedure: TOTAL HIP ARTHROPLASTY ANTERIOR APPROACH;  Surgeon: Frederik Pear, MD;  Location: [place];  Service: Orthopedics;  Laterality: Left;   TOTAL HIP ARTHROPLASTY Right 06/13/2016   Procedure: TOTAL HIP ARTHROPLASTY ANTERIOR APPROACH;   Surgeon: Frederik Pear, MD;  Location: [place];  Service: Orthopedics;  Laterality: Right;   TUBAL LIGATION  2009   wisdom teth      Social History:  Social History   Socioeconomic History   Marital status: Single    Spouse name: Not on file   Number of children: 2   Years of education: 12   Highest education level: 12th grade  Occupational History   Occupation: unemployed  Tobacco Use   Smoking status: Former    Packs/day: 1.00    Years: 26.00    Total pack years: 26.00    Types: Cigarettes    Quit date: 07/20/2012    Years since quitting: 9.8   Smokeless tobacco: Never  Vaping Use   Vaping Use: Never used  Substance and Sexual Activity   Alcohol use: Yes    Comment: occasional   Drug use: No   Sexual activity: Yes    Birth control/protection: None  Other Topics Concern   Not on file  Social History Narrative   Patient is right-handed. She lives in a 2 level home. Drinks one cup of coffee some days. She walks daily.   Social Determinants of Health   Financial Resource Strain: Not on file  Food Insecurity: Not on file  Transportation Needs: Not on file  Physical Activity: Not on file  Stress: Not on file  Social Connections: Not on file  Intimate Partner Violence: Not on file    Family History:  Family History  Problem Relation Age of Onset   Arthritis Mother    Hypertension Mother    Prostate cancer Father    Skin cancer Father    Congestive Heart Failure Father    Hypertension Father    Snoring Father    Heart failure Other    Hypertension Other    Cancer Other    Anxiety disorder Other    Depression Other    Insomnia Other     Medications:   Current Outpatient Medications on File Prior to Visit  Medication Sig Dispense Refill   amLODipine (NORVASC) 2.5 MG tablet TAKE 1 TABLET BY MOUTH EVERY DAY 90 tablet 1   Dulaglutide (TRULICITY) 7.84 ON/6.2XB SOPN Inject 0.75 mg into the skin once a week. 2 mL 1   famotidine (PEPCID) 40 MG tablet TAKE 1 TABLET  BY MOUTH EVERYDAY AT BEDTIME 90 tablet 1   ibuprofen (ADVIL,MOTRIN) 800 MG tablet ibuprofen 800 mg tablet  TAKE 1 TABLET 3 TIMES A DAY AS NEEDED FOR PAIN     methocarbamol (ROBAXIN) 500 MG tablet Take 1 tablet (500 mg total) by mouth every 8 (eight) hours as needed for muscle spasms. 45 tablet 0   ondansetron (ZOFRAN) 4 MG tablet Take 1 tablet (4 mg total) by mouth every 8 (eight) hours as needed for nausea or vomiting.  30 tablet 0   pantoprazole (PROTONIX) 40 MG tablet Take 1 tablet 20 to 30 minutes before breakfast and 1 tablet 20 to 30 minutes before dinner meal. 180 tablet 2   UBRELVY 100 MG TABS TAKE 1 TABLET BY MOUTH AS NEEDED MAY REPEAT IN 2 HOURS IF NEEDED MAX 2 TABLETS IN 24 HOURS 16 tablet 6   ZEMBRACE SYMTOUCH 3 MG/0.5ML SOAJ Inject 3 mg into the skin as needed. 0.5 mL 5   No current facility-administered medications on file prior to visit.    Allergies:  No Known Allergies    OBJECTIVE:  N/A d/t visit type       ASSESSMENT/PLAN: Emma Wagner is a 51 y.o. year old female     OSA: Recent sleep study confirmed severe sleep apnea.  Insurance requires today's visit in order to obtain CPAP machine. Order already placed for new machine. She is already set up with f/u visit with Dr. Rexene Alberts on 12/14. Advised to call office ot reschedule if she does not receive her machine in the next 30 days.        CC:  PCP: Midge Minium, MD    I spent 10 minutes of face-to-face and non-face-to-face time with patient via MyChart video visit.  This included previsit chart review, study review, electronic health record documentation, patient education regarding diagnosis of sleep apnea and getting set up with CPAP and answered all the questions to patient satisfaction   Frann Rider, [facility]  [facility] [address] [place], [place] [address]  Phone [phone] Fax [phone] Note: This document was prepared with digital dictation and  possible smart phrase technology. Any transcriptional errors that result from this process are unintentional.

## 2022-06-06 NOTE — Progress Notes (Signed)
Per NP , faxed today's office visit notes to [facility] for new CPAP orders. Received confirmation.

## 2022-06-08 NOTE — Telephone Encounter (Signed)
Requesting results from [facility] for patients CT scan, pt is asking for comment on this as well as the letter of rational from last week

## 2022-06-13 ENCOUNTER — Telehealth: Payer: Self-pay | Admitting: Adult Health

## 2022-06-13 ENCOUNTER — Encounter: Payer: Self-pay | Admitting: *Deleted

## 2022-06-13 NOTE — Telephone Encounter (Signed)
Pt states [facility] is telling her that they are still waiting for her sleep study notes

## 2022-06-13 NOTE — Telephone Encounter (Signed)
Faxed sleep study results and recent office note to [place], received confirmation.

## 2022-06-18 ENCOUNTER — Other Ambulatory Visit: Payer: Self-pay | Admitting: Family Medicine

## 2022-06-18 DIAGNOSIS — I10 Essential (primary) hypertension: Secondary | ICD-10-CM

## 2022-06-25 DIAGNOSIS — Z1231 Encounter for screening mammogram for malignant neoplasm of breast: Secondary | ICD-10-CM | POA: Diagnosis not present

## 2022-06-25 DIAGNOSIS — Z130 Encounter for screening for diseases of the blood and blood-forming organs and certain disorders involving the immune mechanism: Secondary | ICD-10-CM | POA: Diagnosis not present

## 2022-06-25 DIAGNOSIS — Z01419 Encounter for gynecological examination (general) (routine) without abnormal findings: Secondary | ICD-10-CM | POA: Diagnosis not present

## 2022-06-25 DIAGNOSIS — Z1272 Encounter for screening for malignant neoplasm of vagina: Secondary | ICD-10-CM | POA: Diagnosis not present

## 2022-06-25 DIAGNOSIS — Z124 Encounter for screening for malignant neoplasm of cervix: Secondary | ICD-10-CM | POA: Diagnosis not present

## 2022-06-25 DIAGNOSIS — Z1151 Encounter for screening for human papillomavirus (HPV): Secondary | ICD-10-CM | POA: Diagnosis not present

## 2022-06-25 LAB — HM PAP SMEAR

## 2022-06-26 ENCOUNTER — Encounter: Payer: Self-pay | Admitting: Family Medicine

## 2022-06-26 LAB — HM MAMMOGRAPHY

## 2022-06-28 DIAGNOSIS — K219 Gastro-esophageal reflux disease without esophagitis: Secondary | ICD-10-CM | POA: Diagnosis not present

## 2022-06-28 DIAGNOSIS — I10 Essential (primary) hypertension: Secondary | ICD-10-CM | POA: Diagnosis not present

## 2022-06-28 DIAGNOSIS — E669 Obesity, unspecified: Secondary | ICD-10-CM | POA: Diagnosis not present

## 2022-06-28 DIAGNOSIS — G4733 Obstructive sleep apnea (adult) (pediatric): Secondary | ICD-10-CM | POA: Diagnosis not present

## 2022-06-29 ENCOUNTER — Other Ambulatory Visit (HOSPITAL_COMMUNITY): Payer: Self-pay | Admitting: Surgery

## 2022-06-29 DIAGNOSIS — E66812 Obesity, class 2: Secondary | ICD-10-CM

## 2022-06-29 DIAGNOSIS — Z01818 Encounter for other preprocedural examination: Secondary | ICD-10-CM

## 2022-06-29 DIAGNOSIS — E669 Obesity, unspecified: Secondary | ICD-10-CM

## 2022-06-30 ENCOUNTER — Encounter: Payer: Self-pay | Admitting: Neurology

## 2022-07-02 ENCOUNTER — Other Ambulatory Visit: Payer: Self-pay

## 2022-07-02 ENCOUNTER — Ambulatory Visit (HOSPITAL_COMMUNITY)
Admission: RE | Admit: 2022-07-02 | Discharge: 2022-07-02 | Disposition: A | Discharge status: Home / Self Care | Payer: BC Managed Care – PPO | Source: Ambulatory Visit | Attending: Surgery | Admitting: Surgery

## 2022-07-02 DIAGNOSIS — E669 Obesity, unspecified: Secondary | ICD-10-CM | POA: Diagnosis not present

## 2022-07-02 DIAGNOSIS — Z01818 Encounter for other preprocedural examination: Secondary | ICD-10-CM | POA: Insufficient documentation

## 2022-07-03 ENCOUNTER — Encounter: Payer: BC Managed Care – PPO | Attending: Surgery | Admitting: Dietician

## 2022-07-03 ENCOUNTER — Encounter: Payer: Self-pay | Admitting: Dietician

## 2022-07-03 VITALS — Ht 66.0 in | Wt 235.7 lb

## 2022-07-03 DIAGNOSIS — E669 Obesity, unspecified: Secondary | ICD-10-CM | POA: Insufficient documentation

## 2022-07-03 NOTE — Progress Notes (Signed)
Nutrition Assessment for Bariatric Surgery Medical Nutrition Therapy Appt Start Time: 2:06    End Time: 3:15  Patient was seen on 07/03/2022 for Pre-Operative Nutrition Assessment. Letter of approval faxed to [facility] bariatric surgery program coordinator on 07/03/2022.   Referral stated Supervised Weight Loss (SWL) visits needed: 6 months  Planned surgery: RYGB   NUTRITION ASSESSMENT   Anthropometrics  Start weight at [facility]: 235.7 lbs (date: 07/03/2022)  Height: 66 in BMI: 38.04 kg/m2     Clinical  Medical hx: GERD, HTN, obesity, sleep apnea, depression, anxiety, arthritis, history of kidney stones Medications: Bupropion, Robaxin, famotidine, amlodipine besylate, pantoprazole SOD, fusion, vilazodone, ubrelvy, zembrace, Trulicity   Labs: Lipid Panel     Component Value Date/Time   CHOL 166 05/28/2022 1146   TRIG 191.0 (H) 05/28/2022 1146   HDL 48.20 05/28/2022 1146   CHOLHDL 3 05/28/2022 1146   VLDL 38.2 05/28/2022 1146   LDLCALC 79 05/28/2022 1146    Lab Results  Component Value Date   HGBA1C 6.0 05/28/2022    Notable signs/symptoms: none noted Any previous deficiencies? No  Micronutrient Nutrition Focused Physical Exam: Hair: No issues observed Eyes: No issues observed Mouth: No issues observed Neck: No issues observed Nails: No issues observed Skin: No issues observed  Lifestyle & Dietary Hx  Patient lives with significant other and 61 year old daughter. The pt performs the food shopping and the pt's husband prepares the meals. She reports that she typically skips or misses 10-14 out of 21 possible meals per week. She may have 3 meals per week that are take-out or at a restaurant. Patient works as Teacher, English as a foreign language at [employer]. She denies binge eating though has felt shame and/or guilt after eating too much food.  She denies having used laxatives or vomiting to facilitate weight loss. She admits to emotional eating during times of  stress. She states that she knows the difference between hunger and thirst and can tell when she is full. Pt states she has used a pill to help move food through faster, stating it was not a laxative, it was from a program she was on. Pt states when she is stressed she might eat more, but states when she is sad she does not want to eat. Pt states she can tell when she has overeaten, stating she can be satisfied but still eat.  Physical Activity: ADLs (walking at work)  Current Patient Perceived Stress Level as stated by pt on a scale of 1-10:  4-6      Stress Management Techniques: meditation, breathing techniques  Fruit servings per week: 4 Non starchy vegetable servings per week: 3 Whole Grains per week: 1  24-Hr Dietary Recall First Meal: skip coffee Snack: nutrigrain bar Second Meal: skip or Healthy Choice meal Snack: fruit or salad Third Meal: Spaghetti or mac and cheese or roast and vegetables or salmon or baked chicken Snack: little debbie or ice cream Beverages: zero sugar energy drink, coffee  Alcoholic beverages per week: 0-1   Estimated Energy Needs Calories: 1500  NUTRITION DIAGNOSIS  Overweight/obesity ([place]-3.3) related to past poor dietary habits and physical inactivity as evidenced by patient w/ planned RYGB surgery following dietary guidelines for continued weight loss.    NUTRITION INTERVENTION  Nutrition counseling (C-1) and education (E-2) to facilitate bariatric surgery goals.  Educated pt on micronutrient deficiencies post surgery and strategies to mitigate that risk   Pre-Op Goals Reviewed with the Patient Track food and beverage intake (pen and paper,  MyFitness Pal, Baritastic app, etc.) Make healthy food choices while monitoring portion sizes Consume 3 meals per day or try to eat every 3-5 hours Avoid concentrated sugars and fried foods Keep sugar & fat in the single digits per serving on food labels Practice CHEWING your food (aim for applesauce  consistency) Practice not drinking 15 minutes before, during, and 30 minutes after each meal and snack Avoid all carbonated beverages (ex: soda, sparkling beverages)  Limit caffeinated beverages (ex: coffee, tea, energy drinks) Avoid all sugar-sweetened beverages (ex: regular soda, sports drinks)  Avoid alcohol  Aim for 64-100 ounces of FLUID daily (with at least half of fluid intake being plain water)  Aim for at least 60-80 grams of PROTEIN daily Look for a liquid protein source that contains ?15 g protein and ?5 g carbohydrate (ex: shakes, drinks, shots) Make a list of non-food related activities Physical activity is an important part of a healthy lifestyle so keep it moving! The goal is to reach 150 minutes of exercise per week, including cardiovascular and weight baring activity.  *Goals that are bolded indicate the pt would like to start working towards these  Handouts Provided Include  Bariatric Surgery handouts (Nutrition Visits, Pre-Op Goals, Protein Shakes, Vitamins & Minerals)  Learning Style & Readiness for Change Teaching method utilized: Visual & Auditory  Demonstrated degree of understanding via: Teach Back  Readiness Level: preparation Barriers to learning/adherence to lifestyle change: nothing identified  RD's Notes for Next Visit Progress toward patient's chosen goals     MONITORING & EVALUATION Dietary intake, weekly physical activity, body weight, and pre-op goals reached at next nutrition visit.    Next Steps  Patient is to follow up at [place] next month for SWL visit.

## 2022-07-16 ENCOUNTER — Ambulatory Visit: Payer: BC Managed Care – PPO | Admitting: Dietician

## 2022-07-21 ENCOUNTER — Other Ambulatory Visit: Payer: Self-pay | Admitting: Family Medicine

## 2022-07-24 ENCOUNTER — Encounter: Payer: Self-pay | Admitting: Dietician

## 2022-07-24 ENCOUNTER — Encounter: Payer: BC Managed Care – PPO | Attending: Surgery | Admitting: Dietician

## 2022-07-24 VITALS — Ht 66.0 in | Wt 241.0 lb

## 2022-07-24 DIAGNOSIS — E669 Obesity, unspecified: Secondary | ICD-10-CM | POA: Insufficient documentation
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
## 2022-07-28 DIAGNOSIS — G4733 Obstructive sleep apnea (adult) (pediatric): Secondary | ICD-10-CM | POA: Diagnosis not present

## 2022-08-02 ENCOUNTER — Ambulatory Visit (INDEPENDENT_AMBULATORY_CARE_PROVIDER_SITE_OTHER): Payer: BC Managed Care – PPO | Admitting: Neurology

## 2022-08-02 ENCOUNTER — Encounter: Payer: Self-pay | Admitting: Neurology

## 2022-08-02 VITALS — BP 144/101 | HR 95 | Ht 66.0 in | Wt 242.8 lb

## 2022-08-02 DIAGNOSIS — G4733 Obstructive sleep apnea (adult) (pediatric): Secondary | ICD-10-CM

## 2022-08-02 NOTE — Patient Instructions (Addendum)
It was nice to see you again today. I am glad to hear, things are going well with your autoPAP therapy. You have adjusted well to treatment with your machine, and you are compliant with it. You have also fulfilled the insurance-mandated compliance percentage, which is reassuring, so you can get ongoing supplies through your insurance.   Please talk to your DME provider about getting replacement supplies on a regular basis. Please be sure to change your filter every month, your mask about every 3 months, hose about every 6 months, humidifier chamber about yearly. Some restrictions are imposed by your insurance carrier with regard to how frequently you can get certain supplies.  Your DME company can provide further details if necessary.   Please continue using your autoPAP regularly. While your insurance requires that you use PAP at least 4 hours each night on 70% of the nights, I recommend, that you not skip any nights and use it throughout the night if you can. Getting used to PAP and staying with the treatment long term does take time and patience and discipline. Untreated obstructive sleep apnea when it is moderate to severe can have an adverse impact on cardiovascular health and raise her risk for heart disease, arrhythmias, hypertension, congestive heart failure, stroke and diabetes. Untreated obstructive sleep apnea causes sleep disruption, nonrestorative sleep, and sleep deprivation. This can have an impact on your day to day functioning and cause daytime sleepiness and impairment of cognitive function, memory loss, mood disturbance, and problems focussing. Using PAP regularly can improve these symptoms.  We can see you in 1 year, you can see one of our nurse practitioners as you are stable.   Please continue to work on weight loss.  Once you have achieved a significant amount of weight loss, especially if you do go through with bariatric surgery next year, we can consider reassessing your sleep apnea  with a home sleep test.  You can discuss at your next appointment with Janett Billow as well.

## 2022-08-02 NOTE — Progress Notes (Signed)
Subjective:    Patient ID: Emma Wagner is a 51 y.o. female.  HPI    Interim history:  [name] is a 51 year old right-handed woman with an underlying medical history of reflux disease, migraine headaches and dizziness (followed by Dr. Tomi Wagner at [facility] neurology), anxiety, depression, borderline diabetes, history of kidney stones, arthritis, and obesity, who presents for follow-up consultation of her obstructive sleep apnea, after interim testing and starting AutoPap therapy.  The patient is unaccompanied today.  I first met her at the request of her primary care physician on 05/30/2021, at which time she reported snoring and excessive daytime somnolence as well as morning headaches.  She eventually had a baseline sleep study on 05/13/2022 which showed severe obstructive sleep apnea with an AHI of 36.9/h, REM AHI 68.6/h, O2 nadir 79%.  She was advised to proceed with AutoPap set up at home.    She saw Emma Rider, NP in the interim in a video visit on 06/06/2022 for an updated office visit. Her subsequent set up date was 06/28/2022.   Today, 08/02/2022: I reviewed her AutoPap compliance data from 07/01/2022 through 07/30/2022, which is a total of 30 days, during which time she used her machine every night with percent use days greater than 4 hours at 100%, indicating superb compliance with an average usage of 8 hours and 21 minutes which is very good, residual AHI at goal at 3.5/h, 95th percentile pressure 12.4 cm with a range of 6 to 14 cm with EPR of 3, leak acceptable with the 95th percentile at 11.9 L/min.  She reports doing well, she has eventually adjusted to AutoPap therapy but has not had a very telltale response to it, does note that her snoring is much better and her apneas are improved as she wakes up with gasping when she naps without it.  Her husband is very encouraging, he has noticed good response from his side of things, her Epworth sleepiness score is 8 out of 24, previously 9 out  of 24.  She is motivated to continue with treatment but her ultimate goal is to lose weight and see if she still needs to be treated.  She is switched from the nasal cushion interface which caused a sore spot under the nostril on the right side to a nasal mask.  She is considering weight loss surgery next year.  She has met with Dr. Thermon Wagner.   The patient's allergies, current medications, family history, past medical history, past social history, past surgical history and problem list were reviewed and updated as appropriate.   Previously:   05/30/21: (She)reports snoring and excessive daytime somnolence as well as morning headaches.  I reviewed your office note from 03/20/2021.  Her Epworth sleepiness score is 9 out of 24, fatigue severity score is 46 out of 63. She has occasionally woken up with a sense of gasping for air.  Her snoring has fluctuated with her weight.  She has had trouble losing weight.  She has recently started Ozempic.  She lives with her "husband"/partner of 23 years.  She has a 71 year old daughter and also a 5 year old daughter who is on her own.  They have 3 dogs in the household.  She does have a TV in her bedroom but does not care for it at night, prefers a dark and quiet in her bedroom.  She goes to bed generally between 9 and 10 PM and rise time is around 7 AM.  She has difficulty initiating and maintaining sleep.  Her sister and mom have insomnia.  No obvious family history of obstructive sleep apnea.  The patient has taken over-the-counter medication such as ZzzQuil or Tylenol PM at night for sleep.  She has tried melatonin which did not help.  She drinks caffeine in the form of coffee, currently 1 cup/day.  She does not drink alcohol regularly, occasionally.  She quit smoking in 2013.  She currently works as a Therapist, occupational at [employer].  She previously worked for [employer] for 17 years.  She has a history of morning headaches.  She has  nocturia about once per average night.   Her Past Medical History Is Significant For: Past Medical History:  Diagnosis Date   Anxiety    add also   Arthritis    Borderline type 2 diabetes mellitus since 2013   -lost weight and quit smoking and is no longer PRE diabetic   Bursitis    bilateral hips, more on right hip   Chronic kidney disease    h/o kidney stone 2016   Constipation    Depression    Frequent urination    while kidney stone evident   GERD (gastroesophageal reflux disease)    Headache    "every blue moon. 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   Procedure: TOTAL HIP ARTHROPLASTY ANTERIOR APPROACH;  Surgeon: Frederik Pear, MD;  Location: [place];  Service: Orthopedics;  Laterality: Left;   TOTAL HIP ARTHROPLASTY Right 06/13/2016   Procedure: TOTAL HIP ARTHROPLASTY ANTERIOR APPROACH;  Surgeon: Frederik Pear, MD;  Location: [place];  Service: Orthopedics;  Laterality: Right;   TUBAL LIGATION  2009   wisdom teth      Her Family History Is Significant For: Family History  Problem Relation Age of Onset   Arthritis Mother    Hypertension Mother    Sleep apnea Mother    Prostate cancer Father    Skin cancer Father    Congestive Heart Failure Father    Hypertension Father    Snoring Father    Heart failure Other    Hypertension Other    Cancer Other    Anxiety disorder Other    Depression Other    Insomnia Other     Her Social History Is Significant For: Social History   Socioeconomic History   Marital status: Single    Spouse name: Not on file   Number of children: 2   Years of education: 12   Highest education level: 12th grade   Occupational History   Occupation: unemployed  Tobacco Use   Smoking status: Former    Packs/day: 1.00    Years: 26.00    Total pack years: 26.00    Types: Cigarettes    Quit date: 07/20/2012    Years since quitting: 10.0   Smokeless tobacco: Never  Vaping Use   Vaping Use: Never used  Substance and Sexual Activity   Alcohol use: Yes    Comment: occasional   Drug use: No   Sexual activity: Yes    Birth control/protection: None  Other Topics Concern   Not on file  Social  History Narrative   Patient is right-handed. She lives in a 2 level home. Drinks one cup of coffee some days. She walks daily.   Social Determinants of Health   Financial Resource Strain: Not on file  Food Insecurity: Not on file  Transportation Needs: Not on file  Physical Activity: Not on file  Stress: Not on file  Social Connections: Not on file    Her Allergies Are:  No Known Allergies:   Her Current Medications Are:  Outpatient Encounter Medications as of 08/02/2022  Medication Sig   amLODipine (NORVASC) 2.5 MG tablet TAKE 1 TABLET BY MOUTH EVERY DAY   Dulaglutide (TRULICITY) 7.40 CX/4.4YJ SOPN INJECT 0.75 MG SUBCUTANEOUSLY ONE TIME PER WEEK   famotidine (PEPCID) 40 MG tablet TAKE 1 TABLET BY MOUTH EVERYDAY AT BEDTIME   ibuprofen (ADVIL,MOTRIN) 800 MG tablet ibuprofen 800 mg tablet  TAKE 1 TABLET 3 TIMES A DAY AS NEEDED FOR PAIN   methocarbamol (ROBAXIN) 500 MG tablet Take 1 tablet (500 mg total) by mouth every 8 (eight) hours as needed for muscle spasms.   ondansetron (ZOFRAN) 4 MG tablet Take 1 tablet (4 mg total) by mouth every 8 (eight) hours as needed for nausea or vomiting.   pantoprazole (PROTONIX) 40 MG tablet Take 1 tablet 20 to 30 minutes before breakfast and 1 tablet 20 to 30 minutes before dinner meal.   UBRELVY 100 MG TABS TAKE 1 TABLET BY MOUTH AS NEEDED MAY REPEAT IN 2 HOURS IF NEEDED MAX 2 TABLETS IN 24 HOURS   ZEMBRACE SYMTOUCH 3 MG/0.5ML SOAJ Inject 3 mg into the skin as needed.    No facility-administered encounter medications on file as of 08/02/2022.  :  Review of Systems:  Out of a complete 14 point review of systems, all are reviewed and negative with the exception of these symptoms as listed below:  Review of Systems  Neurological:        Pt here for CPAP f/u Pt states no questions or concerns for today's visit    ESS: 8    Objective:  Neurological Exam  Physical Exam Physical Examination:   Vitals:   08/02/22 0952  BP: (!) 144/101  Pulse: 95    General Examination: The patient is a very pleasant 51 y.o. female in no acute distress. She appears well-developed and well-nourished and well groomed.   HEENT: Normocephalic, atraumatic, pupils are equal, round and reactive to light, extraocular tracking is good without limitation to gaze excursion or nystagmus noted. Hearing is grossly intact. Face is symmetric with normal facial animation. Speech is clear with no dysarthria noted. There is no hypophonia. There is no lip, neck/head, jaw or voice tremor. Neck is supple with full range of passive and active motion. There are no carotid bruits on auscultation. Oropharynx exam reveals: mild mouth dryness, good dental hygiene and mild airway crowding, due to small airway entry, Mallampati class II, tonsils on the smaller side.  Tongue protrudes centrally and palate elevates symmetrically.  Neck circumference of 15 three-quarter inches.  She has a mild overbite.   Chest: Clear to auscultation without wheezing, rhonchi or crackles noted.   Heart: S1+S2+0, regular and normal without murmurs, rubs or gallops noted.    Abdomen: Soft, non-tender and non-with normal bowel sounds appreciated on auscultation.   Extremities: There is trace pitting edema in the distal lower extremities bilaterally.    Skin: Warm and dry without trophic changes noted.    Musculoskeletal: exam reveals no obvious joint deformities, tenderness or joint  swelling or erythema.     Neurologically:  Mental status: The patient is awake, alert and oriented in all 4 spheres. Her immediate and remote memory, attention, language skills and fund of knowledge are appropriate. There is no evidence of aphasia, agnosia, apraxia or anomia. Speech is clear with normal prosody and enunciation. Thought process is linear. Mood is normal and affect is normal.  Cranial nerves II - XII are as described above under HEENT exam.  Motor exam: Normal bulk, strength and tone is noted. There is no tremor, fine motor skills and coordination: grossly intact.  Cerebellar testing: No dysmetria or intention tremor. There is no truncal or gait ataxia.  Sensory exam: intact to light touch in the upper and lower extremities.  Gait, station and balance: She stands easily. No veering to one side is noted. No leaning to one side is noted. Posture is age-appropriate and stance is narrow based. Gait shows normal stride length and normal pace. No problems turning are noted.    Assessment and Plan:  In summary, KIANNI LHEUREUX is a very pleasant 51 year old female with an underlying medical history of reflux disease, migraine headaches and dizziness (followed by Dr. Tomi Wagner at [facility] neurology), anxiety, depression, borderline diabetes, history of kidney stones, arthritis, and obesity, who presents for follow-up consultation of her obstructive sleep apnea, after interim testing and starting AutoPap therapy. Her baseline sleep study on 05/13/2022 showed severe obstructive sleep apnea with an AHI of 36.9/h, REM AHI 68.6/h, O2 nadir 79%.  She started AutoPap therapy on 06/28/2022.  She is fully compliant with treatment, apnea control is good.  She is using a nasal mask after switching from nasal cushion.  She is motivated to continue with treatment, she is working on weight loss and may pursue weight loss surgery in the near future.  We talked about the importance of treating moderate and severe obstructive sleep apnea.  She has  not had much in the way of significant or telltale improvement in her daytime symptoms but has noticed improvement in her gasping and snoring.  She is advised to follow-up routinely in our sleep clinic to see one of our nurse practitioners in 1 year.  Should she achieve a significant amount of weight loss in the future, we can reassess her sleep apnea diagnosis with the help of a home sleep test.  We can pick up our discussion at the next appointment as well.  She is highly commended for her treatment adherence.  We reviewed her sleep study results and her compliance data in detail today.  I answered all her questions today and she was in agreement.   I spent 30 minutes in total face-to-face time and in reviewing records during pre-charting, more than 50% of which was spent in counseling and coordination of care, reviewing test results, reviewing medications and treatment regimen and/or in discussing or reviewing the diagnosis of OSA, the prognosis and treatment options. Pertinent laboratory and imaging test results that were available during this visit with the patient were reviewed by me and considered in my medical decision making (see chart for details).

## 2022-08-04 ENCOUNTER — Other Ambulatory Visit: Payer: Self-pay | Admitting: Gastroenterology

## 2022-08-20 HISTORY — PX: DENTAL SURGERY: SHX609

## 2022-08-24 ENCOUNTER — Encounter: Payer: Self-pay | Admitting: Dietician

## 2022-08-24 ENCOUNTER — Encounter: Payer: BC Managed Care – PPO | Attending: Surgery | Admitting: Dietician

## 2022-08-24 VITALS — Ht 66.0 in | Wt 241.3 lb

## 2022-08-24 DIAGNOSIS — E669 Obesity, unspecified: Secondary | ICD-10-CM | POA: Diagnosis not present

## 2022-08-24 NOTE — Progress Notes (Signed)
Supervised Weight Loss Visit Bariatric Nutrition Education Appt Start Time: 9:15    End Time: 9:47  Planned surgery: RYGB  2 out of 6 SWL Appointments   NUTRITION ASSESSMENT   Anthropometrics  Start weight at [facility]: 235.7 lbs (date: 07/03/2022)  Height: 66 in Weight today: 241.3 lbs. BMI: 38.95 kg/m2     Clinical  Medical hx: GERD, HTN, obesity, sleep apnea, depression, anxiety, arthritis, history of kidney stones Medications: Bupropion, Robaxin, famotidine, amlodipine besylate, pantoprazole SOD, fusion, vilazodone, ubrelvy, zembrace, Trulicity   Labs: Trig 952 Notable signs/symptoms: none noted Any previous deficiencies? No  Lifestyle & Dietary Hx  Pt states she has been seeing a periodontist, for mouth/gum procedures. Pt states fluid intake has suffered, stating her mouth is sensitive to cold fluids. Pt states that she has been watching the 1,00 pound sisters reality show, stating it is causing some anxiety. Pt states the show discusses life after bariatric surgery. Pt states she is trying to be mindful while eating, stating avoiding distractions. Pt states she is tracking protein, being more  Pt states she wants to meal prep, stating she will practice now and get a food scale to measure portion sizes.  Estimated daily fluid intake: 38-40 oz per day Supplements:  Current average weekly physical activity: ADLs   24-Hr Dietary Recall First Meal: 2 eggs, toast with sugar free jelly, coffee Snack:  Second Meal: chicken or [place] sandwich, vegetables sometimes Snack:  Third Meal: Spaghetti (whole wheat) or mac and cheese or roast and vegetables or salmon or baked chicken Snack: something sweet, pumpkin treat like pie Beverages: zero sugar energy drink, coffee  Estimated Energy Needs Calories: 1600  NUTRITION DIAGNOSIS  Overweight/obesity ([place]-3.3) related to past poor dietary habits and physical inactivity as evidenced by patient w/ planned RYGB surgery following dietary  guidelines for continued weight loss.  NUTRITION INTERVENTION  Nutrition counseling (C-1) and education (E-2) to facilitate bariatric surgery goals.  Why you need complex carbohydrates: Whole grains and other complex carbohydrates are required to have a healthy diet. Whole grains provide fiber which can help with blood glucose levels and help keep you satiated. Fruits and starchy vegetables provide essential vitamins and minerals required for immune function, eyesight support, brain support, bone density, wound healing and many other functions within the body. According to the current evidenced based 2020-2025 Dietary Guidelines for Americans, complex carbohydrates are part of a healthy eating pattern which is associated with a decreased risk for type 2 diabetes, cancers, and cardiovascular disease.   Pre-Op Goals Progress & New Goals Continue: Consume 3 meals per day or try to eat every 3-5 hours Continue: Practice not drinking 15 minutes before, during, and 30 minutes after each meal and snack Continue: Aim for 64-100 ounces of FLUID daily (with at least half of fluid intake being plain water)  Continue: Aim for at least 60-80 grams of PROTEIN daily New: Practice CHEWING your food (aim for applesauce consistency) New: Make healthy food choices while monitoring portion sizes; use the meal ideas handout  Handouts Provided Include  Meal Ideas Handout  Learning Style & Readiness for Change Teaching method utilized: Visual & Auditory  Demonstrated degree of understanding via: Teach Back  Readiness Level: preparation Barriers to learning/adherence to lifestyle change: fear and anxiety of surgery  RD's Notes for next Visit  Patient progress toward chosen goals Discuss Fat (Saturated vs. Unsaturated) Increasing fluid intake  MONITORING & EVALUATION Dietary intake, weekly physical activity, body weight, and pre-op goals in 1 month.  Next Steps  Patient is to return to [facility] in 1 month for  next SWL visit.

## 2022-08-28 DIAGNOSIS — G4733 Obstructive sleep apnea (adult) (pediatric): Secondary | ICD-10-CM | POA: Diagnosis not present

## 2022-08-29 ENCOUNTER — Other Ambulatory Visit (HOSPITAL_COMMUNITY): Payer: Self-pay

## 2022-08-30 ENCOUNTER — Other Ambulatory Visit: Payer: Self-pay | Admitting: Gastroenterology

## 2022-08-30 NOTE — Telephone Encounter (Signed)
This is a patient of Dr Ardis Hughs.  Please advise if OK to refill as you are DOD pm  Thank you

## 2022-08-30 NOTE — Telephone Encounter (Signed)
Ok to refill 

## 2022-08-31 ENCOUNTER — Ambulatory Visit (INDEPENDENT_AMBULATORY_CARE_PROVIDER_SITE_OTHER): Payer: BC Managed Care – PPO | Admitting: Family Medicine

## 2022-08-31 ENCOUNTER — Encounter: Payer: Self-pay | Admitting: Family Medicine

## 2022-08-31 VITALS — BP 114/86 | HR 83 | Temp 97.6°F | Resp 17 | Ht 66.0 in | Wt 241.5 lb

## 2022-08-31 DIAGNOSIS — Z720 Tobacco use: Secondary | ICD-10-CM | POA: Diagnosis not present

## 2022-08-31 DIAGNOSIS — Z0000 Encounter for general adult medical examination without abnormal findings: Secondary | ICD-10-CM

## 2022-08-31 DIAGNOSIS — R7303 Prediabetes: Secondary | ICD-10-CM

## 2022-08-31 MED ORDER — VARENICLINE TARTRATE 1 MG PO TABS: 1.0000 mg | ORAL_TABLET | Freq: Two times a day (BID) | ORAL | 1 refills | Status: DC

## 2022-08-31 MED ORDER — VARENICLINE TARTRATE (STARTER) 0.5 MG X 11 & 1 MG X 42 PO TBPK: ORAL_TABLET | ORAL | 0 refills | Status: DC

## 2022-08-31 NOTE — Patient Instructions (Addendum)
Follow up in 6 months to recheck blood pressure We'll notify you of your lab results and make any changes if needed Continue to work on healthy diet and regular physical activity- you're doing great! Call and schedule your colonoscopy Use the Chantix to stop smoking!  You can do it! Call with any questions or concerns Stay Safe!  Stay Healthy! I'm Proud Of You!!!

## 2022-08-31 NOTE — Assessment & Plan Note (Signed)
Pt's PE WNL w/ exception of BMI.  UTD on pap, mammo.  Colonoscopy pending.  UTD on immunizations.  Check labs.  Anticipatory guidance provided.

## 2022-08-31 NOTE — Assessment & Plan Note (Signed)
Unfortunately pt has resumed smoking.  She is again trying to quit in case she wants to proceed w/ gastric bypass.  She did well on Chantix before and is asking for this to be sent in again.  Prescriptions sent to pharmacy.

## 2022-08-31 NOTE — Progress Notes (Signed)
   Subjective:    Patient ID: Emma Wagner, female    DOB: 1971/02/07, 52 y.o.   MRN: [id number]  HPI CPE- UTD on pap, mammo [facility]).  Due for colon cancer screen- has colonoscopy pending.  UTD on Tdap, flu.  Pt wants to restart Chantix to quit smoking.    Patient Care Team    Relationship Specialty Notifications Start End  Midge Minium, MD PCP - General Family Medicine  08/03/11   Key, Nelia Shi, NP Nurse Practitioner Gynecology  05/11/15   Latanya Maudlin, MD Consulting Physician Orthopedic Surgery  05/12/15   Chucky May, MD Consulting Physician Psychiatry  05/12/15   Pieter Partridge, DO Consulting Physician Neurology  03/13/19      Health Maintenance  Topic Date Due   COLONOSCOPY (Pts 45-60yr Insurance coverage will need to be confirmed)  Never done   MAMMOGRAM  04/04/2019   Lung Cancer Screening  12/08/2020   PAP SMEAR-Modifier  06/25/2025   DTaP/Tdap/Td (2 - Td or Tdap) 05/30/2026   INFLUENZA VACCINE  Completed   HPV VACCINES  Aged Out   COVID-19 Vaccine  Discontinued   Hepatitis C Screening  Discontinued   HIV Screening  Discontinued   Zoster Vaccines- Shingrix  Discontinued      Review of Systems Patient reports no vision/ hearing changes, adenopathy,fever, weight change,  persistant/recurrent hoarseness , swallowing issues, chest pain, palpitations, edema, persistant/recurrent cough, hemoptysis, dyspnea (rest/exertional/paroxysmal nocturnal), gastrointestinal bleeding (melena, rectal bleeding), abdominal pain, significant heartburn, bowel changes, GU symptoms (dysuria, hematuria, incontinence), Gyn symptoms (abnormal  bleeding, pain),  syncope, focal weakness, memory loss, skin/hair/nail changes, abnormal bruising or bleeding, anxiety, or depression.   + bilateral hand numbness- following w/ Ortho    Objective:   Physical Exam General Appearance:    Alert, cooperative, no distress, appears stated age, obese  Head:    Normocephalic, without obvious  abnormality, atraumatic  Eyes:    PERRL, conjunctiva/corneas clear, EOM's intact both eyes  Ears:    Normal TM's and external ear canals, both ears  Nose:   Nares normal, septum midline, mucosa normal, no drainage    or sinus tenderness  Throat:   Lips, mucosa, and tongue normal; teeth and gums normal  Neck:   Supple, symmetrical, trachea midline, no adenopathy;    Thyroid: no enlargement/tenderness/nodules  Back:     Symmetric, no curvature, ROM normal, no CVA tenderness  Lungs:     Clear to auscultation bilaterally, respirations unlabored  Chest Wall:    No tenderness or deformity   Heart:    Regular rate and rhythm, S1 and S2 normal, no murmur, rub   or gallop  Breast Exam:    Deferred to GYN  Abdomen:     Soft, non-tender, bowel sounds active all four quadrants,    no masses, no organomegaly  Genitalia:    Deferred to GYN  Rectal:    Extremities:   Extremities normal, atraumatic, no cyanosis or edema  Pulses:   2+ and symmetric all extremities  Skin:   Skin color, texture, turgor normal, no rashes or lesions  Lymph nodes:   Cervical, supraclavicular, and axillary nodes normal  Neurologic:   CNII-XII intact, normal strength, sensation and reflexes    throughout          Assessment & Plan:

## 2022-08-31 NOTE — Assessment & Plan Note (Signed)
Currently on Trulicity.  Check labs and determine if adjustments need to be made.

## 2022-09-01 LAB — CBC WITH DIFFERENTIAL/PLATELET
Absolute Monocytes: 556 cells/uL (ref 200–950)
Basophils Absolute: 86 cells/uL (ref 0–200)
Basophils Relative: 0.8 %
Eosinophils Absolute: 118 cells/uL (ref 15–500)
Eosinophils Relative: 1.1 %
HCT: 38.1 % (ref 35.0–45.0)
Hemoglobin: 13.2 g/dL (ref 11.7–15.5)
Lymphs Abs: 3542 cells/uL (ref 850–3900)
MCH: 30.5 pg (ref 27.0–33.0)
MCHC: 34.6 g/dL (ref 32.0–36.0)
MCV: 88 fL (ref 80.0–100.0)
MPV: 11 fL (ref 7.5–12.5)
Monocytes Relative: 5.2 %
Neutro Abs: 6399 cells/uL (ref 1500–7800)
Neutrophils Relative %: 59.8 %
Platelets: 292 10*3/uL (ref 140–400)
RBC: 4.33 10*6/uL (ref 3.80–5.10)
RDW: 13.5 % (ref 11.0–15.0)
Total Lymphocyte: 33.1 %
WBC: 10.7 10*3/uL (ref 3.8–10.8)

## 2022-09-01 LAB — HEPATIC FUNCTION PANEL
AG Ratio: 1.6 (calc) (ref 1.0–2.5)
ALT: 21 U/L (ref 6–29)
AST: 16 U/L (ref 10–35)
Albumin: 4.1 g/dL (ref 3.6–5.1)
Alkaline phosphatase (APISO): 54 U/L (ref 37–153)
Bilirubin, Direct: 0.1 mg/dL (ref 0.0–0.2)
Globulin: 2.6 g/dL (calc) (ref 1.9–3.7)
Indirect Bilirubin: 0.3 mg/dL (calc) (ref 0.2–1.2)
Total Bilirubin: 0.4 mg/dL (ref 0.2–1.2)
Total Protein: 6.7 g/dL (ref 6.1–8.1)

## 2022-09-01 LAB — HEMOGLOBIN A1C
Hgb A1c MFr Bld: 6.3 % of total Hgb — ABNORMAL HIGH (ref <5.7)
Mean Plasma Glucose: 134 mg/dL
eAG (mmol/L): 7.4 mmol/L

## 2022-09-01 LAB — BASIC METABOLIC PANEL
BUN: 10 mg/dL (ref 7–25)
CO2: 26 mmol/L (ref 20–32)
Calcium: 9.2 mg/dL (ref 8.6–10.4)
Chloride: 108 mmol/L (ref 98–110)
Creat: 0.85 mg/dL (ref 0.50–1.03)
Glucose, Bld: 115 mg/dL — ABNORMAL HIGH (ref 65–99)
Potassium: 4.1 mmol/L (ref 3.5–5.3)
Sodium: 143 mmol/L (ref 135–146)

## 2022-09-01 LAB — LIPID PANEL
Cholesterol: 196 mg/dL (ref <200)
HDL: 47 mg/dL — ABNORMAL LOW (ref >=50)
LDL Cholesterol (Calc): 117 mg/dL (calc) — ABNORMAL HIGH
Non-HDL Cholesterol (Calc): 149 mg/dL (calc) — ABNORMAL HIGH (ref <130)
Total CHOL/HDL Ratio: 4.2 (calc) (ref <5.0)
Triglycerides: 200 mg/dL — ABNORMAL HIGH (ref <150)

## 2022-09-01 LAB — VITAMIN D 25 HYDROXY (VIT D DEFICIENCY, FRACTURES): Vit D, 25-Hydroxy: 19 ng/mL — ABNORMAL LOW (ref 30–100)

## 2022-09-01 LAB — TSH: TSH: 1.17 mIU/L

## 2022-09-03 ENCOUNTER — Other Ambulatory Visit: Payer: Self-pay | Admitting: Gastroenterology

## 2022-09-03 ENCOUNTER — Other Ambulatory Visit: Payer: Self-pay

## 2022-09-03 ENCOUNTER — Telehealth: Payer: Self-pay

## 2022-09-03 ENCOUNTER — Encounter: Payer: Self-pay | Admitting: Family Medicine

## 2022-09-03 DIAGNOSIS — R7989 Other specified abnormal findings of blood chemistry: Secondary | ICD-10-CM

## 2022-09-03 MED ORDER — VITAMIN D (ERGOCALCIFEROL) 1.25 MG (50000 UNIT) PO CAPS: 50000.0000 [IU] | ORAL_CAPSULE | ORAL | 12 refills | Status: DC

## 2022-09-03 NOTE — Telephone Encounter (Signed)
-----  Message from Midge Minium, MD sent at 09/03/2022  7:31 AM EST ----- Vit D is low.  Based on this, we need to start 50,000 units weekly x12 weeks in addition to daily OTC supplement of at least 2000 units.   Your A1C is back up to 6.3% in the pre-diabetes range.  This will improve w/ continued attention to low carb/low sugar diet and regular physical activity (or weight loss surgery if you decide to proceed)  Total cholesterol, LDL (bad cholesterol), and triglycerides (fatty part of blood) are all elevated.  Again, this will improve w/ attention to diet and regular physical activity  Remainder of labs are stable and look good!

## 2022-09-03 NOTE — Telephone Encounter (Signed)
This is a patient of Dr Ardis Hughs.  Please advise refills as you are DOD am.  Thank you

## 2022-09-03 NOTE — Telephone Encounter (Signed)
I refilled the medication x 1 w/ 1 refill  Please make her an appointment to see me - I reviewed chart and surgeon she saw re: possible bariatric surgery wants her to have an EGD and a screening colonoscopy and I would like to review that and arrange procedures

## 2022-09-03 NOTE — Telephone Encounter (Signed)
Informed pt of lab results Vit D 50,000 has been sent to pharmacy

## 2022-09-04 NOTE — Telephone Encounter (Signed)
Left message for patient to return call to further discuss scheduling a follow up appointment with Dr Carlean Purl prior to possible bariatric surgery. She may require EGD and screening colon.  Will continue efforts.

## 2022-09-05 NOTE — Telephone Encounter (Signed)
Patient aware that she has been scheduled for follow up appointment with Dr Carlean Purl on 09-27-22 at 9:10am to further discuss EGD/surveillance colonoscopy.  Patient agreed to plan and verbalized understanding.  No further questions.

## 2022-09-21 ENCOUNTER — Other Ambulatory Visit (HOSPITAL_COMMUNITY): Payer: Self-pay

## 2022-09-21 ENCOUNTER — Telehealth: Payer: Self-pay

## 2022-09-21 NOTE — Telephone Encounter (Signed)
PA request received via CMM for Ubrelvy '100MG'$  tablets  PA not submitted due to question of therapy and previous trials   Key: [id number]

## 2022-09-24 ENCOUNTER — Encounter: Payer: BC Managed Care – PPO | Attending: Surgery | Admitting: Dietician

## 2022-09-24 ENCOUNTER — Encounter: Payer: Self-pay | Admitting: Dietician

## 2022-09-24 VITALS — Ht 66.0 in | Wt 242.3 lb

## 2022-09-24 DIAGNOSIS — E669 Obesity, unspecified: Secondary | ICD-10-CM | POA: Insufficient documentation

## 2022-09-24 NOTE — Progress Notes (Signed)
Supervised Weight Loss Visit Bariatric Nutrition Education Appt Start Time: 9:20   End Time: 9:47  Planned surgery: RYGB  3 out of 6 SWL Appointments   NUTRITION ASSESSMENT   Anthropometrics  Start weight at [facility]: 235.7 lbs (date: 07/03/2022)  Height: 66 in Weight today: 242.3 lbs. BMI: 39.11 kg/m2     Clinical  Medical hx: GERD, HTN, obesity, sleep apnea, depression, anxiety, arthritis, history of kidney stones Medications: Bupropion, Robaxin, famotidine, amlodipine besylate, pantoprazole SOD, fusion, vilazodone, ubrelvy, zembrace, Trulicity   Labs: Trig 370 Notable signs/symptoms: none noted Any previous deficiencies? No  Lifestyle & Dietary Hx  Pt states she is starting to take vitamin D supplement. Pt states she had some work done on her teeth/gum, stating her teeth are sensitive to temperatures and sugar.  She states sugar makes her teeth hurt.  Pt states she has been eating softer foods. Pt states she is not working right now and stating she is battling mental health right now.  Pt states she sees Dr. Robina Ade, stating she is not seeing a counselor, and states she is just dealing with it. Pt states she bought a cockatiel (pet bird) this weekend. Pt states she has cut back on the energy drinks. Pt states it is difficult to not drink with meals, stating she is very mindful of it. Pt states she is getting at least 60 grams of protein. Pt states she is interested in wall pilates and walking, stating she still has physical ailments that bother her.  Estimated daily fluid intake: 50 oz per day Supplements: Vit D Current average weekly physical activity: ADLs  24-Hr Dietary Recall First Meal: 2 eggs, toast with sugar free jelly, coffee or oatmeal Snack:  Second Meal: chicken or [place] sandwich, vegetables sometimes Snack:  Third Meal: Spaghetti (whole wheat) or mac and cheese or rice or roast and vegetables or salmon or baked chicken or cubed steak, green beans, broccoli,  asparagus, brussels sprouts. Snack: something sweet, pumpkin treat like pie Beverages: zero sugar energy drink (less), coffee, sparkling ice  Estimated Energy Needs Calories: 1600  NUTRITION DIAGNOSIS  Overweight/obesity ([place]-3.3) related to past poor dietary habits and physical inactivity as evidenced by patient w/ planned RYGB surgery following dietary guidelines for continued weight loss.  NUTRITION INTERVENTION  Nutrition counseling (C-1) and education (E-2) to facilitate bariatric surgery goals.  Why you need complex carbohydrates: Whole grains and other complex carbohydrates are required to have a healthy diet. Whole grains provide fiber which can help with blood glucose levels and help keep you satiated. Fruits and starchy vegetables provide essential vitamins and minerals required for immune function, eyesight support, brain support, bone density, wound healing and many other functions within the body. According to the current evidenced based 2020-2025 Dietary Guidelines for Americans, complex carbohydrates are part of a healthy eating pattern which is associated with a decreased risk for type 2 diabetes, cancers, and cardiovascular disease.   Pre-Op Goals Progress & New Goals Continue: Consume 3 meals per day or try to eat every 3-5 hours Continue: Aim for 64-100 ounces of FLUID daily (with at least half of fluid intake being plain water)  Continue: Aim for at least 60-80 grams of PROTEIN daily Continue: Practice CHEWING your food (aim for applesauce consistency) Continue: Make healthy food choices while monitoring portion sizes; use the meal ideas handout Re-engage/Continue: Practice not drinking 15 minutes before, during, and 30 minutes after each meal and snack  Handouts Provided Include  Meal Ideas Handout  Learning Style &  Readiness for Change Teaching method utilized: Visual & Auditory  Demonstrated degree of understanding via: Teach Back  Readiness Level:  preparation Barriers to learning/adherence to lifestyle change: fear and anxiety of surgery  RD's Notes for next Visit  Patient progress toward chosen goals Discuss Fat (Saturated vs. Unsaturated) Increasing fluid intake  MONITORING & EVALUATION Dietary intake, weekly physical activity, body weight, and pre-op goals.   Next Steps  Patient is to return to [facility] in 1 month for next SWL visit.

## 2022-09-25 NOTE — Telephone Encounter (Signed)
PA has been submitted and is pending determination. 

## 2022-09-25 NOTE — Telephone Encounter (Signed)
Patient Advocate Encounter  Prior Authorization for Roselyn Meier '100MG'$  tablets has been approved.    PA# [id number] Effective dates: 09/25/2022 through 09/25/2023      Lyndel Safe, [place] Patient Advocate Specialist [place] Patient Advocate Team Direct Number: [phone]  Fax: [phone]

## 2022-09-26 ENCOUNTER — Other Ambulatory Visit: Payer: Self-pay | Admitting: Family Medicine

## 2022-09-27 ENCOUNTER — Encounter: Payer: Self-pay | Admitting: Internal Medicine

## 2022-09-27 ENCOUNTER — Ambulatory Visit: Payer: BC Managed Care – PPO | Admitting: Internal Medicine

## 2022-09-27 VITALS — BP 112/80 | HR 79 | Ht 66.0 in | Wt 242.1 lb

## 2022-09-27 DIAGNOSIS — Z01818 Encounter for other preprocedural examination: Secondary | ICD-10-CM

## 2022-09-27 DIAGNOSIS — K219 Gastro-esophageal reflux disease without esophagitis: Secondary | ICD-10-CM | POA: Diagnosis not present

## 2022-09-27 DIAGNOSIS — Z1211 Encounter for screening for malignant neoplasm of colon: Secondary | ICD-10-CM

## 2022-09-27 NOTE — Patient Instructions (Signed)
You have been scheduled for an endoscopy and colonoscopy. Please follow the written instructions given to you at your visit today. Please pick up your prep supplies at the pharmacy within the next 1-3 days. If you use inhalers (even only as needed), please bring them with you on the day of your procedure.   Make sure and hold your Trulicity 3 days prior to your procedure.   Due to recent changes in healthcare laws, you may see the results of your imaging and laboratory studies on MyChart before your provider has had a chance to review them.  We understand that in some cases there may be results that are confusing or concerning to you. Not all laboratory results come back in the same time frame and the provider may be waiting for multiple results in order to interpret others.  Please give [place] 48 hours in order for your provider to thoroughly review all the results before contacting the office for clarification of your results.   I appreciate the opportunity to care for you. Silvano Rusk, MD, [facility]

## 2022-09-27 NOTE — Progress Notes (Signed)
Emma Wagner 52 y.o. December 15, 1970 [id number]  Assessment & Plan:   Encounter Diagnoses  Name Primary?   Gastroesophageal reflux disease, unspecified whether esophagitis present Yes   Colon cancer screening    Preoperative examination prior to possible bariatric surgery    The patient was scheduled for EGD and colonoscopy. As per surgery protocol will biopsy for H. pylori at the time of EGD.  The risks and benefits as well as alternatives of endoscopic procedure(s) have been discussed and reviewed. All questions answered. The patient agrees to proceed.   CC: Midge Minium, MD Vonita Moss Stechschulte MD  Subjective:   Chief Complaint: GERD, colon cancer screening, considering bariatric surgery  HPI 52 year old white woman known to my partner Dr. Ardis Hughs, with history of GERD which is well-controlled on medication (pantoprazole and famotidine) who has been evaluated for possible bariatric surgery on June 28, 2022 by Clarita Leber MD. I have reviewed that note.  She is in need of colon cancer screening and preoperative EGD to evaluate for H. pylori and structural abnormalities.   She denies any dysphagia problems, she says her heartburn and reflux are well-controlled.  She has been on those for several years.  She saw Dr. Ardis Hughs in December 2021.  She has never had an EGD or colonoscopy.  She is not having any constipation or change in bowel habits or bleeding.  She had a CT scan on 06/05/2022 because of a renal cyst found on other imaging liver span was increased at 21.5 cm, there was a Bosniak 1 benign right renal cyst.  Left adrenal adenoma, benign.  No Known Allergies Current Meds  Medication Sig   amLODipine (NORVASC) 2.5 MG tablet TAKE 1 TABLET BY MOUTH EVERY DAY   buPROPion (WELLBUTRIN XL) 150 MG 24 hr tablet Take 150 mg by mouth every morning.   clonazePAM (KLONOPIN) 1 MG tablet Take 1 mg by mouth daily.   Dulaglutide (TRULICITY) 3.32 RJ/1.8AC SOPN  INJECT 0.75 MG SUBCUTANEOUSLY ONE TIME PER WEEK   famotidine (PEPCID) 40 MG tablet TAKE 1 TABLET BY MOUTH EVERYDAY AT BEDTIME   ibuprofen (ADVIL,MOTRIN) 800 MG tablet ibuprofen 800 mg tablet  TAKE 1 TABLET 3 TIMES A DAY AS NEEDED FOR PAIN   LACTOBACILLUS PO Take 1 capsule by mouth daily.   methocarbamol (ROBAXIN) 500 MG tablet Take 1 tablet (500 mg total) by mouth every 8 (eight) hours as needed for muscle spasms.   ondansetron (ZOFRAN) 4 MG tablet Take 1 tablet (4 mg total) by mouth every 8 (eight) hours as needed for nausea or vomiting.   pantoprazole (PROTONIX) 40 MG tablet TAKE 1 TABLET 20 TO 30 MINUTES BEFORE BREAKFAST AND 1 TABLET 20 TO 30 MINUTES BEFORE DINNER MEAL.   UBRELVY 100 MG TABS TAKE 1 TABLET BY MOUTH AS NEEDED MAY REPEAT IN 2 HOURS IF NEEDED MAX 2 TABLETS IN 24 HOURS   varenicline (CHANTIX) 1 MG tablet TAKE 1 TABLET BY MOUTH TWICE A DAY   Varenicline Tartrate, Starter, (CHANTIX STARTING MONTH PAK) 0.5 MG X 11 & 1 MG X 42 TBPK Take 1 0.5 mg tab PO daily  x3 days, then increase to 1 0.5 mg tab BID x4 days, then increase to one 1 mg tablet BID.   Vitamin D, Ergocalciferol, (DRISDOL) 1.25 MG (50000 UNIT) CAPS capsule Take 1 capsule (50,000 Units total) by mouth every 7 (seven) days.   ZEMBRACE SYMTOUCH 3 MG/0.5ML SOAJ Inject 3 mg into the skin as needed.   Past Medical History:  Diagnosis Date   Anxiety    add also   Arthritis    Borderline type 2 diabetes mellitus since 2013   -lost weight and quit smoking and is no longer PRE diabetic   Bursitis    bilateral hips, more on right hip   Chronic kidney disease    h/o kidney stone 2016   Constipation    Depression    Frequent urination    while kidney stone evident   GERD (gastroesophageal reflux disease)    Headache    "every blue moon. not often"   Hemorrhoids    History of kidney stones    Obesity    Pneumonia    PONV (postoperative nausea and vomiting)    nausea with appendectomy and with hip replacement in March  2017   Past Surgical History:  Procedure Laterality Date   APPENDECTOMY  08/21/1983   CESAREAN SECTION  08/21/2007   DENTAL SURGERY  08/2022   2 teeth in the back on your right side. 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 She lives in a 2 level home. Drinks one cup of coffee some days. She walks daily.   Married to [place], 2 children   She reports occasional alcohol use but no tobacco products (former smoker) or drug use.   family history includes Anxiety disorder in an other family member; Arthritis in her mother; Cancer in an other family member; Congestive Heart Failure in her father; Depression in an other family member; Heart failure in an other family member; Hypertension in her father, mother, and another family member; Insomnia in an other family member; Prostate cancer in her father; Skin cancer in her father; Sleep apnea in her mother; Snoring in her father.   Review of Systems As perHPI  Objective:   Physical Exam '@BP'$  112/80   Pulse 79   Ht '5\' 6"'$  (1.676 m)   Wt 242 lb 2 oz (109.8 kg)   BMI 39.08 kg/m @  General:  Well-developed, well-nourished and in no acute distress - obese Eyes:  anicteric. ENT:   Mouth and posterior pharynx free of lesions.  Neck:   supple w/o  mass.  Lungs: Clear to auscultation bilaterally. Heart:   S1S2, no rubs, murmurs, gallops. Abdomen:  soft, non-tender, no hepatosplenomegaly, hernia, or mass and BS+.  Rectal: Deferred to colonoscopy Extremities:   no  cyanosis or  clubbing Skin   no rash. Some tattoos Neuro:  A&O x 3.  Psych:  appropriate mood and  Affect.   Data Reviewed: See HPI

## 2022-09-28 ENCOUNTER — Other Ambulatory Visit: Payer: Self-pay | Admitting: Internal Medicine

## 2022-09-28 DIAGNOSIS — G4733 Obstructive sleep apnea (adult) (pediatric): Secondary | ICD-10-CM | POA: Diagnosis not present

## 2022-10-10 DIAGNOSIS — F411 Generalized anxiety disorder: Secondary | ICD-10-CM | POA: Diagnosis not present

## 2022-10-10 DIAGNOSIS — F4310 Post-traumatic stress disorder, unspecified: Secondary | ICD-10-CM | POA: Diagnosis not present

## 2022-10-10 DIAGNOSIS — F3342 Major depressive disorder, recurrent, in full remission: Secondary | ICD-10-CM | POA: Diagnosis not present

## 2022-10-11 ENCOUNTER — Encounter: Payer: Self-pay | Admitting: Internal Medicine

## 2022-10-18 ENCOUNTER — Encounter: Payer: Self-pay | Admitting: Internal Medicine

## 2022-10-18 ENCOUNTER — Ambulatory Visit (AMBULATORY_SURGERY_CENTER): Payer: BC Managed Care – PPO | Admitting: Internal Medicine

## 2022-10-18 VITALS — BP 131/85 | HR 69 | Temp 96.8°F | Resp 18 | Ht 66.0 in | Wt 242.0 lb

## 2022-10-18 DIAGNOSIS — D125 Benign neoplasm of sigmoid colon: Secondary | ICD-10-CM

## 2022-10-18 DIAGNOSIS — Z1211 Encounter for screening for malignant neoplasm of colon: Secondary | ICD-10-CM

## 2022-10-18 DIAGNOSIS — K635 Polyp of colon: Secondary | ICD-10-CM

## 2022-10-18 DIAGNOSIS — K31A11 Gastric intestinal metaplasia without dysplasia, involving the antrum: Secondary | ICD-10-CM

## 2022-10-18 DIAGNOSIS — K219 Gastro-esophageal reflux disease without esophagitis: Secondary | ICD-10-CM

## 2022-10-18 MED ORDER — SODIUM CHLORIDE 0.9 % IV SOLN: 500.0000 mL | Freq: Once | INTRAVENOUS | Status: DC

## 2022-10-18 NOTE — Progress Notes (Signed)
Uneventful anesthetic. Report to pacu rn. Vss. Care resumed by rn.

## 2022-10-18 NOTE — Progress Notes (Signed)
History and Physical Interval Note:  10/18/2022 2:28 PM  Emma Wagner  has presented today for endoscopic procedure(s), with the diagnosis of  Encounter Diagnoses  Name Primary?   Gastroesophageal reflux disease, unspecified whether esophagitis present Yes   Colon cancer screening   .  The various methods of evaluation and treatment have been discussed with the patient and/or family. After consideration of risks, benefits and other options for treatment, the patient has consented to  the endoscopic procedure(s).   The patient's history has been reviewed, patient examined, no change in status, stable for endoscopic procedure(s).  I have reviewed the patient's chart and labs.  Questions were answered to the patient's satisfaction.     Gatha Mayer, MD, [facility]

## 2022-10-18 NOTE — Op Note (Signed)
[place] Patient Name: Emma Wagner Procedure Date: 10/18/2022 2:33 PM MRN: [id number] Endoscopist: Gatha Mayer , MD, [id number] Age: 52 Referring MD:  Date of Birth: [birth year] Gender: Female Account #: [id number] Procedure:                Upper GI endoscopy Indications:              Esophageal reflux Medicines:                Monitored Anesthesia Care Procedure:                Pre-Anesthesia Assessment:                           - Prior to the procedure, a History and Physical                            was performed, and patient medications and                            allergies were reviewed. The patient's tolerance of                            previous anesthesia was also reviewed. The risks                            and benefits of the procedure and the sedation                            options and risks were discussed with the patient.                            All questions were answered, and informed consent                            was obtained. Prior Anticoagulants: The patient has                            taken no anticoagulant or antiplatelet agents. ASA                            Grade Assessment: II - A patient with mild systemic                            disease. After reviewing the risks and benefits,                            the patient was deemed in satisfactory condition to                            undergo the procedure.                           After obtaining informed consent, the endoscope was  passed under direct vision. Throughout the                            procedure, the patient's blood pressure, pulse, and                            oxygen saturations were monitored continuously. The                            Olympus Scope [id number] was introduced through the                            mouth, and advanced to the second part of duodenum.                            The upper GI endoscopy was  accomplished without                            difficulty. The patient tolerated the procedure                            well. Scope In: Scope Out: Findings:                 The examined esophagus was normal.                           Patchy mildly erythematous mucosa without bleeding                            was found in the gastric antrum. Biopsies were                            taken with a cold forceps for histology.                            Verification of patient identification for the                            specimen was done. Estimated blood loss was minimal.                           The cardia and gastric fundus were normal on                            retroflexion.                           The examined duodenum was normal.                           The exam was otherwise without abnormality. Complications:            No immediate complications. Estimated Blood Loss:     Estimated blood loss was minimal. Impression:               -  Normal esophagus.                           - Erythematous mucosa in the antrum. Biopsied.                           - Normal examined duodenum.                           - The examination was otherwise normal. Recommendation:           - Patient has a contact number available for                            emergencies. The signs and symptoms of potential                            delayed complications were discussed with the                            patient. Return to normal activities tomorrow.                            Written discharge instructions were provided to the                            patient.                           - Resume previous diet.                           - Continue present medications.                           - See the other procedure note for documentation of                            additional recommendations. Gatha Mayer, MD 10/18/2022 3:11:03 PM This report has been signed electronically.

## 2022-10-18 NOTE — Progress Notes (Signed)
Bottom front two teeth loose, per patient. Bite block placed and fastened with patient awake; used for endoscopy and removed gently with no dental damage noted. Dr. Carlean Purl at a witness. A Casey Maxfield CRNA

## 2022-10-18 NOTE — Progress Notes (Signed)
Called to room to assist during endoscopic procedure.  Patient ID and intended procedure confirmed with present staff. Received instructions for my participation in the procedure from the performing physician.  

## 2022-10-18 NOTE — Patient Instructions (Addendum)
I did not see any reflux damage in the esophagus. I took stomach biopsies to check for H pylori infection - routine before bariatric surgery.  One tint colon polyp removed, I am sure it was benign. I will let you know pathology results and when to have another routine colonoscopy by mail and/or My Chart.  I appreciate the opportunity to care for you. Gatha Mayer, MD, FACG    YOU HAD AN ENDOSCOPIC PROCEDURE TODAY AT [facility]:   Refer to the procedure report that was given to you for any specific questions about what was found during the examination.  If the procedure report does not answer your questions, please call your gastroenterologist to clarify.  If you requested that your care partner not be given the details of your procedure findings, then the procedure report has been included in a sealed envelope for you to review at your convenience later.  YOU SHOULD EXPECT: Some feelings of bloating in the abdomen. Passage of more gas than usual.  Walking can help get rid of the air that was put into your GI tract during the procedure and reduce the bloating. If you had a lower endoscopy (such as a colonoscopy or flexible sigmoidoscopy) you may notice spotting of blood in your stool or on the toilet paper. If you underwent a bowel prep for your procedure, you may not have a normal bowel movement for a few days.  Please Note:  You might notice some irritation and congestion in your nose or some drainage.  This is from the oxygen used during your procedure.  There is no need for concern and it should clear up in a day or so.  SYMPTOMS TO REPORT IMMEDIATELY:  Following lower endoscopy (colonoscopy or flexible sigmoidoscopy):  Excessive amounts of blood in the stool  Significant tenderness or worsening of abdominal pains  Swelling of the abdomen that is new, acute  Fever of 100F or higher  Following upper endoscopy (EGD)  Vomiting of blood or coffee ground material  New  chest pain or pain under the shoulder blades  Painful or persistently difficult swallowing  New shortness of breath  Fever of 100F or higher  Black, tarry-looking stools  For urgent or emergent issues, a gastroenterologist can be reached at any hour by calling [phone]. Do not use MyChart messaging for urgent concerns.    DIET:  We do recommend a small meal at first, but then you may proceed to your regular diet.  Drink plenty of fluids but you should avoid alcoholic beverages for 24 hours.  ACTIVITY:  You should plan to take it easy for the rest of today and you should NOT DRIVE or use heavy machinery until tomorrow (because of the sedation medicines used during the test).    FOLLOW UP: Our staff will call the number listed on your records the next business day following your procedure.  We will call around 7:15- 8:00 am to check on you and address any questions or concerns that you may have regarding the information given to you following your procedure. If we do not reach you, we will leave a message.     If any biopsies were taken you will be contacted by phone or by letter within the next 1-3 weeks.  Please call us at [phone] if you have not heard about the biopsies in 3 weeks.    SIGNATURES/CONFIDENTIALITY: You and/or your care partner have signed paperwork which will be entered into your  electronic medical record.  These signatures attest to the fact that that the information above on your After Visit Summary has been reviewed and is understood.  Full responsibility of the confidentiality of this discharge information lies with you and/or your care-partner.

## 2022-10-18 NOTE — Op Note (Signed)
[employer] Patient Name: Emma Wagner Procedure Date: 10/18/2022 2:33 PM MRN: [id number] Endoscopist: Gatha Mayer , MD, [id number] Age: 52 Referring MD:  Date of Birth: Mar 02, 1971 Gender: Female Account #: [id number] Procedure:                Colonoscopy Indications:              Screening for colorectal malignant neoplasm, This                            is the patient's first colonoscopy Medicines:                Monitored Anesthesia Care Procedure:                Pre-Anesthesia Assessment:                           - Prior to the procedure, a History and Physical                            was performed, and patient medications and                            allergies were reviewed. The patient's tolerance of                            previous anesthesia was also reviewed. The risks                            and benefits of the procedure and the sedation                            options and risks were discussed with the patient.                            All questions were answered, and informed consent                            was obtained. Prior Anticoagulants: The patient has                            taken no anticoagulant or antiplatelet agents. ASA                            Grade Assessment: II - A patient with mild systemic                            disease. After reviewing the risks and benefits,                            the patient was deemed in satisfactory condition to                            undergo the procedure.  After obtaining informed consent, the colonoscope                            was passed under direct vision. Throughout the                            procedure, the patient's blood pressure, pulse, and                            oxygen saturations were monitored continuously. The                            Olympus PCF-H190DL [id number]) Colonoscope was                            introduced through the anus  and advanced to the the                            cecum, identified by appendiceal orifice and                            ileocecal valve. The patient tolerated the                            procedure well. The quality of the bowel                            preparation was good. The ileocecal valve,                            appendiceal orifice, and rectum were photographed.                            The bowel preparation used was Miralax via split                            dose instruction. The colonoscopy was somewhat                            difficult due to significant looping. Successful                            completion of the procedure was aided by applying                            abdominal pressure. Scope In: 2:44:35 PM Scope Out: 3:03:20 PM Scope Withdrawal Time: 0 hours 14 minutes 32 seconds  Total Procedure Duration: 0 hours 18 minutes 45 seconds  Findings:                 The perianal and digital rectal examinations were                            normal.  A diminutive polyp was found in the sigmoid colon.                            The polyp was sessile. The polyp was removed with a                            cold snare. Resection and retrieval were complete.                            Verification of patient identification for the                            specimen was done. Estimated blood loss was minimal. Complications:            No immediate complications. Estimated Blood Loss:     Estimated blood loss was minimal. Impression:               - One diminutive polyp in the sigmoid colon,                            removed with a cold snare. Resected and retrieved. Recommendation:           - Patient has a contact number available for                            emergencies. The signs and symptoms of potential                            delayed complications were discussed with the                            patient. Return to normal  activities tomorrow.                            Written discharge instructions were provided to the                            patient.                           - Resume previous diet.                           - Continue present medications.                           - Repeat colonoscopy is recommended. The                            colonoscopy date will be determined after pathology                            results from today's exam become available for  review. Would use adult scope due to looping                            encounter today. Gatha Mayer, MD 10/18/2022 3:15:23 PM This report has been signed electronically.

## 2022-10-19 ENCOUNTER — Telehealth: Payer: Self-pay | Admitting: *Deleted

## 2022-10-19 NOTE — Telephone Encounter (Signed)
  Follow up Call-     10/18/2022    1:21 PM  Call back number  Post procedure Call Back phone  # [phone]  Permission to leave phone message Yes     Patient questions:  Do you have a fever, pain , or abdominal swelling? No. Pain Score  0 *  Have you tolerated food without any problems? Yes.    Have you been able to return to your normal activities? Yes.    Do you have any questions about your discharge instructions: Diet   No. Medications  No. Follow up visit  No.  Do you have questions or concerns about your Care? No.  Actions: * If pain score is 4 or above: No action needed, pain <4.

## 2022-10-25 ENCOUNTER — Encounter: Payer: Self-pay | Attending: Surgery | Admitting: Dietician

## 2022-10-25 ENCOUNTER — Encounter: Payer: Self-pay | Admitting: Dietician

## 2022-10-25 VITALS — Ht 66.0 in | Wt 241.8 lb

## 2022-10-25 DIAGNOSIS — E669 Obesity, unspecified: Secondary | ICD-10-CM | POA: Insufficient documentation

## 2022-10-25 NOTE — Progress Notes (Signed)
Supervised Weight Loss Visit Bariatric Nutrition Education Appt Start Time: 9:20   End Time: 9:47  Planned surgery: RYGB  4 out of 6 SWL Appointments   NUTRITION ASSESSMENT   Anthropometrics  Start weight at [facility]: 235.7 lbs (date: 07/03/2022)  Height: 66 in Weight today: 241.4 lbs. BMI: 39.11 kg/m2     Clinical  Medical hx: GERD, HTN, obesity, sleep apnea, depression, anxiety, arthritis, history of kidney stones Medications: Bupropion, Robaxin, famotidine, amlodipine besylate, pantoprazole SOD, fusion, vilazodone, ubrelvy, zembrace, Trulicity   Labs: Trig [id number] Notable signs/symptoms: none noted Any previous deficiencies? No  Lifestyle & Dietary Hx  Patient states she is not taking vit D  Patient states her insurance canceled on the 28 February she doesn't have an income now, stating she will research for a new insurance to cover for her surgery.  Patient states receiving check from workers comp. Patient states still having physical ailments ,mainly in her neck, she starts working with her doctor to fix it. Patient states not physical activity yet. Patient states she prefers fruit in the summer time. Pt states she has cut out sparkling ice and energy drinks. Pt states it is still hard not drinking with meals, stating she still practicing it.  Estimated daily fluid intake: 64 oz per day Supplements: Vit D Current average weekly physical activity: ADLs  24-Hr Dietary Recall First Meal: 2 eggs, toast with sugar free jelly, coffee or oatmeal Snack:  Second Meal: chicken or [place] sandwich with whole wheat bread. Snack:  Third Meal: Spaghetti (whole wheat) or mac and cheese or rice or roast and vegetables or salmon or baked chicken or cubed steak, green beans, broccoli, asparagus, brussels sprouts. Snack: something sweet, pumpkin treat like pie Beverages: No any more  zero sugar energy drink, coffee, No carbonated drinks, electrolyte Gatorade, water 65 oz a day    Estimated  Energy Needs Calories: 1600  NUTRITION DIAGNOSIS  Overweight/obesity ([place]-3.3) related to past poor dietary habits and physical inactivity as evidenced by patient w/ planned RYGB surgery following dietary guidelines for continued weight loss.  NUTRITION INTERVENTION  Nutrition counseling (C-1) and education (E-2) to facilitate bariatric surgery goals.  Why you need complex carbohydrates: Whole grains and other complex carbohydrates are required to have a healthy diet. Whole grains provide fiber which can help with blood glucose levels and help keep you satiated. Fruits and starchy vegetables provide essential vitamins and minerals required for immune function, eyesight support, brain support, bone density, wound healing and many other functions within the body. According to the current evidenced based 2020-2025 Dietary Guidelines for Americans, complex carbohydrates are part of a healthy eating pattern which is associated with a decreased risk for type 2 diabetes, cancers, and cardiovascular disease.  Encouraged pt to continue to eat balanced meals inclusive of non starchy vegetables 2 times a day 7 days a week Encouraged pt to choose lean protein sources: limiting beef, pork, sausage, hotdogs, and lunch meat Encourage pt to choose healthy fats such as plant based limiting animal fats Encouraged pt to continue to drink a minium 64 fluid ounces with half being plain water to satisfy proper hydration    Pre-Op Goals Progress & New Goals Continue: Consume 3 meals per day or try to eat every 3-5 hours Continue: Aim for 64-100 ounces of FLUID daily (with at least half of fluid intake being plain water)  Continue: Aim for at least 60-80 grams of PROTEIN daily Continue: Practice CHEWING your food (aim for applesauce consistency) Continue: Make healthy  food choices while monitoring portion sizes; use the meal ideas handout Continue: Practice not drinking 15 minutes before, during, and 30 minutes after  each meal and snack New: Increase non -starchy vegetables at each meal (at least  2 servings a day). New: Work on [employer] that cover the surgery.  Handouts Provided Include   Learning Style & Readiness for Change Teaching method utilized: Visual & Auditory  Demonstrated degree of understanding via: Teach Back  Readiness Level: preparation Barriers to learning/adherence to lifestyle change: fear and anxiety of surgery  RD's Notes for next Visit  Patient progress toward chosen goals Discuss Fat (Saturated vs. Unsaturated) Increasing fluid intake  MONITORING & EVALUATION Dietary intake, weekly physical activity, body weight, and pre-op goals.   Next Steps  Patient is to return to [facility] in 1 month for next SWL visit.

## 2022-10-31 ENCOUNTER — Ambulatory Visit: Payer: Self-pay | Admitting: Neurology

## 2022-11-01 ENCOUNTER — Encounter: Payer: Self-pay | Admitting: Internal Medicine

## 2022-11-18 ENCOUNTER — Other Ambulatory Visit: Payer: Self-pay | Admitting: Family Medicine
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
## 2022-11-20 ENCOUNTER — Ambulatory Visit (INDEPENDENT_AMBULATORY_CARE_PROVIDER_SITE_OTHER): Payer: 59 | Admitting: Neurology

## 2022-11-20 ENCOUNTER — Encounter: Payer: 59 | Attending: Surgery | Admitting: Dietician

## 2022-11-20 ENCOUNTER — Encounter: Payer: Self-pay | Admitting: Dietician

## 2022-11-20 ENCOUNTER — Encounter: Payer: Self-pay | Admitting: Neurology

## 2022-11-20 VITALS — BP 129/91 | HR 97 | Ht 66.0 in | Wt 247.4 lb

## 2022-11-20 VITALS — Ht 66.0 in | Wt 246.8 lb

## 2022-11-20 DIAGNOSIS — G43009 Migraine without aura, not intractable, without status migrainosus: Secondary | ICD-10-CM

## 2022-11-20 DIAGNOSIS — E669 Obesity, unspecified: Secondary | ICD-10-CM | POA: Diagnosis present

## 2022-11-20 MED ORDER — UBRELVY 100 MG PO TABS: ORAL_TABLET | ORAL | 11 refills | Status: DC

## 2022-11-20 NOTE — Progress Notes (Addendum)
Supervised Weight Loss Visit Bariatric Nutrition Education Appt Start Time: 2:00   End Time: 2:25  Planned surgery: RYGB  5 out of 10 SWL Appointments   NUTRITION ASSESSMENT   Anthropometrics  Start weight at [facility]: 235.7 lbs (date: 07/03/2022)  Height: 66 in Weight today: 246.8 lbs. BMI: 39.83 kg/m2     Clinical  Medical hx: GERD, HTN, obesity, sleep apnea, depression, anxiety, arthritis, history of kidney stones Medications: Bupropion, Robaxin, famotidine, amlodipine besylate, pantoprazole SOD, fusion, vilazodone, ubrelvy, zembrace, Trulicity   Labs: Trig [id number] Notable signs/symptoms: none noted Any previous deficiencies? No  Lifestyle & Dietary Hx  Pt states she is not taking her vit D, stating she is going home today and take it. Pt states she has fallen off the wagon, stating she is getting back on track. Pt states she is starting a new day today. 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 Pt states she is injured everywhere, stating she is going to try some chair exercises.  Pt states not drinking with meals is difficult for her.  Estimated daily fluid intake: 64 oz per day Supplements: Vit D Current average weekly physical activity: ADLs  24-Hr Dietary Recall First Meal: 2 eggs, toast with sugar free jelly, coffee or oatmeal Snack:  Second Meal: chicken or [place] sandwich with whole wheat bread. Snack:  Third Meal: Spaghetti (whole wheat) or mac and cheese or rice or roast and vegetables or salmon or baked chicken or cubed steak, green beans, broccoli, asparagus, brussels sprouts. Snack: something sweet, pumpkin treat like pie Beverages: No any more  zero sugar energy drink, coffee, No carbonated drinks, electrolyte Gatorade, water 65 oz a day    Estimated Energy Needs Calories: 1600  NUTRITION DIAGNOSIS  Overweight/obesity ([place]-3.3) related to past poor dietary habits and physical inactivity as evidenced  by patient w/ planned RYGB surgery following dietary guidelines for continued weight loss.  NUTRITION INTERVENTION  Nutrition counseling (C-1) and education (E-2) to facilitate bariatric surgery goals.  Why you need complex carbohydrates: Whole grains and other complex carbohydrates are required to have a healthy diet. Whole grains provide fiber which can help with blood glucose levels and help keep you satiated. Fruits and starchy vegetables provide essential vitamins and minerals required for immune function, eyesight support, brain support, bone density, wound healing and many other functions within the body. According to the current evidenced based 2020-2025 Dietary Guidelines for Americans, complex carbohydrates are part of a healthy eating pattern which is associated with a decreased risk for type 2 diabetes, cancers, and cardiovascular disease.  Encouraged pt to continue to eat balanced meals inclusive of non starchy vegetables 2 times a day 7 days a week Encouraged pt to choose lean protein sources: limiting beef, pork, sausage, hotdogs, and lunch meat Encourage pt to choose healthy fats such as plant based limiting animal fats Encouraged pt to continue to drink a minium 64 fluid ounces with half being plain water to satisfy proper hydration    Immediate Benefits for Adults A single bout of moderate-to vigorous physical activity provides immediate benefits for your health. Sleep - Improves sleep quality Less Anxiety - Reduces feelings of anxiety Blood Pressure - Reduces blood pressure  Long-term Benefits for Adults Regular physical activity provides important health benefits for chronic disease prevention. Brain Health - Reduces risks of developing dementia (including Alzheimer's disease) and reduces risk of depression Heart Health - Lowers risk of heart disease, stroke,  and type 2 diabetes Cancer Prevention -Lowers risk of eight cancers: bladder, breast, colon, endometrium,  esophagus, kidney, lung, and stomach Healthy Weight - Reduces risk of weight gain Bone Strength - Improves bone health Balance and Coordination - Reduces risks of falls  Pre-Op Goals Progress & New Goals Continue: Consume 3 meals per day or try to eat every 3-5 hours Continue: Aim for 64-100 ounces of FLUID daily (with at least half of fluid intake being plain water)  Continue: Aim for at least 60-80 grams of PROTEIN daily Continue: Practice CHEWING your food (aim for applesauce consistency) Continue: Make healthy food choices while monitoring portion sizes; use the meal ideas handout Continue: Practice not drinking 15 minutes before, during, and 30 minutes after each meal and snack Continue: Increase non -starchy vegetables at each meal (at least  2 servings a day). New: increase physical activity; try chair exercises and walking, 30 minutes a day. New: take vit D  Handouts Provided Include  Pre-Op Goals  Learning Style & Readiness for Change Teaching method utilized: Visual & Auditory  Demonstrated degree of understanding via: Teach Back  Readiness Level: preparation Barriers to learning/adherence to lifestyle change: fear and anxiety of surgery  RD's Notes for next Visit  Patient progress toward chosen goals Discuss Fat (Saturated vs. Unsaturated) Increasing fluid intake  MONITORING & EVALUATION Dietary intake, weekly physical activity, body weight, and pre-op goals.   Next Steps  Patient is to return to [facility] in 1 week for next SWL visit.

## 2022-11-22 ENCOUNTER — Ambulatory Visit: Payer: BC Managed Care – PPO | Admitting: Dietician

## 2022-11-22 ENCOUNTER — Telehealth: Payer: Self-pay

## 2022-11-22 NOTE — Telephone Encounter (Signed)
PA needed for Urbelvy 100 mg

## 2022-11-26 ENCOUNTER — Encounter: Payer: 59 | Attending: Surgery | Admitting: Dietician

## 2022-11-26 ENCOUNTER — Encounter: Payer: Self-pay | Admitting: Dietician

## 2022-11-26 VITALS — Ht 66.0 in | Wt 244.7 lb

## 2022-11-26 DIAGNOSIS — E669 Obesity, unspecified: Secondary | ICD-10-CM | POA: Insufficient documentation

## 2022-11-26 NOTE — Progress Notes (Signed)
Supervised Weight Loss Visit Bariatric Nutrition Education Appt Start Time: 9:43  End Time: 10:05   Planned surgery: RYGB  6 out of 10 SWL Appointments   NUTRITION ASSESSMENT   Anthropometrics  Start weight at [facility]: 235.7 lbs (date: 07/03/2022)  Height: 66 in Weight today: 244.7lbs. BMI: 39.5 kg/m2     Clinical  Medical hx: GERD, HTN, obesity, sleep apnea, depression, anxiety, arthritis, history of kidney stones Medications: Bupropion, Robaxin, famotidine, amlodipine besylate, pantoprazole SOD, fusion, vilazodone, ubrelvy, zembrace, Trulicity   Labs: Trig 191 Notable signs/symptoms: none noted Any previous deficiencies? No  Lifestyle & Dietary Hx Pt states she started taking vit D again Pt stated fluid intake is going good Pt states she is still smoking (cigarettes)- had quit and then picked it back up again. Pt states she is currently smoking 10 x day. Pt states she has her medication ready (Chantex) that helped her quit last time.  Pt states she is worried about stopping ibprofuen- has some long lasting pain from previous accident but is getting an injection to help with pain.  Pt states she has not started the chair exercises- knee pain still bothering her.  Pt states she is getting enough protein in- at least 3 oz with each meal Pt states she is trying to not drink with meals but it is difficult for her- states she is going to try to keep water away from her during meals.  Pt states she is still experiencing some reflux when she doesn't take her medicine    Estimated daily fluid intake: 64 oz per day Supplements: Vit D Current average weekly physical activity: ADLs  24-Hr Dietary Recall First Meal: 2 eggs, toast with sugar free jelly, coffee or oatmeal Snack:  Second Meal: chicken or [place] sandwich with whole wheat bread. Snack:  Third Meal: Spaghetti (whole wheat) or mac and cheese or rice or roast and vegetables or salmon or baked chicken or cubed steak, green beans,  broccoli, asparagus, brussels sprouts. Snack: something sweet, pumpkin treat like pie Beverages: No any more  zero sugar energy drink, coffee, No carbonated drinks, electrolyte Gatorade, water 65 oz a day    Estimated Energy Needs Calories: 1600  NUTRITION DIAGNOSIS  Overweight/obesity ([place]-3.3) related to past poor dietary habits and physical inactivity as evidenced by patient w/ planned RYGB surgery following dietary guidelines for continued weight loss.  NUTRITION INTERVENTION  Nutrition counseling (C-1) and education (E-2) to facilitate bariatric surgery goals.  Why you need complex carbohydrates: Whole grains and other complex carbohydrates are required to have a healthy diet. Whole grains provide fiber which can help with blood glucose levels and help keep you satiated. Fruits and starchy vegetables provide essential vitamins and minerals required for immune function, eyesight support, brain support, bone density, wound healing and many other functions within the body. According to the current evidenced based 2020-2025 Dietary Guidelines for Americans, complex carbohydrates are part of a healthy eating pattern which is associated with a decreased risk for type 2 diabetes, cancers, and cardiovascular disease.  Encouraged pt to continue to eat balanced meals inclusive of non starchy vegetables 2 times a day 7 days a week Encouraged pt to choose lean protein sources: limiting beef, pork, sausage, hotdogs, and lunch meat Encourage pt to choose healthy fats such as plant based limiting animal fats Encouraged pt to continue to drink a minium 64 fluid ounces with half being plain water to satisfy proper hydration    Immediate Benefits for Adults A single bout of moderate-to  vigorous physical activity provides immediate benefits for your health. Sleep - Improves sleep quality Less Anxiety - Reduces feelings of anxiety Blood Pressure - Reduces blood pressure  Long-term Benefits for  Adults Regular physical activity provides important health benefits for chronic disease prevention. Brain Health - Reduces risks of developing dementia (including Alzheimer's disease) and reduces risk of depression Heart Health - Lowers risk of heart disease, stroke, and type 2 diabetes Cancer Prevention -Lowers risk of eight cancers: bladder, breast, colon, endometrium, esophagus, kidney, lung, and stomach Healthy Weight - Reduces risk of weight gain Bone Strength - Improves bone health Balance and Coordination - Reduces risks of falls  Pre-Op Goals Progress & New Goals Continue: Consume 3 meals per day or try to eat every 3-5 hours Continue: Aim for 64-100 ounces of FLUID daily (with at least half of fluid intake being plain water)  Continue: Aim for at least 60-80 grams of PROTEIN daily Continue: Practice CHEWING your food (aim for applesauce consistency) Continue: Make healthy food choices while monitoring portion sizes; use the meal ideas handout Re-engage: Practice not drinking 15 minutes before, during, and 30 minutes after each meal and snack Continue: Increase non -starchy vegetables at each meal (at least  2 servings a day). Re-engage: increase physical activity; try chair exercises and walking, 30 minutes a day. Continue: take vit D New: smoking cessation  Handouts Provided Include  Bariatric MyPlate  Learning Style & Readiness for Change Teaching method utilized: Visual & Auditory  Demonstrated degree of understanding via: Teach Back  Readiness Level: preparation Barriers to learning/adherence to lifestyle change: fear and anxiety of surgery  RD's Notes for next Visit  Patient progress toward chosen goals Discuss Fat (Saturated vs. Unsaturated) Increasing fluid intake  MONITORING & EVALUATION Dietary intake, weekly physical activity, body weight, and pre-op goals.   Next Steps  Patient is to return to [facility] in 1 week for next SWL visit.

## 2022-12-03 ENCOUNTER — Encounter: Payer: Self-pay | Admitting: Dietician

## 2022-12-03 ENCOUNTER — Encounter: Payer: 59 | Attending: Surgery | Admitting: Dietician

## 2022-12-03 VITALS — Ht 66.0 in | Wt 243.0 lb

## 2022-12-03 DIAGNOSIS — E669 Obesity, unspecified: Secondary | ICD-10-CM | POA: Insufficient documentation

## 2022-12-03 NOTE — Progress Notes (Signed)
Supervised Weight Loss Visit Bariatric Nutrition Education Appt Start Time: 10:59  End Time: 11:25  Planned surgery: RYGB  7 out of 10 SWL Appointments   NUTRITION ASSESSMENT   Anthropometrics  Start weight at [facility]: 235.7 lbs (date: 07/03/2022)  Height: 66 in Weight today: 243.0 lbs. BMI: 39.22 kg/m2     Clinical  Medical hx: GERD, HTN, obesity, sleep apnea, depression, anxiety, arthritis, history of kidney stones Medications: Bupropion, Robaxin, famotidine, amlodipine besylate, pantoprazole SOD, fusion, vilazodone, ubrelvy, zembrace, Trulicity   Labs: Trig 191 Notable signs/symptoms: none noted Any previous deficiencies? No  Lifestyle & Dietary Hx  Pt states she has 2 root canals coming up, stating it is the 24th. Pt states she started taking Trulicity again. Pt states she is more mindful of not smoking, stating the date for surgery is coming soon.  Pt states she needs to start taking medication to take the cravings away and not want to smoke. Pt states she quit cold [place] the last time she quit smoking (>10 years ago), stating she picked it up again August of last summer. Pt states she has reduced her cigarettes since last visit.  Estimated daily fluid intake: 64 oz per day Supplements: Vit D Current average weekly physical activity: ADLs; chair exercises 3 days this week, for 20-30 minutes.  24-Hr Dietary Recall First Meal: 2 eggs, toast with sugar free jelly, coffee or oatmeal Snack:  Second Meal: chicken or [place] sandwich with whole wheat bread. Snack:  Third Meal: Spaghetti (whole wheat) or mac and cheese or rice or roast and vegetables or salmon or baked chicken or cubed steak, green beans, broccoli, asparagus, brussels sprouts. Snack: something sweet, pumpkin treat like pie Beverages: No more  zero sugar energy drink, coffee, No carbonated drinks, electrolyte Gatorade, water 65 oz a day   Estimated Energy Needs Calories: 1600  NUTRITION DIAGNOSIS   Overweight/obesity ([place]-3.3) related to past poor dietary habits and physical inactivity as evidenced by patient w/ planned RYGB surgery following dietary guidelines for continued weight loss.  NUTRITION INTERVENTION  Nutrition counseling (C-1) and education (E-2) to facilitate bariatric surgery goals.  Why you need complex carbohydrates: Whole grains and other complex carbohydrates are required to have a healthy diet. Whole grains provide fiber which can help with blood glucose levels and help keep you satiated. Fruits and starchy vegetables provide essential vitamins and minerals required for immune function, eyesight support, brain support, bone density, wound healing and many other functions within the body. According to the current evidenced based 2020-2025 Dietary Guidelines for Americans, complex carbohydrates are part of a healthy eating pattern which is associated with a decreased risk for type 2 diabetes, cancers, and cardiovascular disease.  Encouraged pt to continue to eat balanced meals inclusive of non starchy vegetables 2 times a day 7 days a week Encouraged pt to choose lean protein sources: limiting beef, pork, sausage, hotdogs, and lunch meat Encourage pt to choose healthy fats such as plant based limiting animal fats Encouraged pt to continue to drink a minium 64 fluid ounces with half being plain water to satisfy proper hydration    Immediate Benefits for Adults A single bout of moderate-to vigorous physical activity provides immediate benefits for your health. Sleep - Improves sleep quality Less Anxiety - Reduces feelings of anxiety Blood Pressure - Reduces blood pressure  Long-term Benefits for Adults Regular physical activity provides important health benefits for chronic disease prevention. Brain Health - Reduces risks of developing dementia (including Alzheimer's disease) and reduces risk of  depression Heart Health - Lowers risk of heart disease, stroke, and type 2  diabetes Cancer Prevention -Lowers risk of eight cancers: bladder, breast, colon, endometrium, esophagus, kidney, lung, and stomach Healthy Weight - Reduces risk of weight gain Bone Strength - Improves bone health Balance and Coordination - Reduces risks of falls Encouraged patient to honor their body's internal hunger and fullness cues.  Throughout the day, check in mentally and rate hunger. Stop eating when satisfied not full regardless of how much food is left on the plate.  Get more if still hungry 20-30 minutes later.  The key is to honor satisfaction so throughout the meal, rate fullness factor and stop when comfortably satisfied not physically full. The key is to honor hunger and fullness without any feelings of guilt or shame.  Pay attention to what the internal cues are, rather than any external factors. This will enhance the confidence you have in listening to your own body and following those internal cues enabling you to increase how often you eat when you are hungry not out of appetite and stop when you are satisfied not full.   Pre-Op Goals Progress & New Goals Continue: Consume 3 meals per day or try to eat every 3-5 hours Continue: Aim for 64-100 ounces of FLUID daily (with at least half of fluid intake being plain water)  Continue: Aim for at least 60-80 grams of PROTEIN daily Continue: Practice CHEWING your food (aim for applesauce consistency) Continue: Make healthy food choices while monitoring portion sizes; use the meal ideas handout Continue: Practice not drinking 15 minutes before, during, and 30 minutes after each meal and snack Continue: Increase non -starchy vegetables at each meal (at least  2 servings a day). Continue: increase physical activity; try chair exercises and walking, 30 minutes a day. Continue: take vit D Engage/Continue: smoking cessation New: get rid of distractions during meals and snacks; listen to your body; aim for satisfaction instead of  fullness.  Handouts Provided Include  Bariatric MyPlate  Learning Style & Readiness for Change Teaching method utilized: Visual & Auditory  Demonstrated degree of understanding via: Teach Back  Readiness Level: preparation Barriers to learning/adherence to lifestyle change: fear and anxiety of surgery  RD's Notes for next Visit  Patient progress toward chosen goals Discuss Fat (Saturated vs. Unsaturated) Increasing fluid intake  MONITORING & EVALUATION Dietary intake, weekly physical activity, body weight, and pre-op goals.   Next Steps  Patient is to return to [facility] in 1 week for next SWL visit.

## 2022-12-10 ENCOUNTER — Other Ambulatory Visit: Payer: Self-pay | Admitting: Family Medicine

## 2022-12-10 DIAGNOSIS — I10 Essential (primary) hypertension: Secondary | ICD-10-CM

## 2022-12-11 ENCOUNTER — Encounter: Payer: Self-pay | Admitting: Dietician

## 2022-12-11 ENCOUNTER — Encounter: Payer: 59 | Admitting: Dietician

## 2022-12-11 VITALS — Ht 66.0 in | Wt 247.8 lb

## 2022-12-11 DIAGNOSIS — E669 Obesity, unspecified: Secondary | ICD-10-CM

## 2022-12-11 NOTE — Progress Notes (Signed)
Supervised Weight Loss Visit Bariatric Nutrition Education Appt Start Time: 9:52  End Time: 10:16  Planned surgery: RYGB  8 out of 10 SWL Appointments   NUTRITION ASSESSMENT   Anthropometrics  Start weight at [facility]: 235.7 lbs (date: 07/03/2022)  Height: 66 in Weight today: 247.8 lbs. BMI: 40.00 kg/m2     Clinical  Medical hx: GERD, HTN, obesity, sleep apnea, depression, anxiety, arthritis, history of kidney stones Medications: Bupropion, Robaxin, famotidine, amlodipine besylate, pantoprazole SOD, fusion, vilazodone, ubrelvy, zembrace, Trulicity   Labs: Trig 191 Notable signs/symptoms: none noted Any previous deficiencies? No  Lifestyle & Dietary Hx  Pt states she has her root canal tomorrow. Pt states she is taking smoking cessation medication, stating she is planning to stop this month, stating her birthday celebrations are over. Pt states her sister has had by-pass surgery in the 90's Pt states she did well listening to her body while eating and trying to get to the point of satisfaction instead of fullness.  Estimated daily fluid intake: 64 oz per day Supplements: Vit D Current average weekly physical activity: ADLs; chair exercises 3 days this week, for 20-30 minutes.  24-Hr Dietary Recall First Meal: 2 eggs, toast with sugar free jelly, coffee or oatmeal Snack:  Second Meal: chicken or [place] sandwich with whole wheat bread. Snack:  Third Meal: salmon, zucchini  Snack: something sweet, pumpkin treat like pie Beverages: No more  zero sugar energy drink, coffee, No carbonated drinks, electrolyte Gatorade, water 65 oz a day   Estimated Energy Needs Calories: 1600  NUTRITION DIAGNOSIS  Overweight/obesity ([place]-3.3) related to past poor dietary habits and physical inactivity as evidenced by patient w/ planned RYGB surgery following dietary guidelines for continued weight loss.  NUTRITION INTERVENTION  Nutrition counseling (C-1) and education (E-2) to facilitate  bariatric surgery goals.  Why you need complex carbohydrates: Whole grains and other complex carbohydrates are required to have a healthy diet. Whole grains provide fiber which can help with blood glucose levels and help keep you satiated. Fruits and starchy vegetables provide essential vitamins and minerals required for immune function, eyesight support, brain support, bone density, wound healing and many other functions within the body. According to the current evidenced based 2020-2025 Dietary Guidelines for Americans, complex carbohydrates are part of a healthy eating pattern which is associated with a decreased risk for type 2 diabetes, cancers, and cardiovascular disease.  Encouraged pt to continue to eat balanced meals inclusive of non starchy vegetables 2 times a day 7 days a week Encouraged pt to choose lean protein sources: limiting beef, pork, sausage, hotdogs, and lunch meat Encourage pt to choose healthy fats such as plant based limiting animal fats Encouraged pt to continue to drink a minium 64 fluid ounces with half being plain water to satisfy proper hydration    Immediate Benefits for Adults A single bout of moderate-to vigorous physical activity provides immediate benefits for your health. Sleep - Improves sleep quality Less Anxiety - Reduces feelings of anxiety Blood Pressure - Reduces blood pressure  Long-term Benefits for Adults Regular physical activity provides important health benefits for chronic disease prevention. Brain Health - Reduces risks of developing dementia (including Alzheimer's disease) and reduces risk of depression Heart Health - Lowers risk of heart disease, stroke, and type 2 diabetes Cancer Prevention -Lowers risk of eight cancers: bladder, breast, colon, endometrium, esophagus, kidney, lung, and stomach Healthy Weight - Reduces risk of weight gain Bone Strength - Improves bone health Balance and Coordination - Reduces risks of falls  Encouraged  patient to honor their body's internal hunger and fullness cues.  Throughout the day, check in mentally and rate hunger. Stop eating when satisfied not full regardless of how much food is left on the plate.  Get more if still hungry 20-30 minutes later.  The key is to honor satisfaction so throughout the meal, rate fullness factor and stop when comfortably satisfied not physically full. The key is to honor hunger and fullness without any feelings of guilt or shame.  Pay attention to what the internal cues are, rather than any external factors. This will enhance the confidence you have in listening to your own body and following those internal cues enabling you to increase how often you eat when you are hungry not out of appetite and stop when you are satisfied not full.   Pre-Op Goals Progress & New Goals Continue: Consume 3 meals per day or try to eat every 3-5 hours Continue: Aim for 64-100 ounces of FLUID daily (with at least half of fluid intake being plain water)  Continue: Aim for at least 60-80 grams of PROTEIN daily Continue: Practice CHEWING your food (aim for applesauce consistency) Continue: Make healthy food choices while monitoring portion sizes; use the meal ideas handout Continue: Practice not drinking 15 minutes before, during, and 30 minutes after each meal and snack Continue: Increase non -starchy vegetables at each meal (at least  2 servings a day). Continue: increase physical activity; try chair exercises and walking, 30 minutes a day. Continue: take vit D Continue: smoking cessation Continue: get rid of distractions during meals and snacks; listen to your body; aim for satisfaction instead of fullness. New: use small utensils and plates New: practice meal prepping; create meals for the week and freeze.  Handouts Provided Include   Learning Style & Readiness for Change Teaching method utilized: Visual & Auditory  Demonstrated degree of understanding via: Teach Back   Readiness Level: preparation Barriers to learning/adherence to lifestyle change: fear and anxiety of surgery  RD's Notes for next Visit  Patient progress toward chosen goals Discuss Fat (Saturated vs. Unsaturated) Increasing fluid intake  MONITORING & EVALUATION Dietary intake, weekly physical activity, body weight, and pre-op goals.   Next Steps  Patient is to return to [facility] in 1 week for next SWL visit.

## 2022-12-19 NOTE — Telephone Encounter (Signed)
PA already approved, please see previous encounter

## 2022-12-20 ENCOUNTER — Other Ambulatory Visit: Payer: Self-pay | Admitting: Family Medicine

## 2022-12-20 ENCOUNTER — Encounter: Payer: Self-pay | Admitting: Dietician

## 2022-12-20 ENCOUNTER — Encounter: Payer: 59 | Attending: Surgery | Admitting: Dietician

## 2022-12-20 VITALS — Ht 66.0 in | Wt 243.6 lb

## 2022-12-20 DIAGNOSIS — R109 Unspecified abdominal pain: Secondary | ICD-10-CM

## 2022-12-20 DIAGNOSIS — E669 Obesity, unspecified: Secondary | ICD-10-CM

## 2022-12-20 DIAGNOSIS — Z87442 Personal history of urinary calculi: Secondary | ICD-10-CM

## 2022-12-20 NOTE — Progress Notes (Signed)
Supervised Weight Loss Visit Bariatric Nutrition Education Appt Start Time: 9:29  End Time:  9:52  Planned surgery: RYGB  9 out of 10 SWL Appointments   NUTRITION ASSESSMENT   Anthropometrics  Start weight at [facility]: 235.7 lbs (date: 07/03/2022)  Height: 66 in Weight today: 243.6 lbs. BMI: 39.32 kg/m2     Clinical  Medical hx: GERD, HTN, obesity, sleep apnea, depression, anxiety, arthritis, history of kidney stones Medications: Bupropion, Robaxin, famotidine, amlodipine besylate, pantoprazole SOD, fusion, vilazodone, ubrelvy, zembrace, Trulicity   Labs: Trig 191 Notable signs/symptoms: none noted Any previous deficiencies? No  Lifestyle & Dietary Hx  Pt states she has about 10-12 cigarettes left, stating she refuses to buy any more when they are gone. Pt states she cooked for the family last night, stating she doesn't cook much. Pt states she has containers to meal prep now, stating she hasn't started yet. Pt agreeable to begin meal prepping before next visit. Pt states she is doing better with not drinking with her meals. Pt states she is taking a Vit D supplement once a week, stating it is prescribed.  Estimated daily fluid intake: 64 oz per day Supplements: Vit D Current average weekly physical activity: ADLs; chair exercises 3 days this week, for 20-30 minutes.  24-Hr Dietary Recall First Meal: 2 eggs, toast with sugar free jelly, coffee or oatmeal Snack:  Second Meal: chicken or [place] sandwich with whole wheat bread. Snack:  Third Meal: salmon, zucchini or roast, potatoes and carrots Snack: something sweet, pumpkin treat like pie Beverages: No more  zero sugar energy drink, coffee, No carbonated drinks, electrolyte Gatorade, water 65 oz a day, Starbucks iced coffee in bottle  Estimated Energy Needs Calories: 1600  NUTRITION DIAGNOSIS  Overweight/obesity ([place]-3.3) related to past poor dietary habits and physical inactivity as evidenced by patient w/ planned RYGB  surgery following dietary guidelines for continued weight loss.  NUTRITION INTERVENTION  Nutrition counseling (C-1) and education (E-2) to facilitate bariatric surgery goals.  Why you need complex carbohydrates: Whole grains and other complex carbohydrates are required to have a healthy diet. Whole grains provide fiber which can help with blood glucose levels and help keep you satiated. Fruits and starchy vegetables provide essential vitamins and minerals required for immune function, eyesight support, brain support, bone density, wound healing and many other functions within the body. According to the current evidenced based 2020-2025 Dietary Guidelines for Americans, complex carbohydrates are part of a healthy eating pattern which is associated with a decreased risk for type 2 diabetes, cancers, and cardiovascular disease.  Encouraged pt to continue to eat balanced meals inclusive of non starchy vegetables 2 times a day 7 days a week Encouraged pt to choose lean protein sources: limiting beef, pork, sausage, hotdogs, and lunch meat Encourage pt to choose healthy fats such as plant based limiting animal fats Encouraged pt to continue to drink a minium 64 fluid ounces with half being plain water to satisfy proper hydration    Encouraged pt to continue with all other goals discussed.  Encouraged patient to honor their body's internal hunger and fullness cues.  Throughout the day, check in mentally and rate hunger. Stop eating when satisfied not full regardless of how much food is left on the plate.  Get more if still hungry 20-30 minutes later.  The key is to honor satisfaction so throughout the meal, rate fullness factor and stop when comfortably satisfied not physically full. The key is to honor hunger and fullness without any feelings of  guilt or shame.  Pay attention to what the internal cues are, rather than any external factors. This will enhance the confidence you have in listening to your own  body and following those internal cues enabling you to increase how often you eat when you are hungry not out of appetite and stop when you are satisfied not full.   Pre-Op Goals Progress & New Goals Continue: Consume 3 meals per day or try to eat every 3-5 hours Continue: Aim for 64-100 ounces of FLUID daily (with at least half of fluid intake being plain water)  Continue: Aim for at least 60-80 grams of PROTEIN daily Continue: Practice CHEWING your food (aim for applesauce consistency) Continue: Make healthy food choices while monitoring portion sizes; use the meal ideas handout Continue: Practice not drinking 15 minutes before, during, and 30 minutes after each meal and snack Continue: increase physical activity; try chair exercises and walking, 30 minutes a day. Continue: take vit D Continue: smoking cessation Continue: get rid of distractions during meals and snacks; listen to your body; aim for satisfaction instead of fullness. Continue: use small utensils and plates Re-engage: practice meal prepping; create meals for the week and freeze. Re-engage: Increase non -starchy vegetables at each meal (at least  2 servings a day). New: avoid sugar-sweetened beverages; look at the labels; avoid Starbucks iced coffee in the bottle.  Handouts Provided Include   Learning Style & Readiness for Change Teaching method utilized: Visual & Auditory  Demonstrated degree of understanding via: Teach Back  Readiness Level: preparation Barriers to learning/adherence to lifestyle change: fear and anxiety of surgery  RD's Notes for next Visit  Patient progress toward chosen goals Discuss Fat (Saturated vs. Unsaturated) Increasing fluid intake  MONITORING & EVALUATION Dietary intake, weekly physical activity, body weight, and pre-op goals.   Next Steps  Patient is to return to [facility] in 1 week for next SWL visit.

## 2022-12-20 NOTE — Telephone Encounter (Signed)
Called to ask if she requested this as it was discontinued in October

## 2022-12-21 ENCOUNTER — Encounter: Payer: Self-pay | Admitting: Family Medicine

## 2022-12-24 ENCOUNTER — Ambulatory Visit: Payer: BC Managed Care – PPO | Admitting: Dietician

## 2022-12-27 ENCOUNTER — Encounter: Payer: Self-pay | Admitting: Dietician

## 2022-12-27 ENCOUNTER — Encounter: Payer: 59 | Attending: Surgery | Admitting: Dietician

## 2022-12-27 VITALS — Ht 66.0 in | Wt 246.5 lb

## 2022-12-27 DIAGNOSIS — E669 Obesity, unspecified: Secondary | ICD-10-CM | POA: Diagnosis not present

## 2022-12-27 NOTE — Progress Notes (Signed)
Supervised Weight Loss Visit Bariatric Nutrition Education Appt Start Time: 9:51  End Time:  10:12  Planned surgery: RYGB  10 out of 10 SWL Appointments   Pt completed visits.   Pt has cleared nutrition requirements.   NUTRITION ASSESSMENT   Anthropometrics  Start weight at [facility]: 235.7 lbs (date: 07/03/2022)  Height: 66 in Weight today: 246.5 lbs. BMI: 39.79 kg/m2     Clinical  Medical hx: GERD, HTN, obesity, sleep apnea, depression, anxiety, arthritis, history of kidney stones Medications: Bupropion, Robaxin, famotidine, amlodipine besylate, pantoprazole SOD, fusion, vilazodone, ubrelvy, zembrace, Trulicity   Labs: Trig 191 Notable signs/symptoms: none noted Any previous deficiencies? No  Lifestyle & Dietary Hx  Pt states she is 5 days without smoking cigarettes, stating she is trying to stop drinking her morning coffee, because it is a trigger to smoke.  Pt states she will replace the coffee with a protein shake or smoothie. Pt states she eats popcorn for snack, stating it is movie theater butter flavor. Pt agreeable to find a substitute. Pt states she has greatly improved drinking water.  Aslo stating she is doing well decreasing her portion sizes and identifying feelings of satisfaction vs fullness. Pt states she will focus more on drinking only water, stating she has done better with her water.  Estimated daily fluid intake: 64 oz per day Supplements: Vit D Current average weekly physical activity: ADLs; chair exercises 3 days this week, for 20-30 minutes.  24-Hr Dietary Recall First Meal: 2 eggs, whole wheat toast with sugar free jelly, coffee or oatmeal or smoothie (green apple, cucumber, spinach, lemon... with beets and carrots sometimes too) Snack:  Second Meal: chicken and sometimes with salad  or [place] sandwich with whole wheat bread. Snack:  Third Meal: salmon, zucchini or roast, potatoes and carrots Snack: popcorn (movie theater butter) Beverages: No more   zero sugar energy drink, coffee, No carbonated drinks, electrolyte Gatorade, water 65 oz a day, Starbucks iced coffee in bottle  Estimated Energy Needs Calories: 1600  NUTRITION DIAGNOSIS  Overweight/obesity ([place]-3.3) related to past poor dietary habits and physical inactivity as evidenced by patient w/ planned RYGB surgery following dietary guidelines for continued weight loss.  NUTRITION INTERVENTION  Nutrition counseling (C-1) and education (E-2) to facilitate bariatric surgery goals.  Why you need complex carbohydrates: Whole grains and other complex carbohydrates are required to have a healthy diet. Whole grains provide fiber which can help with blood glucose levels and help keep you satiated. Fruits and starchy vegetables provide essential vitamins and minerals required for immune function, eyesight support, brain support, bone density, wound healing and many other functions within the body. According to the current evidenced based 2020-2025 Dietary Guidelines for Americans, complex carbohydrates are part of a healthy eating pattern which is associated with a decreased risk for type 2 diabetes, cancers, and cardiovascular disease.  Encouraged pt to continue to eat balanced meals inclusive of non starchy vegetables 2 times a day 7 days a week Encouraged pt to choose lean protein sources: limiting beef, pork, sausage, hotdogs, and lunch meat Encourage pt to choose healthy fats such as plant based limiting animal fats Encouraged pt to continue to drink a minium 64 fluid ounces with half being plain water to satisfy proper hydration   Encouraged pt to continue with all other goals discussed. Encouraged patient to honor their body's internal hunger and fullness cues.  Throughout the day, check in mentally and rate hunger. Stop eating when satisfied not full regardless of how much food  is left on the plate.  Get more if still hungry 20-30 minutes later.  The key is to honor satisfaction so throughout  the meal, rate fullness factor and stop when comfortably satisfied not physically full. The key is to honor hunger and fullness without any feelings of guilt or shame.  Pay attention to what the internal cues are, rather than any external factors. This will enhance the confidence you have in listening to your own body and following those internal cues enabling you to increase how often you eat when you are hungry not out of appetite and stop when you are satisfied not full.  Pre-Op Goals Progress & New Goals Continue: Consume 3 meals per day or try to eat every 3-5 hours Continue: Aim for 64-100 ounces of FLUID daily (with at least half of fluid intake being plain water)  Continue: Aim for at least 60-80 grams of PROTEIN daily Continue: Practice CHEWING your food (aim for applesauce consistency) Continue: Make healthy food choices while monitoring portion sizes; use the meal ideas handout Continue: Practice not drinking 15 minutes before, during, and 30 minutes after each meal and snack Continue: increase physical activity; try chair exercises and walking, 30 minutes a day. Continue: take vit D Continue: smoking cessation Continue: get rid of distractions during meals and snacks; listen to your body; aim for satisfaction instead of fullness. Continue: use small utensils and plates Continue: practice meal prepping; create meals for the week and freeze. Continue: Increase non -starchy vegetables at each meal (at least  2 servings a day). Continue: avoid sugar-sweetened beverages; look at the labels; avoid Starbucks iced coffee in the bottle.  Handouts Provided Include   Learning Style & Readiness for Change Teaching method utilized: Visual & Auditory  Demonstrated degree of understanding via: Teach Back  Readiness Level: preparation Barriers to learning/adherence to lifestyle change: fear and anxiety of surgery  RD's Notes for next Visit  Patient progress toward chosen goals  MONITORING &  EVALUATION Dietary intake, weekly physical activity, body weight, and pre-op goals.   Next Steps  Pt has completed visits. No further supervised visits required/recommended. Patient is to return to [facility] for pre-op class >2 weeks prior to scheduled surgery.

## 2023-01-18 ENCOUNTER — Other Ambulatory Visit (HOSPITAL_COMMUNITY): Payer: Self-pay

## 2023-01-18 ENCOUNTER — Telehealth: Payer: Self-pay | Admitting: Pharmacy Technician

## 2023-01-18 NOTE — Telephone Encounter (Signed)
Patient Advocate Encounter  Received notification from [facility] that prior authorization for UBRELVY 100MG  is required.   PA submitted on 5.31.24 Key [id number] Status is pending

## 2023-01-21 DIAGNOSIS — F17200 Nicotine dependence, unspecified, uncomplicated: Secondary | ICD-10-CM | POA: Diagnosis not present

## 2023-01-23 NOTE — Telephone Encounter (Signed)
Patient Advocate Encounter  Prior Authorization for Bernita Raisin 100MG  tablets has been approved through [employer].    Key: [id number]  Effective: 01-18-2023 to 01-18-2024

## 2023-01-29 ENCOUNTER — Other Ambulatory Visit: Payer: Self-pay | Admitting: Neurology

## 2023-02-01 ENCOUNTER — Ambulatory Visit (INDEPENDENT_AMBULATORY_CARE_PROVIDER_SITE_OTHER): Payer: 59 | Admitting: Family Medicine

## 2023-02-01 ENCOUNTER — Encounter: Payer: Self-pay | Admitting: Family Medicine

## 2023-02-01 VITALS — BP 130/80 | HR 85 | Temp 97.9°F | Ht 66.0 in | Wt 251.2 lb

## 2023-02-01 DIAGNOSIS — Z87442 Personal history of urinary calculi: Secondary | ICD-10-CM

## 2023-02-01 DIAGNOSIS — R110 Nausea: Secondary | ICD-10-CM

## 2023-02-01 DIAGNOSIS — M6208 Separation of muscle (nontraumatic), other site: Secondary | ICD-10-CM

## 2023-02-01 DIAGNOSIS — E611 Iron deficiency: Secondary | ICD-10-CM | POA: Diagnosis not present

## 2023-02-01 MED ORDER — ONDANSETRON HCL 4 MG PO TABS: 4.0000 mg | ORAL_TABLET | Freq: Three times a day (TID) | ORAL | 0 refills | Status: AC | PRN

## 2023-02-01 MED ORDER — FUSION PLUS PO CAPS: 1.0000 | ORAL_CAPSULE | Freq: Every day | ORAL | Status: DC

## 2023-02-01 NOTE — Progress Notes (Signed)
   Subjective:    Patient ID: Emma Wagner, female    DOB: June 03, 1971, 52 y.o.   MRN: [id number]  HPI Abd bulge- pt reports that when she tries to sit up from a lying position she notices a bulge in her central abdomen.  Pt is concerned bc she has upcoming bariatric surgery.    Nausea- pt reports she has successfully quit smoking on Chantix but it causes her nausea.  She is asking for medication to take as needed.  Iron deficiency- pt reports she has been taking Fusion Plus capsules (prescribed by [employer]) but these are now backordered at [facility].  She was told by the pharmacy to discuss alternatives.   Review of Systems For ROS see HPI     Objective:   Physical Exam Vitals reviewed.  Constitutional:      General: She is not in acute distress.    Appearance: Normal appearance. She is obese. She is not ill-appearing.  Abdominal:     Hernia: No hernia is present.     Comments: Diastasis rectii  Skin:    General: Skin is warm and dry.  Neurological:     General: No focal deficit present.     Mental Status: She is alert and oriented to person, place, and time.  Psychiatric:        Mood and Affect: Mood normal.        Behavior: Behavior normal.        Thought Content: Thought content normal.           Assessment & Plan:  Nausea- pt successfully quit smoking on chantix but reports this causes nausea.  Zofran sent to the pharmacy.  Applauded pt for quitting.

## 2023-02-01 NOTE — Assessment & Plan Note (Signed)
New.  Pt has been on Fusion plus capsules as written by Lindaann Pascal.  Recently she has not been able to get them b/c [facility] told her they are backordered.  She can get them OTC but w/ her insurance, she doesn't pay anything for them.  Encouraged her to ask the pharmacist if they are available in another store or we can transfer the prescription to another pharmacy.  She is going to check with them and let me know.

## 2023-02-01 NOTE — Assessment & Plan Note (Signed)
New.  Reviewed dx w/ pt and discussed that this is not something that needs to be corrected.  If she does want it to improve, the first step would be PT and then possibly surgery if no improvement.  Pt is relieved and not interested in pursuing any tx at this time.

## 2023-02-01 NOTE — Patient Instructions (Addendum)
Follow up as needed or as scheduled There is nothing to worry about as far as the abdomen Let me know about the Fusion Plus and where I need to send it Call with any questions or concerns GOOD LUCK!!!

## 2023-02-14 ENCOUNTER — Other Ambulatory Visit: Payer: Self-pay | Admitting: Family Medicine

## 2023-02-14 DIAGNOSIS — R7303 Prediabetes: Secondary | ICD-10-CM | POA: Diagnosis not present

## 2023-02-14 DIAGNOSIS — Z87891 Personal history of nicotine dependence: Secondary | ICD-10-CM | POA: Diagnosis not present

## 2023-02-14 DIAGNOSIS — I10 Essential (primary) hypertension: Secondary | ICD-10-CM | POA: Diagnosis not present

## 2023-02-14 DIAGNOSIS — G4733 Obstructive sleep apnea (adult) (pediatric): Secondary | ICD-10-CM | POA: Diagnosis not present

## 2023-02-14 DIAGNOSIS — E669 Obesity, unspecified: Secondary | ICD-10-CM | POA: Diagnosis not present

## 2023-02-14 DIAGNOSIS — K219 Gastro-esophageal reflux disease without esophagitis: Secondary | ICD-10-CM | POA: Diagnosis not present

## 2023-02-18 ENCOUNTER — Encounter: Payer: Self-pay | Admitting: Skilled Nursing Facility1

## 2023-02-18 ENCOUNTER — Encounter: Payer: 59 | Attending: Surgery | Admitting: Skilled Nursing Facility1

## 2023-02-18 VITALS — Wt 252.8 lb

## 2023-02-18 DIAGNOSIS — E669 Obesity, unspecified: Secondary | ICD-10-CM | POA: Diagnosis not present

## 2023-02-18 NOTE — Progress Notes (Signed)
Pre-Operative Nutrition Class:    Patient was seen on 02/18/2023 for Pre-Operative Bariatric Surgery Education at [facility]    Surgery date: 03/05/2023 Surgery type: RYGB Start weight at [facility]: 235.7 Weight today: 252.8  Samples given per MNT protocol. Patient educated on appropriate usage:  procare Multivitamin Lot # [id number] Exp:06/26   Procare Calcium  Lot # [id number] Exp: 08/12/2023  The following the learning objectives were met by the patient during this course: Identify Pre-Op Dietary Goals and will begin 2 weeks pre-operatively Identify appropriate sources of fluids and proteins  State protein recommendations and appropriate sources pre and post-operatively Identify Post-Operative Dietary Goals and will follow for 2 weeks post-operatively Identify appropriate multivitamin and calcium sources Describe the need for physical activity post-operatively and will follow MD recommendations State when to call healthcare provider regarding medication questions or post-operative complications When having a diagnosis of diabetes understanding hypoglycemia symptoms and the inclusion of 1 complex carbohydrate per meal  Handouts given during class include: Pre-Op Bariatric Surgery Diet Handout Protein Shake Handout Post-Op Bariatric Surgery Nutrition Handout BELT Program Information Flyer Support Group Information Flyer [facility] Bariatric Supplements Price List  Follow-Up Plan: Patient will follow-up at [facility] 2 weeks post operatively for diet advancement per MD.

## 2023-02-20 NOTE — Progress Notes (Signed)
Surgery orders requested via Epic inbox. °

## 2023-02-21 ENCOUNTER — Ambulatory Visit: Payer: Self-pay | Admitting: Surgery

## 2023-02-21 DIAGNOSIS — Z01818 Encounter for other preprocedural examination: Secondary | ICD-10-CM

## 2023-02-26 DIAGNOSIS — Z130 Encounter for screening for diseases of the blood and blood-forming organs and certain disorders involving the immune mechanism: Secondary | ICD-10-CM | POA: Diagnosis not present

## 2023-02-26 DIAGNOSIS — N924 Excessive bleeding in the premenopausal period: Secondary | ICD-10-CM | POA: Diagnosis not present

## 2023-02-27 DIAGNOSIS — N920 Excessive and frequent menstruation with regular cycle: Secondary | ICD-10-CM | POA: Diagnosis not present

## 2023-02-27 NOTE — Progress Notes (Signed)
COVID Vaccine received:  []  No [x]  Yes Date of any COVID positive Test in last 90 days:  PCP - Neena Rhymes, MD Cardiologist -  Neurology- Shon Millet, DO  Chest x-ray - 07-03-2022  2v   Epic EKG -  07-02-2022  Epic Stress Test -  ECHO -  Cardiac Cath -   PCR screen: []  Ordered & Completed           []   No Order but Needs PROFEND           [x]   N/A for this surgery  Surgery Plan:  []  Ambulatory                            []  Outpatient in bed                            [x]  Admit  Anesthesia:    [x]  General  []  Spinal                           []   Choice []   MAC  Bowel Prep - []  No  []   Yes ___Bariatric diet___  Pacemaker / ICD device [x]  No []  Yes   Spinal Cord Stimulator:[x]  No []  Yes       History of Sleep Apnea? []  No [x]  Yes   CPAP used?- []  No [x]  Yes    Does the patient monitor blood sugar?          []  No []  Yes  []  N/A Last A1c on 08-31-2022 was 6.3  Patient has: []  NO Hx DM   [x]  Pre-DM                 []  DM1  []   DM2 Does patient have a [employer] or [employer]? []  No []  Yes   Fasting Blood Sugar Ranges-  Checks Blood Sugar _____ times a day  Blood Thinner / Instructions: none Aspirin Instructions: None  ERAS Protocol Ordered: []  No  [x]  Yes PRE-SURGERY []  ENSURE  []  G2  [x]  No Drink Ordered Patient is to be NPO after: 04:30  Comments:   Activity level: Patient is able / unable to climb a flight of stairs without difficulty; []  No CP  []  No SOB, but would have ___   Patient can / can not perform ADLs without assistance.   Anesthesia review: HTN, GERD, MDD, OSA-CPAP, smoker- just quit?, anemia, ADD, anxiety, PONV, Pre-DM (diet no meds)  Patient denies shortness of breath, fever, cough and chest pain at PAT appointment.  Patient verbalized understanding and agreement to the Pre-Surgical Instructions that were given to them at this PAT appointment. Patient was also educated of the need to review these PAT instructions again prior to her surgery.I  reviewed the appropriate phone numbers to call if they have any and questions or concerns.

## 2023-02-27 NOTE — Patient Instructions (Signed)
SURGICAL WAITING ROOM VISITATION Patients having surgery or a procedure may have no more than 2 support people in the waiting area - these visitors may rotate in the visitor waiting room.   Due to an increase in RSV and influenza rates and associated hospitalizations, children ages 20 and under may not visit patients in [facility] hospitals. If the patient needs to stay at the hospital during part of their recovery, the visitor guidelines for inpatient rooms apply.  PRE-OP VISITATION  Pre-op nurse will coordinate an appropriate time for 1 support person to accompany the patient in pre-op.  This support person may not rotate.  This visitor will be contacted when the time is appropriate for the visitor to come back in the pre-op area.  Please refer to the [facility] website for the visitor guidelines for Inpatients (after your surgery is over and you are in a regular room).  You are not required to quarantine at this time prior to your surgery. However, you must do this: Hand Hygiene often Do NOT share personal items Notify your provider if you are in close contact with someone who has COVID or you develop fever 100.4 or greater, new onset of sneezing, cough, sore throat, shortness of breath or body aches.  If you test positive for Covid or have been in contact with anyone that has tested positive in the last 10 days please notify you surgeon.    Your procedure is scheduled on:  Tuesday  March 05, 2023  Report to [facility] Main Entrance: [place] entrance where the [employer] is available.   Report to admitting at: 05:15    AM  Call this number if you have any questions or problems the morning of surgery [phone]  Do not eat food after Midnight the night prior to your surgery/procedure.  After Midnight you may have the following liquids until   04: 30     AM DAY OF SURGERY  Clear Liquid Diet Water Black Coffee (sugar ok, NO MILK/CREAM OR CREAMERS)  Tea (sugar ok, NO  MILK/CREAM OR CREAMERS) regular and decaf                             Plain Jell-O  with no fruit (NO RED)                                           Fruit ices (not with fruit pulp, NO RED)                                     Popsicles (NO RED)                                                                  Juice: NO CITRUS JUICES: only apple, WHITE grape, WHITE cranberry Sports drinks like Gatorade or Powerade (NO RED)             FOLLOW BOWEL PREP AND ANY ADDITIONAL PRE OP INSTRUCTIONS YOU RECEIVED FROM YOUR SURGEON'S OFFICE!!!   Oral Hygiene is  also important to reduce your risk of infection.        Remember - BRUSH YOUR TEETH THE MORNING OF SURGERY WITH YOUR REGULAR TOOTHPASTE  Do NOT smoke after Midnight the night before surgery.  Take ONLY these medicines the morning of surgery with A SIP OF WATER: Pantoprazole (Protonix), amlodipine, duloxetine (Cymbalta), vilazodone (Viibryd), Bupropion (Wellbutrin).    If You have been diagnosed with Sleep Apnea - Bring CPAP mask and tubing day of surgery. We will provide you with a CPAP machine on the day of your surgery.                   You may not have any metal on your body including hair pins, jewelry, and body piercing  Do not wear make-up, lotions, powders, perfumesor deodorant  Do not wear nail polish including gel and S&S, artificial / acrylic nails, or any other type of covering on natural nails including finger and toenails. If you have artificial nails, gel coating, etc., that needs to be removed by a nail salon, Please have this removed prior to surgery. Not doing so may mean that your surgery could be cancelled or delayed if the Surgeon or anesthesia staff feels like they are unable to monitor you safely.   Do not shave 48 hours prior to surgery to avoid nicks in your skin which may contribute to postoperative infections.    Contacts, Hearing Aids, dentures or bridgework may not be worn into surgery. DENTURES WILL BE REMOVED  PRIOR TO SURGERY PLEASE DO NOT APPLY "Poly grip" OR ADHESIVES!!!  You may bring a small overnight bag with you on the day of surgery, only pack items that are not valuable. [facility] IS NOT RESPONSIBLE   FOR VALUABLES THAT ARE LOST OR STOLEN.    Do not bring your home medications to the hospital. The Pharmacy will dispense medications listed on your medication list to you during your admission in the Hospital.  Special Instructions: Bring a copy of your healthcare power of attorney and living will documents the day of surgery, if you wish to have them scanned into your [facility]- EPIC  Please read over the following fact sheets you were given: IF YOU HAVE QUESTIONS ABOUT YOUR PRE-OP INSTRUCTIONS, PLEASE CALL [phone]   [facility] - Preparing for Surgery Before surgery, you can play an important role.  Because skin is not sterile, your skin needs to be as free of germs as possible.  You can reduce the number of germs on your skin by washing with CHG (chlorahexidine gluconate) soap before surgery.  CHG is an antiseptic cleaner which kills germs and bonds with the skin to continue killing germs even after washing. Please DO NOT use if you have an allergy to CHG or antibacterial soaps.  If your skin becomes reddened/irritated stop using the CHG and inform your nurse when you arrive at Short Stay. Do not shave (including legs and underarms) for at least 48 hours prior to the first CHG shower.  You may shave your face/neck.  Please follow these instructions carefully:  1.  Shower with CHG Soap the night before surgery and the  morning of surgery.  2.  If you choose to wash your hair, wash your hair first as usual with your normal  shampoo.  3.  After you shampoo, rinse your hair and body thoroughly to remove the shampoo.  4.  Use CHG as you would any other liquid soap.  You can apply chg directly to the skin and wash.  Gently with a scrungie or clean  washcloth.  5.  Apply the CHG Soap to your body ONLY FROM THE NECK DOWN.   Do not use on face/ open                           Wound or open sores. Avoid contact with eyes, ears mouth and genitals (private parts).                       Wash face,  Genitals (private parts) with your normal soap.             6.  Wash thoroughly, paying special attention to the area where your  surgery  will be performed.  7.  Thoroughly rinse your body with warm water from the neck down.  8.  DO NOT shower/wash with your normal soap after using and rinsing off the CHG Soap.            9.  Pat yourself dry with a clean towel.            10.  Wear clean pajamas.            11.  Place clean sheets on your bed the night of your first shower and do not  sleep with pets.  ON THE DAY OF SURGERY : Do not apply any lotions/deodorants the morning of surgery.  Please wear clean clothes to the hospital/surgery center.     FAILURE TO FOLLOW THESE INSTRUCTIONS MAY RESULT IN THE CANCELLATION OF YOUR SURGERY  PATIENT SIGNATURE_________________________________  NURSE SIGNATURE__________________________________  ________________________________________________________________________      Rogelia Mire    An incentive spirometer is a tool that can help keep your lungs clear and active. This tool measures how well you are filling your lungs with each breath. Taking long deep breaths may help reverse or decrease the chance of developing breathing (pulmonary) problems (especially infection) following: A long period of time when you are unable to move or be active. BEFORE THE PROCEDURE  If the spirometer includes an indicator to show your best effort, your nurse or respiratory therapist will set it to a desired goal. If possible, sit up straight or lean slightly forward. Try not to slouch. Hold the incentive spirometer in an upright position. INSTRUCTIONS FOR USE  Sit on the edge of your bed if possible, or sit  up as far as you can in bed or on a chair. Hold the incentive spirometer in an upright position. Breathe out normally. Place the mouthpiece in your mouth and seal your lips tightly around it. Breathe in slowly and as deeply as possible, raising the piston or the ball toward the top of the column. Hold your breath for 3-5 seconds or for as long as possible. Allow the piston or ball to fall to the bottom of the column. Remove the mouthpiece from your mouth and breathe out normally. Rest for a few seconds and repeat Steps 1 through 7 at least 10 times every 1-2 hours when you are awake. Take your time and take a few normal breaths between deep breaths. The spirometer may include an indicator to show your best effort. Use the indicator as a goal to work toward during each repetition. After each set of 10 deep breaths, practice coughing to be  sure your lungs are clear. If you have an incision (the cut made at the time of surgery), support your incision when coughing by placing a pillow or rolled up towels firmly against it. Once you are able to get out of bed, walk around indoors and cough well. You may stop using the incentive spirometer when instructed by your caregiver.  RISKS AND COMPLICATIONS Take your time so you do not get dizzy or light-headed. If you are in pain, you may need to take or ask for pain medication before doing incentive spirometry. It is harder to take a deep breath if you are having pain. AFTER USE Rest and breathe slowly and easily. It can be helpful to keep track of a log of your progress. Your caregiver can provide you with a simple table to help with this. If you are using the spirometer at home, follow these instructions: SEEK MEDICAL CARE IF:  You are having difficultly using the spirometer. You have trouble using the spirometer as often as instructed. Your pain medication is not giving enough relief while using the spirometer. You develop fever of 100.5 F (38.1 C) or  higher.                                                                                                    SEEK IMMEDIATE MEDICAL CARE IF:  You cough up bloody sputum that had not been present before. You develop fever of 102 F (38.9 C) or greater. You develop worsening pain at or near the incision site. MAKE SURE YOU:  Understand these instructions. Will watch your condition. Will get help right away if you are not doing well or get worse. Document Released: 12/17/2006 Document Revised: 10/29/2011 Document Reviewed: 02/17/2007 [facility] Patient Information 2014 [place], [place].

## 2023-02-28 ENCOUNTER — Encounter (HOSPITAL_COMMUNITY)
Admission: RE | Admit: 2023-02-28 | Discharge: 2023-02-28 | Disposition: A | Discharge status: Home / Self Care | Payer: 59 | Source: Ambulatory Visit | Attending: Surgery | Admitting: Surgery

## 2023-02-28 ENCOUNTER — Other Ambulatory Visit: Payer: Self-pay

## 2023-02-28 ENCOUNTER — Encounter (HOSPITAL_COMMUNITY): Payer: Self-pay

## 2023-02-28 DIAGNOSIS — Z01812 Encounter for preprocedural laboratory examination: Secondary | ICD-10-CM | POA: Insufficient documentation

## 2023-02-28 DIAGNOSIS — Z01818 Encounter for other preprocedural examination: Secondary | ICD-10-CM

## 2023-02-28 HISTORY — DX: Prediabetes: R73.03

## 2023-02-28 LAB — COMPREHENSIVE METABOLIC PANEL
ALT: 31 U/L (ref 0–44)
AST: 24 U/L (ref 15–41)
Albumin: 3.9 g/dL (ref 3.5–5.0)
Alkaline Phosphatase: 56 U/L (ref 38–126)
Anion gap: 7 (ref 5–15)
BUN: 19 mg/dL (ref 6–20)
CO2: 23 mmol/L (ref 22–32)
Calcium: 9 mg/dL (ref 8.9–10.3)
Chloride: 107 mmol/L (ref 98–111)
Creatinine, Ser: 0.96 mg/dL (ref 0.44–1.00)
GFR, Estimated: >60 mL/min (ref >60)
Glucose, Bld: 120 mg/dL — ABNORMAL HIGH (ref 70–99)
Potassium: 3.9 mmol/L (ref 3.5–5.1)
Sodium: 137 mmol/L (ref 135–145)
Total Bilirubin: 0.5 mg/dL (ref 0.3–1.2)
Total Protein: 7.5 g/dL (ref 6.5–8.1)

## 2023-02-28 LAB — CBC WITH DIFFERENTIAL/PLATELET
Abs Immature Granulocytes: 0.01 10*3/uL (ref 0.00–0.07)
Basophils Absolute: 0.1 10*3/uL (ref 0.0–0.1)
Basophils Relative: 1 %
Eosinophils Absolute: 0.2 10*3/uL (ref 0.0–0.5)
Eosinophils Relative: 2 %
HCT: 42 % (ref 36.0–46.0)
Hemoglobin: 13.4 g/dL (ref 12.0–15.0)
Immature Granulocytes: 0 %
Lymphocytes Relative: 33 %
Lymphs Abs: 3.1 10*3/uL (ref 0.7–4.0)
MCH: 30 pg (ref 26.0–34.0)
MCHC: 31.9 g/dL (ref 30.0–36.0)
MCV: 94.2 fL (ref 80.0–100.0)
Monocytes Absolute: 0.5 10*3/uL (ref 0.1–1.0)
Monocytes Relative: 5 %
Neutro Abs: 5.7 10*3/uL (ref 1.7–7.7)
Neutrophils Relative %: 59 %
Platelets: 328 10*3/uL (ref 150–400)
RBC: 4.46 MIL/uL (ref 3.87–5.11)
RDW: 13.8 % (ref 11.5–15.5)
WBC: 9.5 10*3/uL (ref 4.0–10.5)
nRBC: 0 % (ref 0.0–0.2)

## 2023-03-01 ENCOUNTER — Ambulatory Visit: Payer: BC Managed Care – PPO | Admitting: Family Medicine

## 2023-03-04 NOTE — Anesthesia Preprocedure Evaluation (Signed)
Anesthesia Evaluation  Patient identified by MRN, date of birth, ID band Patient awake    Reviewed: Allergy & Precautions, NPO status , Patient's Chart, lab work & pertinent test results  History of Anesthesia Complications (+) PONV and history of anesthetic complications  Airway Mallampati: III  TM Distance: >3 FB Neck ROM: Full    Dental no notable dental hx.    Pulmonary sleep apnea and Continuous Positive Airway Pressure Ventilation , former smoker   Pulmonary exam normal        Cardiovascular hypertension, Pt. on medications Normal cardiovascular exam     Neuro/Psych  PSYCHIATRIC DISORDERS Anxiety Depression    negative neurological ROS     GI/Hepatic Neg liver ROS,GERD  Medicated,,  Endo/Other  negative endocrine ROS    Renal/GU negative Renal ROS     Musculoskeletal negative musculoskeletal ROS (+)    Abdominal   Peds  Hematology negative hematology ROS (+)   Anesthesia Other Findings   Reproductive/Obstetrics                             Anesthesia Physical Anesthesia Plan  ASA: 3  Anesthesia Plan: General   Post-op Pain Management: Tylenol PO (pre-op)*, Toradol IV (intra-op)* and Lidocaine infusion*   Induction: Intravenous  PONV Risk Score and Plan: 4 or greater and Ondansetron, Dexamethasone, Scopolamine patch - Pre-op and Midazolam  Airway Management Planned: Oral ETT  Additional Equipment:   Intra-op Plan:   Post-operative Plan: Extubation in OR  Informed Consent: I have reviewed the patients History and Physical, chart, labs and discussed the procedure including the risks, benefits and alternatives for the proposed anesthesia with the patient or authorized representative who has indicated his/her understanding and acceptance.     Dental advisory given  Plan Discussed with: Anesthesiologist and CRNA  Anesthesia Plan Comments:         Anesthesia Quick  Evaluation

## 2023-03-05 ENCOUNTER — Other Ambulatory Visit: Payer: Self-pay

## 2023-03-05 ENCOUNTER — Inpatient Hospital Stay (HOSPITAL_COMMUNITY): Payer: 59 | Admitting: Anesthesiology

## 2023-03-05 ENCOUNTER — Encounter (HOSPITAL_COMMUNITY)
Admission: RE | Disposition: A | Discharge status: Home / Self Care | Payer: Self-pay | Source: Home / Self Care | Attending: Surgery

## 2023-03-05 ENCOUNTER — Encounter (HOSPITAL_COMMUNITY): Payer: Self-pay | Admitting: Surgery

## 2023-03-05 ENCOUNTER — Inpatient Hospital Stay (HOSPITAL_COMMUNITY)
Admission: RE | Admit: 2023-03-05 | Discharge: 2023-03-07 | DRG: 621 | Disposition: A | Discharge status: Home / Self Care | Payer: 59 | Attending: Surgery | Admitting: Surgery

## 2023-03-05 DIAGNOSIS — K219 Gastro-esophageal reflux disease without esophagitis: Secondary | ICD-10-CM | POA: Diagnosis present

## 2023-03-05 DIAGNOSIS — Z9049 Acquired absence of other specified parts of digestive tract: Secondary | ICD-10-CM | POA: Diagnosis not present

## 2023-03-05 DIAGNOSIS — Z8249 Family history of ischemic heart disease and other diseases of the circulatory system: Secondary | ICD-10-CM | POA: Diagnosis not present

## 2023-03-05 DIAGNOSIS — F418 Other specified anxiety disorders: Secondary | ICD-10-CM | POA: Diagnosis not present

## 2023-03-05 DIAGNOSIS — G4733 Obstructive sleep apnea (adult) (pediatric): Secondary | ICD-10-CM | POA: Diagnosis present

## 2023-03-05 DIAGNOSIS — Z818 Family history of other mental and behavioral disorders: Secondary | ICD-10-CM | POA: Diagnosis not present

## 2023-03-05 DIAGNOSIS — Z6839 Body mass index (BMI) 39.0-39.9, adult: Secondary | ICD-10-CM

## 2023-03-05 DIAGNOSIS — Z808 Family history of malignant neoplasm of other organs or systems: Secondary | ICD-10-CM

## 2023-03-05 DIAGNOSIS — Z8261 Family history of arthritis: Secondary | ICD-10-CM

## 2023-03-05 DIAGNOSIS — Z96643 Presence of artificial hip joint, bilateral: Secondary | ICD-10-CM | POA: Diagnosis present

## 2023-03-05 DIAGNOSIS — E119 Type 2 diabetes mellitus without complications: Secondary | ICD-10-CM | POA: Diagnosis not present

## 2023-03-05 DIAGNOSIS — Z87891 Personal history of nicotine dependence: Secondary | ICD-10-CM | POA: Diagnosis not present

## 2023-03-05 DIAGNOSIS — I10 Essential (primary) hypertension: Secondary | ICD-10-CM

## 2023-03-05 DIAGNOSIS — Z79899 Other long term (current) drug therapy: Secondary | ICD-10-CM | POA: Diagnosis not present

## 2023-03-05 DIAGNOSIS — Z01818 Encounter for other preprocedural examination: Secondary | ICD-10-CM

## 2023-03-05 LAB — TYPE AND SCREEN
ABO/RH(D): A POS
Antibody Screen: NEGATIVE

## 2023-03-05 LAB — CBC
HCT: 36.7 % (ref 36.0–46.0)
Hemoglobin: 11.6 g/dL — ABNORMAL LOW (ref 12.0–15.0)
MCH: 30.4 pg (ref 26.0–34.0)
MCHC: 31.6 g/dL (ref 30.0–36.0)
MCV: 96.1 fL (ref 80.0–100.0)
Platelets: 298 10*3/uL (ref 150–400)
RBC: 3.82 MIL/uL — ABNORMAL LOW (ref 3.87–5.11)
RDW: 14 % (ref 11.5–15.5)
WBC: 15.2 10*3/uL — ABNORMAL HIGH (ref 4.0–10.5)
nRBC: 0 % (ref 0.0–0.2)

## 2023-03-05 LAB — CREATININE, SERUM
Creatinine, Ser: 1.12 mg/dL — ABNORMAL HIGH (ref 0.44–1.00)
GFR, Estimated: 59 mL/min — ABNORMAL LOW (ref >60)

## 2023-03-05 LAB — HEMOGLOBIN AND HEMATOCRIT, BLOOD
HCT: 37.7 % (ref 36.0–46.0)
Hemoglobin: 12 g/dL (ref 12.0–15.0)

## 2023-03-05 LAB — POCT PREGNANCY, URINE: Preg Test, Ur: NEGATIVE

## 2023-03-05 SURGERY — CREATION, GASTRIC BYPASS, ROUX-EN-Y, ROBOT-ASSISTED
Anesthesia: General | Site: Abdomen

## 2023-03-05 MED ORDER — PROPOFOL 10 MG/ML IV BOLUS
INTRAVENOUS | Status: AC
  Filled 2023-03-05: qty 20

## 2023-03-05 MED ORDER — LACTATED RINGERS IV SOLN: INTRAVENOUS | Status: DC

## 2023-03-05 MED ORDER — OXYCODONE HCL 5 MG/5ML PO SOLN: 5.0000 mg | Freq: Once | ORAL | Status: DC | PRN

## 2023-03-05 MED ORDER — BUPIVACAINE LIPOSOME 1.3 % IJ SUSP
INTRAMUSCULAR | Status: DC | PRN
  Administered 2023-03-05: 20 mL

## 2023-03-05 MED ORDER — OXYCODONE HCL 5 MG/5ML PO SOLN
5.0000 mg | Freq: Four times a day (QID) | ORAL | Status: DC | PRN
  Administered 2023-03-05 – 2023-03-07 (×7): 5 mg via ORAL
  Filled 2023-03-05 (×7): qty 5

## 2023-03-05 MED ORDER — BUPIVACAINE-EPINEPHRINE 0.25% -1:200000 IJ SOLN
INTRAMUSCULAR | Status: AC
  Filled 2023-03-05: qty 1

## 2023-03-05 MED ORDER — KETAMINE HCL 10 MG/ML IJ SOLN
INTRAMUSCULAR | Status: DC | PRN
  Administered 2023-03-05: 10 mg via INTRAVENOUS
  Administered 2023-03-05 (×2): 20 mg via INTRAVENOUS

## 2023-03-05 MED ORDER — HEPARIN SODIUM (PORCINE) 5000 UNIT/ML IJ SOLN
5000.0000 [IU] | INTRAMUSCULAR | Status: AC
  Administered 2023-03-05: 5000 [IU] via SUBCUTANEOUS
  Filled 2023-03-05: qty 1

## 2023-03-05 MED ORDER — EPHEDRINE SULFATE-NACL 50-0.9 MG/10ML-% IV SOSY
PREFILLED_SYRINGE | INTRAVENOUS | Status: DC | PRN
  Administered 2023-03-05: 10 mg via INTRAVENOUS

## 2023-03-05 MED ORDER — STERILE WATER FOR IRRIGATION IR SOLN
Status: DC | PRN
  Administered 2023-03-05: 1000 mL

## 2023-03-05 MED ORDER — ONDANSETRON HCL 4 MG/2ML IJ SOLN
INTRAMUSCULAR | Status: AC
  Filled 2023-03-05: qty 2

## 2023-03-05 MED ORDER — ACETAMINOPHEN 500 MG PO TABS: 1000.0000 mg | ORAL_TABLET | Freq: Once | ORAL | Status: DC

## 2023-03-05 MED ORDER — ACETAMINOPHEN 500 MG PO TABS
1000.0000 mg | ORAL_TABLET | Freq: Three times a day (TID) | ORAL | Status: DC
  Administered 2023-03-05 – 2023-03-07 (×4): 1000 mg via ORAL
  Filled 2023-03-05 (×3): qty 2

## 2023-03-05 MED ORDER — CHLORHEXIDINE GLUCONATE CLOTH 2 % EX PADS: 6.0000 | MEDICATED_PAD | Freq: Once | CUTANEOUS | Status: DC

## 2023-03-05 MED ORDER — ACETAMINOPHEN 10 MG/ML IV SOLN: 1000.0000 mg | Freq: Once | INTRAVENOUS | Status: DC | PRN

## 2023-03-05 MED ORDER — SUGAMMADEX SODIUM 200 MG/2ML IV SOLN
INTRAVENOUS | Status: DC | PRN
  Administered 2023-03-05: 400 mg via INTRAVENOUS

## 2023-03-05 MED ORDER — ROCURONIUM BROMIDE 10 MG/ML (PF) SYRINGE
PREFILLED_SYRINGE | INTRAVENOUS | Status: AC
  Filled 2023-03-05: qty 10

## 2023-03-05 MED ORDER — LIDOCAINE HCL 2 % IJ SOLN
INTRAMUSCULAR | Status: AC
  Filled 2023-03-05: qty 20

## 2023-03-05 MED ORDER — BUPIVACAINE LIPOSOME 1.3 % IJ SUSP: 20.0000 mL | Freq: Once | INTRAMUSCULAR | Status: DC

## 2023-03-05 MED ORDER — OXYCODONE HCL 5 MG PO TABS: 5.0000 mg | ORAL_TABLET | Freq: Once | ORAL | Status: DC | PRN

## 2023-03-05 MED ORDER — ORAL CARE MOUTH RINSE: 15.0000 mL | Freq: Once | OROMUCOSAL | Status: AC

## 2023-03-05 MED ORDER — 0.9 % SODIUM CHLORIDE (POUR BTL) OPTIME
TOPICAL | Status: DC | PRN
  Administered 2023-03-05: 1000 mL

## 2023-03-05 MED ORDER — PANTOPRAZOLE SODIUM 40 MG IV SOLR
40.0000 mg | Freq: Every day | INTRAVENOUS | Status: DC
  Administered 2023-03-05 – 2023-03-06 (×2): 40 mg via INTRAVENOUS
  Filled 2023-03-05 (×3): qty 10

## 2023-03-05 MED ORDER — HEPARIN SODIUM (PORCINE) 5000 UNIT/ML IJ SOLN
5000.0000 [IU] | Freq: Three times a day (TID) | INTRAMUSCULAR | Status: DC
  Administered 2023-03-05 – 2023-03-07 (×5): 5000 [IU] via SUBCUTANEOUS
  Filled 2023-03-05 (×5): qty 1

## 2023-03-05 MED ORDER — BUPIVACAINE LIPOSOME 1.3 % IJ SUSP
INTRAMUSCULAR | Status: AC
  Filled 2023-03-05: qty 20

## 2023-03-05 MED ORDER — PROPOFOL 10 MG/ML IV BOLUS
INTRAVENOUS | Status: DC | PRN
  Administered 2023-03-05: 50 ug/kg/min via INTRAVENOUS
  Administered 2023-03-05: 200 mg via INTRAVENOUS

## 2023-03-05 MED ORDER — BUPIVACAINE-EPINEPHRINE 0.25% -1:200000 IJ SOLN
INTRAMUSCULAR | Status: DC | PRN
  Administered 2023-03-05: 30 mL

## 2023-03-05 MED ORDER — DEXAMETHASONE SODIUM PHOSPHATE 10 MG/ML IJ SOLN
INTRAMUSCULAR | Status: DC | PRN
  Administered 2023-03-05: 10 mg via INTRAVENOUS

## 2023-03-05 MED ORDER — AMLODIPINE BESYLATE 5 MG PO TABS
2.5000 mg | ORAL_TABLET | Freq: Every day | ORAL | Status: DC
  Administered 2023-03-06 – 2023-03-07 (×2): 2.5 mg via ORAL
  Filled 2023-03-05 (×2): qty 1

## 2023-03-05 MED ORDER — ACETAMINOPHEN 500 MG PO TABS
1000.0000 mg | ORAL_TABLET | ORAL | Status: AC
  Administered 2023-03-05: 1000 mg via ORAL
  Filled 2023-03-05: qty 2

## 2023-03-05 MED ORDER — PROPOFOL 1000 MG/100ML IV EMUL
INTRAVENOUS | Status: AC
  Filled 2023-03-05: qty 100

## 2023-03-05 MED ORDER — ONDANSETRON HCL 4 MG/2ML IJ SOLN
4.0000 mg | INTRAMUSCULAR | Status: DC | PRN
  Administered 2023-03-05 – 2023-03-07 (×8): 4 mg via INTRAVENOUS
  Filled 2023-03-05 (×7): qty 2

## 2023-03-05 MED ORDER — ACETAMINOPHEN 160 MG/5ML PO SOLN
1000.0000 mg | Freq: Three times a day (TID) | ORAL | Status: DC
  Administered 2023-03-05 – 2023-03-06 (×2): 1000 mg via ORAL
  Filled 2023-03-05 (×3): qty 40.6

## 2023-03-05 MED ORDER — BUPROPION HCL 75 MG PO TABS
75.0000 mg | ORAL_TABLET | Freq: Two times a day (BID) | ORAL | Status: DC
  Administered 2023-03-06 – 2023-03-07 (×3): 75 mg via ORAL
  Filled 2023-03-05 (×3): qty 1

## 2023-03-05 MED ORDER — APREPITANT 40 MG PO CAPS
40.0000 mg | ORAL_CAPSULE | ORAL | Status: AC
  Administered 2023-03-05: 40 mg via ORAL
  Filled 2023-03-05: qty 1

## 2023-03-05 MED ORDER — NORETHINDRONE ACETATE 5 MG PO TABS
5.0000 mg | ORAL_TABLET | Freq: Every day | ORAL | Status: DC
  Administered 2023-03-05 – 2023-03-07 (×3): 5 mg via ORAL
  Filled 2023-03-05 (×3): qty 1

## 2023-03-05 MED ORDER — ONDANSETRON HCL 4 MG/2ML IJ SOLN
INTRAMUSCULAR | Status: DC | PRN
  Administered 2023-03-05: 4 mg via INTRAVENOUS

## 2023-03-05 MED ORDER — FENTANYL CITRATE (PF) 250 MCG/5ML IJ SOLN
INTRAMUSCULAR | Status: DC | PRN
  Administered 2023-03-05: 50 ug via INTRAVENOUS
  Administered 2023-03-05: 100 ug via INTRAVENOUS
  Administered 2023-03-05: 50 ug via INTRAVENOUS

## 2023-03-05 MED ORDER — DULOXETINE HCL 30 MG PO CPEP
30.0000 mg | ORAL_CAPSULE | Freq: Two times a day (BID) | ORAL | Status: DC
  Administered 2023-03-06 – 2023-03-07 (×3): 30 mg via ORAL
  Filled 2023-03-05 (×4): qty 1

## 2023-03-05 MED ORDER — SIMETHICONE 80 MG PO CHEW
80.0000 mg | CHEWABLE_TABLET | Freq: Four times a day (QID) | ORAL | Status: DC | PRN
  Administered 2023-03-05 – 2023-03-07 (×5): 80 mg via ORAL
  Filled 2023-03-05 (×5): qty 1

## 2023-03-05 MED ORDER — LIDOCAINE HCL (PF) 2 % IJ SOLN
INTRAMUSCULAR | Status: AC
  Filled 2023-03-05: qty 5

## 2023-03-05 MED ORDER — SODIUM CHLORIDE 0.9 % IV SOLN
2.0000 g | INTRAVENOUS | Status: AC
  Administered 2023-03-05: 2 g via INTRAVENOUS
  Filled 2023-03-05: qty 2

## 2023-03-05 MED ORDER — MIDAZOLAM HCL 5 MG/5ML IJ SOLN
INTRAMUSCULAR | Status: DC | PRN
  Administered 2023-03-05: 2 mg via INTRAVENOUS

## 2023-03-05 MED ORDER — VILAZODONE HCL 20 MG PO TABS
40.0000 mg | ORAL_TABLET | Freq: Every day | ORAL | Status: DC
  Administered 2023-03-06 – 2023-03-07 (×2): 40 mg via ORAL
  Filled 2023-03-05 (×2): qty 2

## 2023-03-05 MED ORDER — GABAPENTIN 300 MG PO CAPS
300.0000 mg | ORAL_CAPSULE | ORAL | Status: AC
  Administered 2023-03-05: 300 mg via ORAL
  Filled 2023-03-05: qty 1

## 2023-03-05 MED ORDER — LIDOCAINE 2% (20 MG/ML) 5 ML SYRINGE
INTRAMUSCULAR | Status: DC | PRN
  Administered 2023-03-05: 1 mg/kg/h via INTRAVENOUS

## 2023-03-05 MED ORDER — FENTANYL CITRATE (PF) 250 MCG/5ML IJ SOLN
INTRAMUSCULAR | Status: AC
  Filled 2023-03-05: qty 5

## 2023-03-05 MED ORDER — EPHEDRINE 5 MG/ML INJ
INTRAVENOUS | Status: AC
  Filled 2023-03-05: qty 5

## 2023-03-05 MED ORDER — MORPHINE SULFATE (PF) 2 MG/ML IV SOLN
1.0000 mg | INTRAVENOUS | Status: DC | PRN
  Administered 2023-03-05 – 2023-03-06 (×4): 2 mg via INTRAVENOUS
  Filled 2023-03-05 (×4): qty 1

## 2023-03-05 MED ORDER — ENSURE MAX PROTEIN PO LIQD
2.0000 [oz_av] | ORAL | Status: DC
  Administered 2023-03-06 – 2023-03-07 (×7): 2 [oz_av] via ORAL

## 2023-03-05 MED ORDER — DEXAMETHASONE SODIUM PHOSPHATE 10 MG/ML IJ SOLN
INTRAMUSCULAR | Status: AC
  Filled 2023-03-05: qty 1

## 2023-03-05 MED ORDER — KETAMINE HCL 50 MG/5ML IJ SOSY
PREFILLED_SYRINGE | INTRAMUSCULAR | Status: AC
  Filled 2023-03-05: qty 5

## 2023-03-05 MED ORDER — FENTANYL CITRATE PF 50 MCG/ML IJ SOSY: 25.0000 ug | PREFILLED_SYRINGE | INTRAMUSCULAR | Status: DC | PRN

## 2023-03-05 MED ORDER — ROCURONIUM BROMIDE 10 MG/ML (PF) SYRINGE
PREFILLED_SYRINGE | INTRAVENOUS | Status: DC | PRN
  Administered 2023-03-05: 100 mg via INTRAVENOUS
  Administered 2023-03-05: 20 mg via INTRAVENOUS

## 2023-03-05 MED ORDER — SCOPOLAMINE 1 MG/3DAYS TD PT72
1.0000 | MEDICATED_PATCH | TRANSDERMAL | Status: DC
  Administered 2023-03-05: 1.5 mg via TRANSDERMAL
  Filled 2023-03-05: qty 1

## 2023-03-05 MED ORDER — MIDAZOLAM HCL 2 MG/2ML IJ SOLN
INTRAMUSCULAR | Status: AC
  Filled 2023-03-05: qty 2

## 2023-03-05 MED ORDER — PHENYLEPHRINE HCL-NACL 20-0.9 MG/250ML-% IV SOLN
INTRAVENOUS | Status: DC | PRN
  Administered 2023-03-05: 35 ug/min via INTRAVENOUS

## 2023-03-05 MED ORDER — AMISULPRIDE (ANTIEMETIC) 5 MG/2ML IV SOLN: 10.0000 mg | Freq: Once | INTRAVENOUS | Status: DC | PRN

## 2023-03-05 MED ORDER — CHLORHEXIDINE GLUCONATE 0.12 % MT SOLN
15.0000 mL | Freq: Once | OROMUCOSAL | Status: AC
  Administered 2023-03-05: 15 mL via OROMUCOSAL

## 2023-03-05 MED ORDER — BUPROPION HCL ER (XL) 150 MG PO TB24: 150.0000 mg | ORAL_TABLET | Freq: Every morning | ORAL | Status: DC

## 2023-03-05 SURGICAL SUPPLY — 76 items
ADH SKN CLS APL DERMABOND .7 (GAUZE/BANDAGES/DRESSINGS) ×1
ANTIFOG SOL W/FOAM PAD STRL (MISCELLANEOUS) ×1
APL PRP STRL LF DISP 70% ISPRP (MISCELLANEOUS) ×1
APPLIER CLIP 5 13 M/L LIGAMAX5 (MISCELLANEOUS)
APPLIER CLIP ROT 10 11.4 M/L (STAPLE)
APR CLP MED LRG 11.4X10 (STAPLE)
APR CLP MED LRG 5 ANG JAW (MISCELLANEOUS)
BLADE SURG SZ11 CARB STEEL (BLADE) ×2 IMPLANT
CANNULA REDUCER 12-8 DVNC XI (CANNULA) ×2 IMPLANT
CHLORAPREP W/TINT 26 (MISCELLANEOUS) ×4 IMPLANT
CLIP APPLIE 5 13 M/L LIGAMAX5 (MISCELLANEOUS) IMPLANT
CLIP APPLIE ROT 10 11.4 M/L (STAPLE) IMPLANT
COVER SURGICAL LIGHT HANDLE (MISCELLANEOUS) ×2 IMPLANT
COVER TIP SHEARS 8 DVNC (MISCELLANEOUS) ×2 IMPLANT
DERMABOND ADVANCED .7 DNX12 (GAUZE/BANDAGES/DRESSINGS) ×2 IMPLANT
DRAPE ARM DVNC X/XI (DISPOSABLE) ×8 IMPLANT
DRAPE COLUMN DVNC XI (DISPOSABLE) ×2 IMPLANT
DRIVER NDL LRG 8 DVNC XI (INSTRUMENTS) ×2 IMPLANT
DRIVER NDL MEGA SUTCUT DVNCXI (INSTRUMENTS) ×2 IMPLANT
DRIVER NDLE LRG 8 DVNC XI (INSTRUMENTS) IMPLANT
DRIVER NDLE MEGA SUTCUT DVNCXI (INSTRUMENTS) ×1 IMPLANT
ELECT REM PT RETURN 15FT ADLT (MISCELLANEOUS) ×2 IMPLANT
FORCEPS CADIERE DVNC XI (FORCEP) ×2 IMPLANT
GAUZE 4X4 16PLY ~~LOC~~+RFID DBL (SPONGE) ×2 IMPLANT
GLOVE BIO SURGEON STRL SZ7.5 (GLOVE) ×4 IMPLANT
GLOVE INDICATOR 8.0 STRL GRN (GLOVE) ×4 IMPLANT
GOWN STRL REUS W/ TWL XL LVL3 (GOWN DISPOSABLE) ×6 IMPLANT
GOWN STRL REUS W/TWL XL LVL3 (GOWN DISPOSABLE) ×3
GRASPER SUT TROCAR 14GX15 (MISCELLANEOUS) ×2 IMPLANT
GRASPER TIP-UP FEN DVNC XI (INSTRUMENTS) ×2 IMPLANT
IRRIG SUCT STRYKERFLOW 2 WTIP (MISCELLANEOUS)
IRRIGATION SUCT STRKRFLW 2 WTP (MISCELLANEOUS) IMPLANT
IRRIGATOR SUCT 8 DISP DVNC XI (IRRIGATION / IRRIGATOR) IMPLANT
KIT BASIN OR (CUSTOM PROCEDURE TRAY) ×2 IMPLANT
KIT GASTRIC LAVAGE 34FR ADT (SET/KITS/TRAYS/PACK) ×2 IMPLANT
KIT TURNOVER KIT A (KITS) IMPLANT
LUBRICANT JELLY K Y 4OZ (MISCELLANEOUS) IMPLANT
MARKER SKIN DUAL TIP RULER LAB (MISCELLANEOUS) ×2 IMPLANT
MAT PREVALON FULL STRYKER (MISCELLANEOUS) ×2 IMPLANT
NDL SPNL 18GX3.5 QUINCKE PK (NEEDLE) ×2 IMPLANT
NEEDLE SPNL 18GX3.5 QUINCKE PK (NEEDLE) ×1 IMPLANT
OBTURATOR OPTICAL STND 8 DVNC (TROCAR) ×1
OBTURATOR OPTICALSTD 8 DVNC (TROCAR) ×2 IMPLANT
PACK CARDIOVASCULAR III (CUSTOM PROCEDURE TRAY) ×2 IMPLANT
RELOAD STAPLE 60 2.5 WHT DVNC (STAPLE) ×6 IMPLANT
RELOAD STAPLE 60 3.5 BLU DVNC (STAPLE) IMPLANT
RELOAD STAPLER 2.5X60 WHT DVNC (STAPLE) ×8 IMPLANT
RELOAD STAPLER 3.5X60 BLU DVNC (STAPLE) IMPLANT
SCISSORS MNPLR CVD DVNC XI (INSTRUMENTS) ×2 IMPLANT
SEAL UNIV 5-12 XI (MISCELLANEOUS) ×8 IMPLANT
SEALER VESSEL EXT DVNC XI (MISCELLANEOUS) ×2 IMPLANT
SET TUBE SMOKE EVAC HIGH FLOW (TUBING) ×2 IMPLANT
SOL ELECTROSURG ANTI STICK (MISCELLANEOUS) ×1
SOLUTION ANTFG W/FOAM PAD STRL (MISCELLANEOUS) ×2 IMPLANT
SOLUTION ELECTROSURG ANTI STCK (MISCELLANEOUS) ×2 IMPLANT
SPIKE FLUID TRANSFER (MISCELLANEOUS) ×2 IMPLANT
STAPLER 60 SUREFORM DVNC (STAPLE) ×2 IMPLANT
STAPLER RELOAD 2.5X60 WHT DVNC (STAPLE) ×8
STAPLER RELOAD 3.5X60 BLU DVNC (STAPLE)
SUT MNCRL AB 4-0 PS2 18 (SUTURE) ×2 IMPLANT
SUT SILK 0 SH 30 (SUTURE) IMPLANT
SUT SILK 2 0 SH (SUTURE) ×4 IMPLANT
SUT V-LOC BARB 180 2/0GR6 GS22 (SUTURE) ×3
SUT VIC AB 2-0 SH 27 (SUTURE) ×1
SUT VIC AB 2-0 SH 27XBRD (SUTURE) IMPLANT
SUT VICRYL 0 TIES 12 18 (SUTURE) ×2 IMPLANT
SUT VLOC BARB 180 ABS3/0GR12 (SUTURE) ×2
SUTURE V-LC BRB 180 2/0GR6GS22 (SUTURE) ×4 IMPLANT
SUTURE VLOC BRB 180 ABS3/0GR12 (SUTURE) ×4 IMPLANT
SYR 20ML LL LF (SYRINGE) ×2 IMPLANT
TOWEL OR 17X26 10 PK STRL BLUE (TOWEL DISPOSABLE) ×2 IMPLANT
TRAY FOLEY MTR SLVR 14FR STAT (SET/KITS/TRAYS/PACK) IMPLANT
TRAY FOLEY MTR SLVR 16FR STAT (SET/KITS/TRAYS/PACK) IMPLANT
TROCAR ADV FIXATION 12X100MM (TROCAR) IMPLANT
TROCAR Z-THREAD FIOS 5X100MM (TROCAR) ×2 IMPLANT
TUBE CALIBRATION LAPBAND (TUBING) IMPLANT

## 2023-03-05 NOTE — Op Note (Signed)
Patient: Emma Wagner (30-Sep-1970, [id number])  Date of Surgery: 03/05/2023   Preoperative Diagnosis: MORBID OBESITY, GERD, Hypertension, Obstructive sleep apnea, diabetes  Postoperative Diagnosis: MORBID OBESITY, GERD, Hypertension, Obstructive sleep apnea, diabetes  Surgical Procedure: XI ROBOT ASSISTED GASTRIC BYPASS ROUX-EN-Y WITH UPPER ENDOSCOPY: 25956 (CPT)   Operative Team Members:  Surgeons and Role:    * Darrin Apodaca, Hyman Hopes, MD - Primary    * Gaynelle Adu, MD - Assisting   Anesthesiologist: Heather Roberts, MD CRNA: Elyn Peers, CRNA; Orest Dikes, CRNA   Anesthesia: General   Fluids:  Total I/O In: 1700 [I.V.:1600; IV Piggyback:100] Out: 175 [Urine:150; Blood:25]  Complications: None  Drains:  none   Specimen:  ID Type Source Tests Collected by Time Destination  A : Urine Urine PATH Cytology Urine URINE CULTURE Jodean Valade, Hyman Hopes, MD 03/05/2023 1011      Disposition:  PACU - hemodynamically stable.  Plan of Care: Admit to inpatient     Indications for Procedure: KLEE KOLEK is an 52 y.o. female with obesity (BMI of 39), GERD, Hypertension, obstructive sleep apnea and diabetes who is here for robotic gastric bypass with upper endoscopy.  She completed the preoperative pathway.  We discussed the procedure itself, its risks, benefits and alternatives on multiple occasions.  After a full discussion and all questions answered the patient granted consent to proceed.  We will proceed as scheduled.    Findings: Normal anatomy  Infection status: Patient: Private Patient Elective Case Case: Elective Infection Present At Time Of Surgery (PATOS): Some spillage of foregut  and jejunal contents while creating anastomoses   Description of Procedure:   On the date stated above, the patient was taken to the operating room suite and placed in supine positioning.  General endotracheal anesthesia was induced.  A timeout was completed verifying the correct  patient, procedure, positioning and equipment needed for the case.  The patient's abdomen was prepped and draped in the usual sterile fashion.  A 5 mm trocar was used to enter the right upper quadrant using optical technique.  The abdomen was entered safely without any trauma the underlying viscera.  Three additional incisions were made and 4 robotic trochars were placed across the abdomen, replacing the 5 mm trocar with the 12 mm robotic stapler trocar. The [facility] liver retractor was placed through the subxiphoid region and under the left lobe of the liver and was connected to the rail of the bed.  A TAP block was placed using marcaine and Exparel under direct vision of the laparoscope.  The [employer] XI robotic platform was docked and we transitioned to robotic surgery.  Using the tip up grasper, fenestrated bipolar, 30 degree camera and Vessel Sealer from the patient's right to left, we began by dissecting the angle of His off the left crus of the diaphragm.  The adhesions between the stomach, spleen and diaphragm were divided using the Vessel Sealer to define the angle of His.  I then started 4-6 cm down on the lesser curve of the stomach and created a defect in the gastrohepatic ligament tracking behind the lesser curve of the stomach to enter the lesser sac.  I then used multiple white loads of the robotic 60 mm Sureform linear stapler to form the gastric pouch.  I then directed my attention to the lower abdomen.  The omentum was divided with the Vessel Sealer and I identified the ligament of treitz.  The jejunum was run to a point 50 cm from  the ligament of Treitz.  This loop of bowel was then brought into the left upper quadrant, over the transverse colon, between the split omentum.  A 2-0 v-loc suture was used to create the posterior outer row of the gastrojejunal anastomosis.  An approximately 2 cm gastrotomy was made in the pouch and a matching 2 cm enterotomy was created in the roux limb.  Then, two  3-0 v-loc sutures were used to create a posterior, inner, full thickness layer of the anastomosis.  While sewing the anterior inner layer, the Ewald tube was passed through the anastomosis to ensure patency.  The outer, anterior layer was then created using 2-0 v-loc suture.  A window was made in the jejunal mesentery and the jejunum was divided just proximal to the gastrojejunal anastomosis using a white load of the robotic 60 mm Sureform linear stapler to divide the roux limb from the hepatobiliary limb.  At this point the gastrojejunal anastomosis was complete and the Ewald tube was removed.  The roux limb was then run 100 cm from the gastrojejunal anastomosis.  This loop of bowel was brought into the left upper quadrant for anastomosis to the hepatobiliary limb.  A robotic 60 mm white load on the Sureform linear stapler was introduced into both limbs and fired to create the jejunojejunostomy.  A second 60 mm white load of the Sureform linear stapler was fired to connect the distal Roux limb to the proximal common channel creating a W shaped anastomosis.  The common enterotomy was closed with a final 60 mm white load of the Sureform linear stapler.  The jejunojejunostomy mesenteric defect was closed using running 2-0 silk suture.   The retro-roux defect was closed, closing the roux limb mesentery to the transverse mesocolon mesentery using a running 2-0 silk suture.   I ran the roux limb from the gastrojejunal anastomosis to the jejunojejunostomy and found the anatomy as expected without any twisting of the mesentery.  The intra-abdominal pressure was decreased to for the remainder of the case to check for hemostasis.  I then transitioned to endoscopic portion of the procedure.  The adult upper endoscope was inserted into the pouch.  The pouch appeared appropriately sized.  There was good intra-luminal hemostasis.  The endoscope was inserted through the anastomosis into the roux limb.  The anastomosis  appeared well formed without any stricture.  The anastomosis was submerged in irrigation in the left upper quadrant and there was no leakage of air bubbles with endoscopic insufflation suggesting a negative leak test and an air tight anastomosis.    The foregut was decompressed with the endoscope and the endoscope was removed.  The robotic instruments were removed and the robot was undocked.  The stapler port in the right abdomen was closed at the fascial level using 0-vicryl on a PMI suture passer.  The liver retractor was removed under direct vision.  The pneumoperitoneum was evacuated.  The skin was closed using 4-0 Monocryl and Dermabond.  All sponge and needle counts were correct at the end of the case.    Ivar Drape, MD General, Bariatric, & Minimally Invasive Surgery [facility], [place]

## 2023-03-05 NOTE — Discharge Instructions (Signed)

## 2023-03-05 NOTE — H&P (Signed)
Admitting Physician: Hyman Hopes Truitt Cruey  Service: Bariatric surgery  CC: Obesity  Subjective   HPI: Emma Wagner is an 52 y.o. female who is here for gastric bypass  Past Medical History:  Diagnosis Date   Anemia    Anxiety    add also   Arthritis    Blood transfusion without reported diagnosis    Borderline type 2 diabetes mellitus since 2013   -lost weight and quit smoking and is no longer PRE diabetic   Bursitis    bilateral hips, more on right hip   Constipation    Depression    Frequent urination    while kidney stone evident   GERD (gastroesophageal reflux disease)    Headache    "every blue moon. not often"   Hemorrhoids    History of kidney stones    Hypertension    Obesity    Pneumonia    PONV (postoperative nausea and vomiting)    nausea with appendectomy and with hip replacement in March 2017   Pre-diabetes    Sleep apnea    uses CPAP    Past Surgical History:  Procedure Laterality Date   APPENDECTOMY  08/21/1983   CESAREAN SECTION  08/21/2007   DENTAL SURGERY  08/2022   2 teeth in the back on your right side. One top one bottom   TOTAL HIP ARTHROPLASTY Left 10/31/2015   Procedure: TOTAL HIP ARTHROPLASTY ANTERIOR APPROACH;  Surgeon: Gean Birchwood, MD;  Location: [facility] OR;  Service: Orthopedics;  Laterality: Left;   TOTAL HIP ARTHROPLASTY Right 06/13/2016   Procedure: TOTAL HIP ARTHROPLASTY ANTERIOR APPROACH;  Surgeon: Gean Birchwood, MD;  Location: [facility] OR;  Service: Orthopedics;  Laterality: Right;   TUBAL LIGATION  08/21/2007   WISDOM TOOTH EXTRACTION      Family History  Problem Relation Age of Onset   Arthritis Mother    Hypertension Mother    Sleep apnea Mother    Prostate cancer Father    Skin cancer Father    Congestive Heart Failure Father    Hypertension Father    Snoring Father    Heart failure Other    Hypertension Other    Cancer Other    Anxiety disorder Other    Depression Other    Insomnia Other    Stomach cancer Neg Hx     Colon cancer Neg Hx    Esophageal cancer Neg Hx     Social:  reports that she has quit smoking. Her smoking use included cigarettes. She started smoking about 10 months ago. She has a 0.4 pack-year smoking history. She has never used smokeless tobacco. She reports current alcohol use. She reports that she does not use drugs.  Allergies: No Known Allergies  Medications: Current Outpatient Medications  Medication Instructions   amLODipine (NORVASC) 2.5 MG tablet TAKE 1 TABLET BY MOUTH EVERY DAY   buPROPion (WELLBUTRIN XL) 150 mg, Oral, Every morning   diphenhydrAMINE HCl, Sleep, (ZZZQUIL) 50 MG/30ML LIQD 30 mLs, Oral, At bedtime PRN   diphenhydramine-acetaminophen (TYLENOL PM) 25-500 MG TABS tablet 2 tablets, Oral, At bedtime PRN   DULoxetine (CYMBALTA) 30 mg, Oral, 2 times daily   famotidine (PEPCID) 40 MG tablet TAKE 1 TABLET BY MOUTH EVERYDAY AT BEDTIME   Ibuprofen 800 mg, Oral, Every 6 hours PRN   Iron-FA-B Cmp-C-Biot-Probiotic (FUSION PLUS) CAPS 1 capsule, Oral, Daily   methocarbamol (ROBAXIN) 500 mg, Oral, Every 8 hours PRN   norethindrone (AYGESTIN) 5 mg, Oral, Daily, To stop periods x  1 month in order to have gastric surgery   nystatin cream (MYCOSTATIN) 1 Application, Topical, Daily PRN   ondansetron (ZOFRAN) 4 mg, Oral, Every 8 hours PRN   pantoprazole (PROTONIX) 40 MG tablet TAKE 1 TABLET 20 TO 30 MINUTES BEFORE BREAKFAST AND 1 TABLET 20 TO 30 MINUTES BEFORE DINNER MEAL.   tamsulosin (FLOMAX) 0.4 MG CAPS capsule TAKE 1 CAPSULE BY MOUTH EVERY DAY   Ubrogepant (UBRELVY) 100 MG TABS TAKE 1 TABLET BY MOUTH AS NEEDED MAY REPEAT IN 2 HOURS IF NEEDED MAX 2 TABLETS IN 24 HOURS   varenicline (CHANTIX) 1 mg, Oral, 2 times daily   Varenicline Tartrate, Starter, 0.5 MG X 11 & 1 MG X 42 TBPK TAKE 1 0.5 MG TAB BY MOUTH DAILY X3 DAYS, THEN INCREASE TO 1 0.5 MG TAB TWICE A DAY X4 DAYS, THEN INCREASE TO ONE 1 MG TABLET TWICE A DAY   Vilazodone HCl (VIIBRYD) 40 mg, Oral, Daily   Vitamin D  (Ergocalciferol) (DRISDOL) 50,000 Units, Oral, Every 7 days   Zembrace SymTouch 3 mg, Subcutaneous, As needed    ROS - all of the below systems have been reviewed with the patient and positives are indicated with bold text General: chills, fever or night sweats Eyes: blurry vision or double vision ENT: epistaxis or sore throat Allergy/Immunology: itchy/watery eyes or nasal congestion Hematologic/Lymphatic: bleeding problems, blood clots or swollen lymph nodes Endocrine: temperature intolerance or unexpected weight changes Breast: new or changing breast lumps or nipple discharge Resp: cough, shortness of breath, or wheezing CV: chest pain or dyspnea on exertion GI: as per HPI GU: dysuria, trouble voiding, or hematuria MSK: joint pain or joint stiffness Neuro: TIA or stroke symptoms Derm: pruritus and skin lesion changes Psych: anxiety and depression  Objective   PE Blood pressure (!) 150/91, pulse 85, temperature 98.5 F (36.9 C), temperature source Oral, resp. rate 16, last menstrual period 02/10/2023, SpO2 98%. Constitutional: NAD; conversant; no deformities Eyes: Moist conjunctiva; no lid lag; anicteric; PERRL Neck: Trachea midline; no thyromegaly Lungs: Normal respiratory effort; no tactile fremitus CV: RRR; no palpable thrills; no pitting edema GI: Abd soft, nontender; no palpable hepatosplenomegaly MSK: Normal range of motion of extremities; no clubbing/cyanosis Psychiatric: Appropriate affect; alert and oriented x3 Lymphatic: No palpable cervical or axillary lymphadenopathy  Results for orders placed or performed during the hospital encounter of 03/05/23 (from the past 24 hour(s))  Pregnancy, urine POC     Status: None   Collection Time: 03/05/23  5:49 AM  Result Value Ref Range   Preg Test, Ur NEGATIVE NEGATIVE    Imaging Orders  No imaging studies ordered today     Assessment and Plan   [name] is an 52 y.o. female with obesity (BMI of 39), GERD,  Hypertension, obstructive sleep apnea and diabetes who is here for robotic gastric bypass with upper endoscopy.  She completed the preoperative pathway.  We discussed the procedure itself, its risks, benefits and alternatives on multiple occasions.  After a full discussion and all questions answered the patient granted consent to proceed.  We will proceed as scheduled.    Quentin Ore, MD  [facility] Use [email or web address] to contact on call provider

## 2023-03-05 NOTE — Progress Notes (Signed)
Patient was complaining of gasping for air whenever she fall asleep, notified RT for CPAP and pt was put on it.After an hour pt called and reports the same issue even with her CPAP on. She is requesting to call the doctor vital signs was taken and as follows : Temp. 98.3 BP- 155/80 HR- 78 O2- 98% on room air. Dr. Derrell Lolling was paged and made aware who recommends to use Incentive inspirometer.   Patient was put on 2L of O2 for comfort.

## 2023-03-05 NOTE — Progress Notes (Signed)
PHARMACY CONSULT FOR:  Risk Assessment for Post-Discharge VTE Following Bariatric Surgery  Post-Discharge VTE Risk Assessment: This patient's probability of 30-day post-discharge VTE is increased due to the factors marked:  Sleeve gastrectomy   Liver disorder (transplant, cirrhosis, or nonalcoholic steatohepatitis)   Hx of VTE   Hemorrhage requiring transfusion   GI perforation, leak, or obstruction   ====================================================    Female    Age >/=60 years    BMI >/=50 kg/m2    CHF    Dyspnea at Rest    Paraplegia   X Non-gastric-band surgery    Operation Time >/=3 hr    Return to OR     Length of Stay >/= 3 d   Hypercoagulable condition   Significant venous stasis      Predicted probability of 30-day post-discharge VTE: 0.16%   Recommendation for Discharge: No pharmacologic prophylaxis post-discharge   Emma Wagner is a 52 y.o. female who underwent XI ROBOT ASSISTED GASTRIC BYPASS ROUX-EN-Y WITH UPPER ENDOSCOPY on 03/05/23.    Case start: 0754 Case end: 1005   No Known Allergies  Patient Measurements:   There is no height or weight on file to calculate BMI.  No results for input(s): "WBC", "HGB", "HCT", "PLT", "APTT", "CREATININE", "LABCREA", "CREAT24HRUR", "MG", "PHOS", "ALBUMIN", "PROT", "AST", "ALT", "ALKPHOS", "BILITOT", "BILIDIR", "IBILI" in the last 72 hours. Estimated Creatinine Clearance: 86.5 mL/min (by C-G formula based on SCr of 0.96 mg/dL).    Past Medical History:  Diagnosis Date   Anemia    Anxiety    add also   Arthritis    Blood transfusion without reported diagnosis    Borderline type 2 diabetes mellitus since 2013   -lost weight and quit smoking and is no longer PRE diabetic   Bursitis    bilateral hips, more on right hip   Constipation    Depression    Frequent urination    while kidney stone evident   GERD (gastroesophageal reflux disease)    Headache    "every blue moon. not often"   Hemorrhoids     History of kidney stones    Hypertension    Obesity    Pneumonia    PONV (postoperative nausea and vomiting)    nausea with appendectomy and with hip replacement in March 2017   Pre-diabetes    Sleep apnea    uses CPAP     Medications Prior to Admission  Medication Sig Dispense Refill Last Dose   amLODipine (NORVASC) 2.5 MG tablet TAKE 1 TABLET BY MOUTH EVERY DAY 90 tablet 1 03/05/2023 at 0420   buPROPion (WELLBUTRIN XL) 150 MG 24 hr tablet Take 150 mg by mouth every morning.   03/05/2023 at 0420   diphenhydrAMINE HCl, Sleep, (ZZZQUIL) 50 MG/30ML LIQD Take 30 mLs by mouth at bedtime as needed (Sleep).      diphenhydramine-acetaminophen (TYLENOL PM) 25-500 MG TABS tablet Take 2 tablets by mouth at bedtime as needed (sleep).   Past Week   DULoxetine (CYMBALTA) 30 MG capsule Take 30 mg by mouth 2 (two) times daily.      famotidine (PEPCID) 40 MG tablet TAKE 1 TABLET BY MOUTH EVERYDAY AT BEDTIME 90 tablet 1 03/05/2023 at 0420   Ibuprofen 200 MG CAPS Take 800 mg by mouth every 6 (six) hours as needed for moderate pain or mild pain.   Past Week   methocarbamol (ROBAXIN) 500 MG tablet Take 1 tablet (500 mg total) by mouth every 8 (eight) hours as needed for muscle spasms.  45 tablet 0 Past Month   norethindrone (AYGESTIN) 5 MG tablet Take 5 mg by mouth daily. To stop periods x 1 month in order to have gastric surgery   03/04/2023   ondansetron (ZOFRAN) 4 MG tablet Take 1 tablet (4 mg total) by mouth every 8 (eight) hours as needed for nausea or vomiting. 30 tablet 0    pantoprazole (PROTONIX) 40 MG tablet TAKE 1 TABLET 20 TO 30 MINUTES BEFORE BREAKFAST AND 1 TABLET 20 TO 30 MINUTES BEFORE DINNER MEAL. 180 tablet 1 03/05/2023 at 0420   Ubrogepant (UBRELVY) 100 MG TABS TAKE 1 TABLET BY MOUTH AS NEEDED MAY REPEAT IN 2 HOURS IF NEEDED MAX 2 TABLETS IN 24 HOURS (Patient taking differently: daily as needed (MIgraine). TAKE 1 TABLET BY MOUTH AS NEEDED MAY REPEAT IN 2 HOURS IF NEEDED MAX 2 TABLETS IN 24 HOURS)  16 tablet 6    Vilazodone HCl (VIIBRYD) 40 MG TABS Take 40 mg by mouth daily.   03/05/2023 at 0420   Vitamin D, Ergocalciferol, (DRISDOL) 1.25 MG (50000 UNIT) CAPS capsule Take 1 capsule (50,000 Units total) by mouth every 7 (seven) days. 7 capsule 12 Past Month   ZEMBRACE SYMTOUCH 3 MG/0.5ML SOAJ Inject 3 mg into the skin as needed. (Patient taking differently: Inject 3 mg into the skin as needed (Migraine).) 0.5 mL 5    Iron-FA-B Cmp-C-Biot-Probiotic (FUSION PLUS) CAPS Take 1 capsule by mouth daily. (Patient not taking: Reported on 02/25/2023) 30 capsule  Not Taking   nystatin cream (MYCOSTATIN) Apply 1 Application topically daily as needed (Rash near lip).   More than a month   tamsulosin (FLOMAX) 0.4 MG CAPS capsule TAKE 1 CAPSULE BY MOUTH EVERY DAY (Patient not taking: Reported on 02/25/2023) 90 capsule 1 Not Taking   varenicline (CHANTIX) 1 MG tablet TAKE 1 TABLET BY MOUTH TWICE A DAY (Patient not taking: Reported on 02/25/2023) 168 tablet 1 Not Taking   Varenicline Tartrate, Starter, 0.5 MG X 11 & 1 MG X 42 TBPK TAKE 1 0.5 MG TAB BY MOUTH DAILY X3 DAYS, THEN INCREASE TO 1 0.5 MG TAB TWICE A DAY X4 DAYS, THEN INCREASE TO ONE 1 MG TABLET TWICE A DAY (Patient not taking: Reported on 02/25/2023) 53 each 0 Not Taking     Cherylin Mylar, PharmD Clinical Pharmacist  7/16/202411:41 AM

## 2023-03-05 NOTE — Anesthesia Procedure Notes (Signed)
Procedure Name: Intubation Date/Time: 03/05/2023 7:32 AM  Performed by: Orest Dikes, CRNAPre-anesthesia Checklist: Patient identified, Emergency Drugs available, Suction available and Patient being monitored Patient Re-evaluated:Patient Re-evaluated prior to induction Oxygen Delivery Method: Circle system utilized Preoxygenation: Pre-oxygenation with 100% oxygen Induction Type: IV induction Ventilation: Mask ventilation without difficulty Laryngoscope Size: Mac and 4 Grade View: Grade I Tube type: Oral Tube size: 7.0 mm Number of attempts: 1 Airway Equipment and Method: Stylet and Oral airway Placement Confirmation: ETT inserted through vocal cords under direct vision, positive ETCO2 and breath sounds checked- equal and bilateral Secured at: 21 cm Tube secured with: Tape Dental Injury: Teeth and Oropharynx as per pre-operative assessment

## 2023-03-05 NOTE — Plan of Care (Signed)
  Problem: Education: Goal: Knowledge of the prescribed self-care regimen will improve Outcome: Progressing Note: Aware of requirements   Problem: Activity: Goal: Ability to tolerate increased activity will improve Outcome: Progressing   Problem: Bowel/Gastric: Goal: Gastrointestinal status for postoperative course will improve Outcome: Progressing Note: Progressing with medication Goal: Occurrences of nausea will decrease Outcome: Progressing   Problem: Education: Goal: Knowledge of discharge needs will improve Note: Aware of requirements

## 2023-03-05 NOTE — Anesthesia Postprocedure Evaluation (Signed)
Anesthesia Post Note  Patient: Emma Wagner  Procedure(s) Performed: XI ROBOT ASSISTED GASTRIC BYPASS ROUX-EN-Y WITH UPPER ENDOSCOPY (Abdomen)     Patient location during evaluation: PACU Anesthesia Type: General Level of consciousness: sedated Pain management: pain level controlled Vital Signs Assessment: post-procedure vital signs reviewed and stable Respiratory status: spontaneous breathing and respiratory function stable Cardiovascular status: stable Postop Assessment: no apparent nausea or vomiting Anesthetic complications: no   No notable events documented.  Last Vitals:  Vitals:   03/05/23 1045 03/05/23 1100  BP: 105/71 91/65  Pulse: (!) 57 72  Resp: 10 14  Temp:  36.8 C  SpO2: 100% 100%    Last Pain:  Vitals:   03/05/23 1100  TempSrc:   PainSc: 2                  Carianna Lague DANIEL

## 2023-03-05 NOTE — Op Note (Signed)
GERARDO TERRITO [id number] Mar 17, 1971 03/05/2023  Preoperative diagnosis: severe obesity  Postoperative diagnosis: Same   Procedure: Upper endoscopy   Surgeon: Atilano Ina M.D., FACS   Anesthesia: Gen.   Indications for procedure: 52 y.o. yo female undergoing a laparoscopic roux en y gastric bypass and an upper endoscopy was requested to evaluate the anastomosis.  Description of procedure: After we have completed the new gastrojejunostomy, I scrubbed out and obtained the Olympus endoscope. I gently placed endoscope in the patient's oropharynx and gently glided it down the esophagus without any difficulty under direct visualization. Once I was in the gastric pouch, I insufflated the pouch was air. Z line at 38 cm. The pouch was approximately 5 cm in size. I was able to cannulate and advanced the scope through the gastrojejunostomy. Dr.Stechschulte had placed saline in the upper abdomen. Upon further insufflation of the gastric pouch there was no evidence of bubbles. Upon further inspection of the gastric pouch, the mucosa appeared normal. There is no evidence of any mucosal abnormality. The gastric pouch and Roux limb were decompressed. The width of the gastrojejunal anastomosis was at least 2 cm. The scope was withdrawn. The patient tolerated this portion of the procedure well. Please see Dr Stechschulte's operative note for details regarding the laparoscopic roux-en-y gastric bypass.  Mary Sella. Andrey Campanile, MD, FACS General, Bariatric, & Minimally Invasive Surgery [facility], [place]

## 2023-03-05 NOTE — Transfer of Care (Signed)
Immediate Anesthesia Transfer of Care Note  Patient: Emma Wagner  Procedure(s) Performed: XI ROBOT ASSISTED GASTRIC BYPASS ROUX-EN-Y WITH UPPER ENDOSCOPY (Abdomen)  Patient Location: PACU  Anesthesia Type:General  Level of Consciousness: awake, alert , and oriented  Airway & Oxygen Therapy: Patient Spontanous Breathing and Patient connected to face mask oxygen  Post-op Assessment: Report given to RN and Post -op Vital signs reviewed and stable  Post vital signs: Reviewed and stable  Last Vitals:  Vitals Value Taken Time  BP    Temp    Pulse 83 03/05/23 1019  Resp 17 03/05/23 1019  SpO2 99 % 03/05/23 1019  Vitals shown include unfiled device data.  Last Pain:  Vitals:   03/05/23 0616  TempSrc: Oral         Complications: No notable events documented.

## 2023-03-05 NOTE — Progress Notes (Signed)
   03/05/23 1952  BiPAP/CPAP/SIPAP  $ Non-Invasive Home Ventilator  Initial  BiPAP/CPAP/SIPAP Pt Type Adult  BiPAP/CPAP/SIPAP Resmed  Mask Type Nasal mask (Pts. Nasal mask/Tubing)  Respiratory Rate 18 breaths/min  Pressure Support 4 cmH20  FiO2 (%) 21 %  Flow Rate 0 lpm  Patient Home Equipment Yes (Only Nasal Mask/Tubing)  Auto Titrate Yes (<8(+4PS)>25)  CPAP/SIPAP surface wiped down Yes  Safety Check Completed by RT for Home Unit Yes, no issues noted   Pt. Placed on CPAP auto for h/s, made aware to notify if needed.

## 2023-03-06 LAB — CBC WITH DIFFERENTIAL/PLATELET
Abs Immature Granulocytes: 0.07 10*3/uL (ref 0.00–0.07)
Basophils Absolute: 0 10*3/uL (ref 0.0–0.1)
Basophils Relative: 0 %
Eosinophils Absolute: 0 10*3/uL (ref 0.0–0.5)
Eosinophils Relative: 0 %
HCT: 37.4 % (ref 36.0–46.0)
Hemoglobin: 12.2 g/dL (ref 12.0–15.0)
Immature Granulocytes: 0 %
Lymphocytes Relative: 13 %
Lymphs Abs: 2.1 10*3/uL (ref 0.7–4.0)
MCH: 31.1 pg (ref 26.0–34.0)
MCHC: 32.6 g/dL (ref 30.0–36.0)
MCV: 95.4 fL (ref 80.0–100.0)
Monocytes Absolute: 1.1 10*3/uL — ABNORMAL HIGH (ref 0.1–1.0)
Monocytes Relative: 7 %
Neutro Abs: 12.5 10*3/uL — ABNORMAL HIGH (ref 1.7–7.7)
Neutrophils Relative %: 80 %
Platelets: 299 10*3/uL (ref 150–400)
RBC: 3.92 MIL/uL (ref 3.87–5.11)
RDW: 14 % (ref 11.5–15.5)
WBC: 15.8 10*3/uL — ABNORMAL HIGH (ref 4.0–10.5)
nRBC: 0 % (ref 0.0–0.2)

## 2023-03-06 LAB — URINE CULTURE: Culture: NO GROWTH

## 2023-03-06 NOTE — Progress Notes (Signed)
   03/06/23 0857  TOC Brief Assessment  Insurance and Status Reviewed  Patient has primary care physician Yes  Home environment has been reviewed Resides with significant other  Prior level of function: Independent at baseline  Prior/Current Home Services No current home services  Social Determinants of Health Reivew SDOH reviewed no interventions necessary  Readmission risk has been reviewed Yes  Transition of care needs no transition of care needs at this time

## 2023-03-06 NOTE — Progress Notes (Signed)
Late entry - patient seen at 1PM  Progress Note: General Surgery Service   Chief Complaint/Subjective: Rough night POD1.  Pain  Objective: Vital signs in last 24 hours: Temp:  [98.1 F (36.7 C)-98.6 F (37 C)] 98.6 F (37 C) (07/17 1335) Pulse Rate:  [65-91] 65 (07/17 1335) Resp:  [16-19] 16 (07/17 1335) BP: (145-173)/(80-108) 153/86 (07/17 1335) SpO2:  [97 %-100 %] 98 % (07/17 1335) FiO2 (%):  [21 %] 21 % (07/16 1952) Last BM Date : 03/04/23  Intake/Output from previous day: 07/16 0701 - 07/17 0700 In: 3404.7 [P.O.:300; I.V.:3004.7; IV Piggyback:100] Out: 1775 [Urine:1750; Blood:25] Intake/Output this shift: Total I/O In: -  Out: 550 [Urine:550]  GI: Abd soft, incision c/d/I w. glue  Lab Results: CBC  Recent Labs    03/05/23 1135 03/05/23 1416 03/06/23 0443  WBC 15.2*  --  15.8*  HGB 11.6* 12.0 12.2  HCT 36.7 37.7 37.4  PLT 298  --  299   BMET Recent Labs    03/05/23 1135  CREATININE 1.12*   PT/INR No results for input(s): "LABPROT", "INR" in the last 72 hours. ABG No results for input(s): "PHART", "HCO3" in the last 72 hours.  Invalid input(s): "PCO2", "PO2"  Anti-infectives: Anti-infectives (From admission, onward)    Start     Dose/Rate Route Frequency Ordered Stop   03/05/23 0600  cefoTEtan (CEFOTAN) 2 g in sodium chloride 0.9 % 100 mL IVPB        2 g 200 mL/hr over 30 Minutes Intravenous On call to O.R. 03/05/23 0535 03/05/23 0804       Medications: Scheduled Meds:  acetaminophen  1,000 mg Oral Q8H   Or   acetaminophen (TYLENOL) oral liquid 160 mg/5 mL  1,000 mg Oral Q8H   amLODipine  2.5 mg Oral Daily   buPROPion  75 mg Oral BID   DULoxetine  30 mg Oral BID   heparin injection (subcutaneous)  5,000 Units Subcutaneous Q8H   norethindrone  5 mg Oral Daily   pantoprazole (PROTONIX) IV  40 mg Intravenous QHS   Ensure Max Protein  2 oz Oral Q2H   Vilazodone HCl  40 mg Oral Daily   Continuous Infusions:  lactated ringers 75 mL/hr at  03/05/23 2242   PRN Meds:.morphine injection, ondansetron (ZOFRAN) IV, oxyCODONE, simethicone  Assessment/Plan: s/p Procedure(s): XI ROBOT ASSISTED GASTRIC BYPASS ROUX-EN-Y WITH UPPER ENDOSCOPY 03/05/2023  Continue bariatric protocol Stay tonight for pain control   LOS: 1 day    Quentin Ore, MD  [facility] Use [email or web address] to contact on call provider  Daily Billing: [id number] - post op

## 2023-03-06 NOTE — Progress Notes (Signed)
   03/06/23 2136  BiPAP/CPAP/SIPAP  BiPAP/CPAP/SIPAP Pt Type Adult (Patient prefers selfplacement when ready for bed.  Machine plugged into red outlet and ready for patient use.)  BiPAP/CPAP/SIPAP Resmed  Mask Type Nasal mask (Patient is using her nasal mask and tubing from home.)  FiO2 (%) 21 %  Patient Home Equipment No  Auto Titrate Yes (Automode= Emin-12 cm H2O, Imax- 20 cm H2O)  CPAP/SIPAP surface wiped down Yes

## 2023-03-07 LAB — CBC WITH DIFFERENTIAL/PLATELET
Abs Immature Granulocytes: 0.03 10*3/uL (ref 0.00–0.07)
Basophils Absolute: 0.1 10*3/uL (ref 0.0–0.1)
Basophils Relative: 1 %
Eosinophils Absolute: 0 10*3/uL (ref 0.0–0.5)
Eosinophils Relative: 0 %
HCT: 35.7 % — ABNORMAL LOW (ref 36.0–46.0)
Hemoglobin: 11.4 g/dL — ABNORMAL LOW (ref 12.0–15.0)
Immature Granulocytes: 0 %
Lymphocytes Relative: 26 %
Lymphs Abs: 3.8 10*3/uL (ref 0.7–4.0)
MCH: 30.5 pg (ref 26.0–34.0)
MCHC: 31.9 g/dL (ref 30.0–36.0)
MCV: 95.5 fL (ref 80.0–100.0)
Monocytes Absolute: 1.1 10*3/uL — ABNORMAL HIGH (ref 0.1–1.0)
Monocytes Relative: 8 %
Neutro Abs: 9.9 10*3/uL — ABNORMAL HIGH (ref 1.7–7.7)
Neutrophils Relative %: 65 %
Platelets: 250 10*3/uL (ref 150–400)
RBC: 3.74 MIL/uL — ABNORMAL LOW (ref 3.87–5.11)
RDW: 13.8 % (ref 11.5–15.5)
WBC: 15 10*3/uL — ABNORMAL HIGH (ref 4.0–10.5)
nRBC: 0 % (ref 0.0–0.2)

## 2023-03-07 MED ORDER — PANTOPRAZOLE SODIUM 40 MG PO TBEC: 40.0000 mg | DELAYED_RELEASE_TABLET | Freq: Every day | ORAL | 0 refills | Status: AC

## 2023-03-07 MED ORDER — ONDANSETRON 4 MG PO TBDP: 4.0000 mg | ORAL_TABLET | Freq: Four times a day (QID) | ORAL | 0 refills | Status: AC | PRN

## 2023-03-07 MED ORDER — ACETAMINOPHEN 500 MG PO TABS: 1000.0000 mg | ORAL_TABLET | Freq: Three times a day (TID) | ORAL | Status: AC

## 2023-03-07 MED ORDER — OXYCODONE HCL 5 MG PO TABS: 5.0000 mg | ORAL_TABLET | Freq: Four times a day (QID) | ORAL | 0 refills | Status: DC | PRN

## 2023-03-07 MED ORDER — METHOCARBAMOL 750 MG PO TABS: 750.0000 mg | ORAL_TABLET | Freq: Four times a day (QID) | ORAL | 0 refills | Status: DC | PRN

## 2023-03-07 MED ORDER — GABAPENTIN 100 MG PO CAPS: 200.0000 mg | ORAL_CAPSULE | Freq: Two times a day (BID) | ORAL | 0 refills | Status: DC

## 2023-03-07 NOTE — Discharge Summary (Signed)
Patient ID: Emma Wagner [id number] 52 y.o. Jan 05, 1971  03/05/2023  Discharge date and time: 03/07/2023  Admitting Physician: Hyman Hopes [place] Kirchner  Discharge Physician: Hyman Hopes Delene Morais  Admission Diagnoses: Morbid obesity (HCC) [E66.01] Patient Active Problem List   Diagnosis Date Noted   Morbid obesity (HCC) 03/05/2023   Diastasis recti 02/01/2023   Iron deficiency 02/01/2023   Renal cyst, right 05/29/2022   HTN (hypertension) 05/10/2021   Chronic constipation 03/04/2019   Migraines 11/21/2018   Postoperative anemia due to acute blood loss 06/16/2016   Primary osteoarthritis of left hip 10/31/2015   Primary osteoarthritis of right hip 10/30/2015   Pain of right upper arm 05/12/2015   Hair loss 05/02/2012   Prediabetes 02/20/2012   Thrombosed external hemorrhoid 02/08/2012   Post-nasal drip 11/27/2011   Anxiety and depression 11/27/2011   Arthritis of right hip 11/27/2011   General medical examination 10/29/2011   Cough 09/17/2011   Obesity (BMI 30-39.9) 09/17/2011   Tobacco abuse 08/03/2011   X-ray of lung, abnormal 06/11/2007   GERD 06/11/2007     Discharge Diagnoses:  Patient Active Problem List   Diagnosis Date Noted   Morbid obesity (HCC) 03/05/2023   Diastasis recti 02/01/2023   Iron deficiency 02/01/2023   Renal cyst, right 05/29/2022   HTN (hypertension) 05/10/2021   Chronic constipation 03/04/2019   Migraines 11/21/2018   Postoperative anemia due to acute blood loss 06/16/2016   Primary osteoarthritis of left hip 10/31/2015   Primary osteoarthritis of right hip 10/30/2015   Pain of right upper arm 05/12/2015   Hair loss 05/02/2012   Prediabetes 02/20/2012   Thrombosed external hemorrhoid 02/08/2012   Post-nasal drip 11/27/2011   Anxiety and depression 11/27/2011   Arthritis of right hip 11/27/2011   General medical examination 10/29/2011   Cough 09/17/2011   Obesity (BMI 30-39.9) 09/17/2011   Tobacco abuse 08/03/2011   X-ray of lung,  abnormal 06/11/2007   GERD 06/11/2007    Operations: Procedure(s): XI ROBOT ASSISTED GASTRIC BYPASS ROUX-EN-Y WITH UPPER ENDOSCOPY  Admission Condition: good  Discharged Condition: good  Indication for Admission: Obesity  Hospital Course: Robotic gastric bypass  Consults: None  Significant Diagnostic Studies: None  Treatments: surgery: as above  Disposition: Home  Patient Instructions:  Allergies as of 03/07/2023   No Known Allergies      Medication List     STOP taking these medications    Fusion Plus Caps   Ibuprofen 200 MG Caps   tamsulosin 0.4 MG Caps capsule Commonly known as: FLOMAX   varenicline 1 MG tablet Commonly known as: CHANTIX   Varenicline Tartrate (Starter) 0.5 MG X 11 & 1 MG X 42 Tbpk       TAKE these medications    acetaminophen 500 MG tablet Commonly known as: TYLENOL Take 2 tablets (1,000 mg total) by mouth every 8 (eight) hours for 5 days.   amLODipine 2.5 MG tablet Commonly known as: NORVASC TAKE 1 TABLET BY MOUTH EVERY DAY Notes to patient: Monitor Blood Pressure Daily and keep a log for primary care physician.  Your physician may need to make changes to your medications with rapid weight loss.      buPROPion 150 MG 24 hr tablet Commonly known as: WELLBUTRIN XL Take 150 mg by mouth every morning.   diphenhydramine-acetaminophen 25-500 MG Tabs tablet Commonly known as: TYLENOL PM Take 2 tablets by mouth at bedtime as needed (sleep).   DULoxetine 30 MG capsule Commonly known as: CYMBALTA Take 30 mg by mouth 2 (  two) times daily.   famotidine 40 MG tablet Commonly known as: PEPCID TAKE 1 TABLET BY MOUTH EVERYDAY AT BEDTIME   gabapentin 100 MG capsule Commonly known as: NEURONTIN Take 2 capsules (200 mg total) by mouth every 12 (twelve) hours.   methocarbamol 750 MG tablet Commonly known as: Robaxin-750 Take 1 tablet (750 mg total) by mouth every 6 (six) hours as needed for muscle spasms. What changed:  medication  strength how much to take when to take this   norethindrone 5 MG tablet Commonly known as: AYGESTIN Take 5 mg by mouth daily. To stop periods x 1 month in order to have gastric surgery   nystatin cream Commonly known as: MYCOSTATIN Apply 1 Application topically daily as needed (Rash near lip).   ondansetron 4 MG disintegrating tablet Commonly known as: ZOFRAN-ODT Take 1 tablet (4 mg total) by mouth every 6 (six) hours as needed for nausea or vomiting.   ondansetron 4 MG tablet Commonly known as: Zofran Take 1 tablet (4 mg total) by mouth every 8 (eight) hours as needed for nausea or vomiting.   oxyCODONE 5 MG immediate release tablet Commonly known as: Oxy IR/ROXICODONE Take 1 tablet (5 mg total) by mouth every 6 (six) hours as needed for severe pain.   pantoprazole 40 MG tablet Commonly known as: PROTONIX TAKE 1 TABLET 20 TO 30 MINUTES BEFORE BREAKFAST AND 1 TABLET 20 TO 30 MINUTES BEFORE DINNER MEAL. What changed: Another medication with the same name was added. Make sure you understand how and when to take each.   pantoprazole 40 MG tablet Commonly known as: PROTONIX Take 1 tablet (40 mg total) by mouth daily. What changed: You were already taking a medication with the same name, and this prescription was added. Make sure you understand how and when to take each.   Ubrelvy 100 MG Tabs Generic drug: Ubrogepant TAKE 1 TABLET BY MOUTH AS NEEDED MAY REPEAT IN 2 HOURS IF NEEDED MAX 2 TABLETS IN 24 HOURS What changed:  when to take this reasons to take this   Vilazodone HCl 40 MG Tabs Commonly known as: VIIBRYD Take 40 mg by mouth daily.   Vitamin D (Ergocalciferol) 1.25 MG (50000 UNIT) Caps capsule Commonly known as: DRISDOL Take 1 capsule (50,000 Units total) by mouth every 7 (seven) days.   Zembrace SymTouch 3 MG/0.5ML Soaj Generic drug: SUMAtriptan Succinate Inject 3 mg into the skin as needed. What changed: reasons to take this   ZzzQuil 50 MG/30ML  Liqd Generic drug: diphenhydrAMINE HCl (Sleep) Take 30 mLs by mouth at bedtime as needed (Sleep).        Activity: no heavy lifting for 4 weeks Diet: Bariatric Wound Care: keep wound clean and dry  Follow-up:  With Dr. Dossie Der  Signed: Hyman Hopes Harinder Romas [facility] Bariatric, [facility] Minimally Invasive [facility], [place]   03/07/2023, 8:15 AM

## 2023-03-07 NOTE — Progress Notes (Signed)
Discharge instructions discussed with patient and family, verbalized agreement and understanding 

## 2023-03-07 NOTE — Progress Notes (Signed)
Patient alert and oriented, Post op day 2.  Provided support and encouragement.  Encouraged pulmonary toilet, ambulation and small sips of liquids.   Pt vocalized readiness for discharge. Discharge education complete. All questions answered.    Thank you,  Lubertha Basque, RN, MSN Bariatric Nurse Coordinator [phone] (office)

## 2023-03-07 NOTE — Progress Notes (Signed)
Patient alert and oriented, working towards pain controlled. Patient is tolerating fluids overall, advanced to protein shake today. Reviewed Gastric sleeve/bypass discharge instructions with patient and patient is able to articulate understanding. Provided information on BELT program, Support Group, BSTOP-D, and [facility] outpatient pharmacy. Communicated general update of patient status to surgeon. All questions answered. 24hr fluid recall is 300 mL per hydration protocol, bariatric nurse coordinator to make follow-up phone call within one week.

## 2023-03-07 NOTE — Progress Notes (Signed)
Patient alert and oriented, Post op day 1.  Provided support and encouragement.  Encouraged pulmonary toilet, ambulation and small sips of liquids.  All questions answered, updated surgeon of pt concerns.  Will continue to monitor.

## 2023-03-07 NOTE — Progress Notes (Signed)
Discussed QI "Goals for Discharge" document with patient including ambulation in halls, Incentive Spirometry use every hour, and oral care.  Also discussed pain and nausea control.  Enabled or verified head of bed 30 degree alarm activated.  BSTOP education provided including BSTOP information guide, "Guide for Pain Management after your Bariatric Procedure".  Diet progression education provided including "Bariatric Surgery Post-Op Food Plan Phase 1: Liquids".  Questions answered.  Will continue to partner with bedside RN and follow up with patient per protocol.  

## 2023-03-08 ENCOUNTER — Telehealth: Payer: Self-pay

## 2023-03-08 NOTE — Transitions of Care (Post Inpatient/ED Visit) (Signed)
03/08/2023  Name: Emma Wagner MRN: [id number] DOB: 1970/10/21  Today's TOC FU Call Status: Today's TOC FU Call Status:: Successful TOC FU Call Competed TOC FU Call Complete Date: 03/08/23  Transition Care Management Follow-up Telephone Call Date of Discharge: 03/07/23 Discharge Facility: [facility]) Type of Discharge: Inpatient Admission Primary Inpatient Discharge Diagnosis:: "morbid obesity s/p gastric bypass" How have you been since you were released from the hospital?: Same (Pt voices pain is controlled-3/10 at present-last took  pain med-Tylenol last night. She is trying to take least amt of pain med as possible. Reports some gas and nausea-taking meds.) Any questions or concerns?: Yes Patient Questions/Concerns:: She is trying to drink fluids as directed but having some abd discomfort-will call surgeon office to discuss sxs and possible meds to take to help (She is trying to drink fluids as directed but having some abd discomfort-will call surgeon office to discuss sxs and possible meds to take to help) Patient Questions/Concerns Addressed: Other: (RN CMreviewd sx mgmt measures wtih pt-nonpharmacological and encouraged her to call surgeon office to discuss and get recommendations)  Items Reviewed: Did you receive and understand the discharge instructions provided?: Yes Medications obtained,verified, and reconciled?: Yes (Medications Reviewed) Any new allergies since your discharge?: No Dietary orders reviewed?: Yes Type of Diet Ordered:: full liquid diet Do you have support at home?: Yes People in Home: significant other  Medications Reviewed Today: Medications Reviewed Today     Reviewed by Charlyn Minerva, RN (Registered Nurse) on 03/08/23 at 1140  Med List Status: <None>   Medication Order Taking? Sig Documenting Provider Last Dose Status Informant  acetaminophen (TYLENOL) 500 MG tablet [id number] Yes Take 2 tablets (1,000 mg total) by mouth every 8  (eight) hours for 5 days. Stechschulte, Hyman Hopes, MD Taking Active   amLODipine (NORVASC) 2.5 MG tablet [id number] Yes TAKE 1 TABLET BY MOUTH EVERY DAY Sheliah Hatch, MD Taking Active Self  buPROPion (WELLBUTRIN XL) 150 MG 24 hr tablet [id number] Yes Take 150 mg by mouth every morning. [provider] Taking Active Self  diphenhydrAMINE HCl, Sleep, (ZZZQUIL) 50 MG/30ML LIQD [id number]  Take 30 mLs by mouth at bedtime as needed (Sleep). [provider]  Active Self  diphenhydramine-acetaminophen (TYLENOL PM) 25-500 MG TABS tablet [id number]  Take 2 tablets by mouth at bedtime as needed (sleep). [provider]  Active Self  DULoxetine (CYMBALTA) 30 MG capsule [id number]  Take 30 mg by mouth 2 (two) times daily. [provider]  Active Self           Med Note Hassell Done, Leanne Chang Feb 28, 2023  2:04 PM) Holding until after gastric surgery  famotidine (PEPCID) 40 MG tablet [id number] Yes TAKE 1 TABLET BY MOUTH EVERYDAY AT BEDTIME Sheliah Hatch, MD Taking Active Self  gabapentin (NEURONTIN) 100 MG capsule [id number]  Take 2 capsules (200 mg total) by mouth every 12 (twelve) hours. Stechschulte, Hyman Hopes, MD  Active   methocarbamol (ROBAXIN-750) 750 MG tablet [id number] No Take 1 tablet (750 mg total) by mouth every 6 (six) hours as needed for muscle spasms.  Patient not taking: Reported on 03/08/2023   Stechschulte, Hyman Hopes, MD Not Taking Active   norethindrone (AYGESTIN) 5 MG tablet [id number] Yes Take 5 mg by mouth daily. To stop periods x 1 month in order to have gastric surgery [provider] Taking Active   nystatin cream (MYCOSTATIN) [id number] No Apply 1 Application topically daily as needed (Rash near lip).  Patient not taking: Reported on 03/08/2023   [provider] Not Taking Active Self  ondansetron (ZOFRAN) 4 MG tablet [id number]  Take 1 tablet (4 mg total) by mouth every 8 (eight) hours as needed for nausea or vomiting. Sheliah Hatch, MD  Active Self  ondansetron (ZOFRAN-ODT) 4 MG disintegrating tablet [id number] Yes Take 1 tablet (4 mg total) by mouth every 6 (six) hours as needed for nausea or vomiting. Stechschulte, Hyman Hopes, MD Taking Active   oxyCODONE (OXY IR/ROXICODONE) 5 MG immediate release tablet [id number] Yes Take 1 tablet (5 mg total) by mouth every 6 (six) hours as needed for severe pain. Stechschulte, Hyman Hopes, MD Taking Active   pantoprazole (PROTONIX) 40 MG tablet [id number] Yes Take 1 tablet (40 mg total) by mouth daily. Stechschulte, Hyman Hopes, MD Taking Active   simethicone (MYLICON) 80 MG chewable tablet [id number] Yes Chew 80 mg by mouth every 6 (six) hours as needed for flatulence. [provider] Taking Active Self  Ubrogepant (UBRELVY) 100 MG TABS [id number] Yes TAKE 1 TABLET BY MOUTH AS NEEDED MAY REPEAT IN 2 HOURS IF NEEDED MAX 2 TABLETS IN 24 HOURS  Patient taking differently: daily as needed (MIgraine). TAKE 1 TABLET BY MOUTH AS NEEDED MAY REPEAT IN 2 HOURS IF NEEDED MAX 2 TABLETS IN 24 HOURS   [place], Adam R, DO Taking Active Self  Vilazodone HCl (VIIBRYD) 40 MG TABS [id number] Yes Take 40 mg by mouth daily. [provider] Taking Active Self  Vitamin D, Ergocalciferol, (DRISDOL) 1.25 MG (50000 UNIT) CAPS capsule [id number] No Take 1 capsule (50,000 Units total) by mouth every 7 (seven) days.  Patient not taking: Reported on 03/08/2023   Sheliah Hatch, MD Not Taking Active Self  [facility] 3 MG/0.5ML Ivory Broad [id number] Yes Inject 3 mg into the skin as needed.  Patient taking differently: Inject 3 mg into the skin as needed (Migraine).   Drema Dallas, DO Taking Active Self            Home Care and Equipment/Supplies: Were Home Health Services Ordered?: NA Any new equipment or medical supplies ordered?: NA  Functional Questionnaire: Do you need assistance with bathing/showering or dressing?: No Do you need assistance with meal preparation?: No Do you need assistance  with eating?: No Do you have difficulty maintaining continence: No Do you need assistance with getting out of bed/getting out of a chair/moving?: No Do you have difficulty managing or taking your medications?: No  Follow up appointments reviewed: PCP Follow-up appointment confirmed?: NA Specialist Hospital Follow-up appointment confirmed?: Yes Date of Specialist follow-up appointment?: 03/21/23 Follow-Up Specialty Provider:: Dr. Dossie Der Do you need transportation to your follow-up appointment?: No Do you understand care options if your condition(s) worsen?: Yes-patient verbalized understanding  SDOH Interventions Today    Flowsheet Row Most Recent Value  SDOH Interventions   Food Insecurity Interventions Intervention Not Indicated  Transportation Interventions Intervention Not Indicated      TOC Interventions Today    Flowsheet Row Most Recent Value  TOC Interventions   TOC Interventions Discussed/Reviewed TOC Interventions Discussed, Post op wound/incision care, Post discharge activity limitations per provider, S/S of infection      Interventions Today    Flowsheet Row Most Recent Value  General Interventions   General Interventions Discussed/Reviewed General Interventions Discussed, Doctor Visits  Doctor Visits Discussed/Reviewed Doctor Visits Discussed, PCP, Specialist  PCP/Specialist Visits Compliance with follow-up visit  Education Interventions   Education Provided Provided Education  Provided Verbal Education On Nutrition,  When to see the doctor, Medication, Other  [sx mgmt]  Nutrition Interventions   Nutrition Discussed/Reviewed Nutrition Discussed, Fluid intake  [pt is on full liquid diet at present]  Pharmacy Interventions   Pharmacy Dicussed/Reviewed Pharmacy Topics Discussed, Medications and their functions  Safety Interventions   Safety Discussed/Reviewed Safety Discussed       Alessandra Grout [facility] Care Management Care  Management Community Coordinator Direct Phone: [phone] Toll Free: [phone] Fax: [phone]

## 2023-03-19 ENCOUNTER — Telehealth (HOSPITAL_COMMUNITY): Payer: Self-pay | Admitting: *Deleted

## 2023-03-19 ENCOUNTER — Encounter: Payer: 59 | Attending: Surgery | Admitting: Skilled Nursing Facility1

## 2023-03-19 VITALS — Ht 66.0 in | Wt 229.6 lb

## 2023-03-19 DIAGNOSIS — E669 Obesity, unspecified: Secondary | ICD-10-CM | POA: Insufficient documentation

## 2023-03-19 NOTE — Telephone Encounter (Signed)
1. Tell me about your pain and pain management?    Pt c/o of intermittent right lower abdominal incisional pain- described it as a more nagging pain.   Encouraged pt to contact [facility] if still concerned. Pt stated that it wasn't too bad and that she would wait to let the surgeon know at her follow-up     2. Let's talk about fluid intake. How much total fluid are you taking in?   Pt states that s/he is getting in at least 64oz of fluid including protein shakes, bottled water,   3. How much protein have you taken in the last day?    Pt states she is meeting the goal of 60g of protein each day with the protein shakes    4. Tell me what your incisions look like?  "Incisions look fine" with the exception of 1 that has feels a little lumpy under the bottom right incision- I will ask the doctor about it at my follow-up". Pt states it "opened up a little and was stinging". Pt denies a fever, chills. Pt states that the other incisions are "fine" and are not swollen, open, or draining. Pt encouraged to notify surgeon for concern about incision.     5. Have you been passing gas? BM?   Pt states that they are having BMs.   Pt states that they have had a BM. Pt instructed to take either Miralax or MoM as instructed per "Gastric Bypass/Sleeve Discharge Home Care Instructions". Pt to call surgeon's office if not able to have BM with medication.    6. If a problem or question were to arise who would you call? Do you know contact numbers for [facility], [facility], and [facility]?   Pt knows to call [facility] for surgical, [facility] for nutrition, and [facility] for non-urgent questions or concerns. Pt denies dehydration symptoms. Pt can describe s/sx of dehydration.   7. How has the walking going?   Pt states s/he is walking around and able to be active without difficulty.   8. How are your vitamins and calcium going? How are you taking them?    Pt states that s/he is taking his/her supplements and vitamins without difficulty.

## 2023-03-20 ENCOUNTER — Encounter: Payer: Self-pay | Admitting: Family Medicine

## 2023-03-20 ENCOUNTER — Encounter: Payer: Self-pay | Admitting: Skilled Nursing Facility1

## 2023-03-20 NOTE — Progress Notes (Signed)
2 Week Post-Operative Nutrition Class   Patient was seen on 03/20/2023 for Post-Operative Nutrition education at the [facility].    Surgery date: 03/05/2023 Surgery type: RYGB Start weight at [facility]: 235.7 Weight today: 229.6 Bowel Habits: Every day to every other day no complaints   Body Composition Scale 03/20/2023  Current Body Weight 229.6  Total Body Fat % 42.7  Visceral Fat 14  Fat-Free Mass % 57.2   Total Body Water % 43.1  Muscle-Mass lbs 32.2  BMI 37  Body Fat Displacement          Torso  lbs 60.7         Left Leg  lbs 12.1         Right Leg  lbs 12.1         Left Arm  lbs 6.0         Right Arm  lbs 6.0      The following the learning objectives were met by the patient during this course: Identifies Soft Prepped Plan Advancement Guide  Identifies Soft, High Proteins (Phase 1), beginning 2 weeks post-operatively to 3 weeks post-operatively Identifies Additional Soft High Proteins, soft non-starchy vegetables, fruits and starches (Phase 2), beginning 3 weeks post-operatively to 3 months post-operatively Identifies appropriate sources of fluids, proteins, vegetables, fruits and starches Identifies appropriate fat sources and healthy verses unhealthy fat types   States protein, vegetable, fruit and starch recommendations and appropriate sources post-operatively Identifies the need for appropriate texture modifications, mastication, and bite sizes when consuming solids Identifies appropriate fat consumption and sources Identifies appropriate multivitamin and calcium sources post-operatively Describes the need for physical activity post-operatively and will follow MD recommendations States when to call healthcare provider regarding medication questions or post-operative complications   Handouts given during class include: Soft Prepped Plan Advancement Guide   Follow-Up Plan: Patient will follow-up at [facility] in 10 weeks for 3 month post-op  nutrition visit for diet advancement per MD.

## 2023-03-22 ENCOUNTER — Encounter: Payer: Self-pay | Admitting: Family Medicine

## 2023-03-22 ENCOUNTER — Ambulatory Visit (INDEPENDENT_AMBULATORY_CARE_PROVIDER_SITE_OTHER): Payer: 59 | Admitting: Family Medicine

## 2023-03-22 VITALS — BP 118/84 | HR 90 | Temp 98.0°F | Resp 18 | Ht 66.0 in | Wt 228.0 lb

## 2023-03-22 DIAGNOSIS — I10 Essential (primary) hypertension: Secondary | ICD-10-CM | POA: Diagnosis not present

## 2023-03-22 DIAGNOSIS — G4700 Insomnia, unspecified: Secondary | ICD-10-CM

## 2023-03-22 DIAGNOSIS — R42 Dizziness and giddiness: Secondary | ICD-10-CM | POA: Diagnosis not present

## 2023-03-22 MED ORDER — TEMAZEPAM 15 MG PO CAPS: 15.0000 mg | ORAL_CAPSULE | Freq: Every evening | ORAL | 3 refills | Status: DC | PRN

## 2023-03-22 MED ORDER — MECLIZINE HCL 25 MG PO TABS: 25.0000 mg | ORAL_TABLET | Freq: Three times a day (TID) | ORAL | 0 refills | Status: DC | PRN

## 2023-03-22 NOTE — Progress Notes (Signed)
   Subjective:    Patient ID: Emma Wagner, female    DOB: 23-Mar-1971, 52 y.o.   MRN: [id number]  HPI Dizziness- stopped Amlodipine 3 days ago but is more dizzy today than previously.  Pt is s/p gastric bypass and was worried that dizziness was from BP dropping low.  Today notes dizziness occurs when turning her head.  Is getting 64 oz of water.    Insomnia- pt having a hard time falling asleep despite OTC meds.  Is not interested in anything that can cause parasomnias.  Currently on Viibryd, Cymbalta, and Wellbutrin XL.  Is fearful of serotonin syndrome.   Review of Systems For ROS see HPI     Objective:   Physical Exam Vitals reviewed.  Constitutional:      General: She is not in acute distress.    Appearance: Normal appearance. She is well-developed. She is not ill-appearing.  HENT:     Head: Normocephalic and atraumatic.     Right Ear: Tympanic membrane and ear canal normal.     Left Ear: Tympanic membrane and ear canal normal.     Nose: No congestion or rhinorrhea.     Mouth/Throat:     Mouth: Mucous membranes are normal.     Pharynx: Uvula midline.  Eyes:     Extraocular Movements: EOM normal.     Conjunctiva/sclera: Conjunctivae normal.     Pupils: Pupils are equal, round, and reactive to light.     Comments: 2-3 beats of horizontal nystagmus  Cardiovascular:     Rate and Rhythm: Normal rate and regular rhythm.     Heart sounds: Normal heart sounds.  Pulmonary:     Effort: Pulmonary effort is normal. No respiratory distress.     Breath sounds: Normal breath sounds. No wheezing or rales.  Musculoskeletal:        General: No edema.     Cervical back: Normal range of motion and neck supple.  Lymphadenopathy:     Cervical: No cervical adenopathy.  Skin:    General: Skin is warm and dry.  Neurological:     Mental Status: She is alert and oriented to person, place, and time.     Cranial Nerves: No cranial nerve deficit.     Deep Tendon Reflexes: Reflexes are normal  and symmetric.  Psychiatric:        Mood and Affect: Mood and affect normal.        Behavior: Behavior normal.        Thought Content: Thought content normal.        Judgment: Judgment normal.           Assessment & Plan:

## 2023-03-22 NOTE — Patient Instructions (Signed)
Follow up as needed or as scheduled Continue to HOLD the Amlodipine until I see you next month Continue to drink LOTS of water USE the Meclizine as needed for dizziness Change positions- including turning your head- slowly to give yourself time to adjust Call with any questions or concerns- particularly if not getting better or getting worse Hang in there!  You look great!!!

## 2023-03-28 ENCOUNTER — Telehealth: Payer: Self-pay | Admitting: Dietician

## 2023-03-28 NOTE — Telephone Encounter (Signed)
RD called pt to verify fluid intake once starting soft, solid proteins 2 week post-bariatric surgery.   Daily Fluid intake: 40-45 oz. Daily Protein intake: 60 g Bowel Habits: every 3-4 days, pt states she will take Miralax after about 3 days. Pt agreeable to use Miralax daily to every other day.  Concerns/issues: pt states she is doing pretty good, stating she only had one episode when she at too fast. Pt states she is weighing daily, and gets upset when the scale doesn't move. Pt agreeable to try weighing once a week.

## 2023-03-29 DIAGNOSIS — G4700 Insomnia, unspecified: Secondary | ICD-10-CM | POA: Insufficient documentation

## 2023-03-29 DIAGNOSIS — R42 Dizziness and giddiness: Secondary | ICD-10-CM | POA: Insufficient documentation

## 2023-03-29 NOTE — Assessment & Plan Note (Signed)
New.  Pt initially thought her dizziness was due to low blood pressure but stopping her Amlodipine has not improved sxs.  She notes sxs occur w/ turning her head.  Drinking plenty of water since surgery.  Will start Meclizine to use prn.  Reviewed supportive care and red flags that should prompt return.  Pt expressed understanding and is in agreement w/ plan.

## 2023-03-29 NOTE — Assessment & Plan Note (Signed)
Holding Amlodipine due to rapid weight loss and decrease in BP.

## 2023-03-29 NOTE — Assessment & Plan Note (Signed)
New.  Pt is taking OTC sleep aids w/o relief.  Surgeon recommended Doxepin but she is fearful of serotonin syndrome.  This also rules out Trazodone.  Will start Restoril nightly and monitor for improvement.

## 2023-04-04 ENCOUNTER — Encounter (INDEPENDENT_AMBULATORY_CARE_PROVIDER_SITE_OTHER): Payer: Self-pay

## 2023-04-16 DIAGNOSIS — F411 Generalized anxiety disorder: Secondary | ICD-10-CM | POA: Diagnosis not present

## 2023-04-16 DIAGNOSIS — F4310 Post-traumatic stress disorder, unspecified: Secondary | ICD-10-CM | POA: Diagnosis not present

## 2023-04-16 DIAGNOSIS — F3342 Major depressive disorder, recurrent, in full remission: Secondary | ICD-10-CM | POA: Diagnosis not present

## 2023-04-20 ENCOUNTER — Other Ambulatory Visit: Payer: Self-pay | Admitting: Family Medicine

## 2023-04-24 ENCOUNTER — Encounter: Payer: Self-pay | Admitting: Family Medicine

## 2023-04-24 DIAGNOSIS — N920 Excessive and frequent menstruation with regular cycle: Secondary | ICD-10-CM | POA: Diagnosis not present

## 2023-04-24 NOTE — Telephone Encounter (Signed)
Pt reports the temazepam is working great and is asking for updated prescription so she can take it every night

## 2023-04-30 DIAGNOSIS — N920 Excessive and frequent menstruation with regular cycle: Secondary | ICD-10-CM | POA: Diagnosis not present

## 2023-05-02 ENCOUNTER — Ambulatory Visit: Payer: BC Managed Care – PPO | Admitting: Family Medicine

## 2023-05-03 ENCOUNTER — Encounter: Payer: Self-pay | Admitting: Family Medicine

## 2023-05-03 ENCOUNTER — Ambulatory Visit (INDEPENDENT_AMBULATORY_CARE_PROVIDER_SITE_OTHER): Payer: 59 | Admitting: Family Medicine

## 2023-05-03 VITALS — BP 128/70 | HR 76 | Temp 97.8°F | Resp 18 | Ht 66.0 in | Wt 217.0 lb

## 2023-05-03 DIAGNOSIS — Z23 Encounter for immunization: Secondary | ICD-10-CM | POA: Diagnosis not present

## 2023-05-03 DIAGNOSIS — I10 Essential (primary) hypertension: Secondary | ICD-10-CM

## 2023-05-03 DIAGNOSIS — E669 Obesity, unspecified: Secondary | ICD-10-CM | POA: Diagnosis not present

## 2023-05-03 DIAGNOSIS — K219 Gastro-esophageal reflux disease without esophagitis: Secondary | ICD-10-CM

## 2023-05-03 LAB — CBC WITH DIFFERENTIAL/PLATELET
Basophils Absolute: 0.1 10*3/uL (ref 0.0–0.1)
Basophils Relative: 1.2 % (ref 0.0–3.0)
Eosinophils Absolute: 0.1 10*3/uL (ref 0.0–0.7)
Eosinophils Relative: 1.1 % (ref 0.0–5.0)
HCT: 40.7 % (ref 36.0–46.0)
Hemoglobin: 12.8 g/dL (ref 12.0–15.0)
Lymphocytes Relative: 29.3 % (ref 12.0–46.0)
Lymphs Abs: 2.2 10*3/uL (ref 0.7–4.0)
MCHC: 31.5 g/dL (ref 30.0–36.0)
MCV: 94.3 fl (ref 78.0–100.0)
Monocytes Absolute: 0.5 10*3/uL (ref 0.1–1.0)
Monocytes Relative: 6.1 % (ref 3.0–12.0)
Neutro Abs: 4.6 10*3/uL (ref 1.4–7.7)
Neutrophils Relative %: 62.3 % (ref 43.0–77.0)
Platelets: 248 10*3/uL (ref 150.0–400.0)
RBC: 4.31 Mil/uL (ref 3.87–5.11)
RDW: 14.1 % (ref 11.5–15.5)
WBC: 7.4 10*3/uL (ref 4.0–10.5)

## 2023-05-03 LAB — LIPID PANEL
Cholesterol: 124 mg/dL (ref 0–200)
HDL: 35 mg/dL — ABNORMAL LOW (ref >39.00)
LDL Cholesterol: 73 mg/dL (ref 0–99)
NonHDL: 88.97
Total CHOL/HDL Ratio: 4
Triglycerides: 81 mg/dL (ref 0.0–149.0)
VLDL: 16.2 mg/dL (ref 0.0–40.0)

## 2023-05-03 LAB — TSH: TSH: 2.57 u[IU]/mL (ref 0.35–5.50)

## 2023-05-03 LAB — BASIC METABOLIC PANEL
BUN: 16 mg/dL (ref 6–23)
CO2: 25 meq/L (ref 19–32)
Calcium: 9.1 mg/dL (ref 8.4–10.5)
Chloride: 107 meq/L (ref 96–112)
Creatinine, Ser: 0.97 mg/dL (ref 0.40–1.20)
GFR: 67.24 mL/min (ref >60.00)
Glucose, Bld: 58 mg/dL — ABNORMAL LOW (ref 70–99)
Potassium: 3.9 meq/L (ref 3.5–5.1)
Sodium: 141 meq/L (ref 135–145)

## 2023-05-03 LAB — HEPATIC FUNCTION PANEL
ALT: 30 U/L (ref 0–35)
AST: 18 U/L (ref 0–37)
Albumin: 4 g/dL (ref 3.5–5.2)
Alkaline Phosphatase: 52 U/L (ref 39–117)
Bilirubin, Direct: 0.1 mg/dL (ref 0.0–0.3)
Total Bilirubin: 0.4 mg/dL (ref 0.2–1.2)
Total Protein: 6.7 g/dL (ref 6.0–8.3)

## 2023-05-03 LAB — HEMOGLOBIN A1C: Hgb A1c MFr Bld: 6 % (ref 4.6–6.5)

## 2023-05-03 LAB — VITAMIN D 25 HYDROXY (VIT D DEFICIENCY, FRACTURES): VITD: 61.46 ng/mL (ref 30.00–100.00)

## 2023-05-03 NOTE — Assessment & Plan Note (Signed)
Hopefully improved w/ recent weight loss.  Will stop Famotidine and see if sxs return.  If so, restart Famotidine.  If not, we can try stopping the Pantoprazole in the future.  Pt expressed understanding and is in agreement w/ plan.

## 2023-05-03 NOTE — Assessment & Plan Note (Signed)
Improving.  Pt is down 11 more lbs since 8/2.  Applauded her efforts.  She has joined the gym, successfully come off her BP medications, and hopefully reflux is improving.  Will continue to follow.

## 2023-05-03 NOTE — Assessment & Plan Note (Signed)
Resolved.  Pt successfully stopped Amlodipine and BP remains well controlled.  No meds needed at this time.  Will follow.

## 2023-05-03 NOTE — Progress Notes (Signed)
Subjective:    Patient ID: Emma Wagner, female    DOB: 12/23/1970, 52 y.o.   MRN: [id number]  HPI HTN- at last visit we stopped Amlodipine.  BP remains well controlled.  Since pt has lost considerable weight s/p gastric bypass, bp has been in normal range.  No CP, SOB, HA's, visual changes, edema.  Obesity- pt is down 11 lbs since 8/2.  Pt reports feeling 'very good'.  Has joined the gym.    GERD- ongoing issue for pt.  She is currently on Famotidine 40mg  nightly and Pantoprazole 40mg  daily.  Wondering if she is able to come off either medication with her recent weight loss.   Review of Systems For ROS see HPI     Objective:   Physical Exam Vitals reviewed.  Constitutional:      General: She is not in acute distress.    Appearance: Normal appearance. She is well-developed. She is obese. She is not ill-appearing.  HENT:     Head: Normocephalic and atraumatic.  Eyes:     Conjunctiva/sclera: Conjunctivae normal.     Pupils: Pupils are equal, round, and reactive to light.  Neck:     Thyroid: No thyromegaly.  Cardiovascular:     Rate and Rhythm: Normal rate and regular rhythm.     Heart sounds: Normal heart sounds. No murmur heard. Pulmonary:     Effort: Pulmonary effort is normal. No respiratory distress.     Breath sounds: Normal breath sounds.  Abdominal:     General: There is no distension.     Palpations: Abdomen is soft.     Tenderness: There is no abdominal tenderness.  Musculoskeletal:     Cervical back: Normal range of motion and neck supple.     Right lower leg: No edema.     Left lower leg: No edema.  Lymphadenopathy:     Cervical: No cervical adenopathy.  Skin:    General: Skin is warm and dry.  Neurological:     General: No focal deficit present.     Mental Status: She is alert and oriented to person, place, and time.  Psychiatric:        Behavior: Behavior normal.           Assessment & Plan:

## 2023-05-03 NOTE — Patient Instructions (Signed)
Schedule your complete physical in 6 months We'll notify you of your lab results and make any changes if needed Keep up the good work on healthy diet and regular exercise- you're doing great! STOP the Famotidine and see if your reflux remains controlled on the Pantoprazole Call with any questions or concerns Stay Safe!  Stay Healthy! Happy Fall!

## 2023-05-12 ENCOUNTER — Other Ambulatory Visit: Payer: Self-pay | Admitting: Family Medicine

## 2023-05-15 ENCOUNTER — Encounter: Payer: Self-pay | Admitting: Family Medicine

## 2023-05-20 MED ORDER — TEMAZEPAM 30 MG PO CAPS: 30.0000 mg | ORAL_CAPSULE | Freq: Every day | ORAL | 0 refills | Status: DC

## 2023-05-20 NOTE — Addendum Note (Signed)
Addended by: Sheliah Hatch on: 05/20/2023 01:52 PM   Modules accepted: Orders

## 2023-06-03 ENCOUNTER — Ambulatory Visit: Payer: 59 | Admitting: Dietician

## 2023-06-11 ENCOUNTER — Encounter: Payer: Self-pay | Admitting: Family Medicine

## 2023-06-12 MED ORDER — VALACYCLOVIR HCL 1 G PO TABS: ORAL_TABLET | ORAL | 0 refills | Status: DC

## 2023-06-12 NOTE — Addendum Note (Signed)
Addended by: Sheliah Hatch on: 06/12/2023 03:53 PM   Modules accepted: Orders

## 2023-06-17 ENCOUNTER — Ambulatory Visit: Payer: 59 | Admitting: Dietician

## 2023-06-18 ENCOUNTER — Ambulatory Visit: Payer: 59 | Admitting: Dietician

## 2023-06-25 ENCOUNTER — Encounter: Payer: Self-pay | Admitting: Dietician

## 2023-06-25 ENCOUNTER — Encounter: Payer: 59 | Attending: Surgery | Admitting: Dietician

## 2023-06-25 VITALS — Ht 66.0 in | Wt 194.2 lb

## 2023-06-25 DIAGNOSIS — E669 Obesity, unspecified: Secondary | ICD-10-CM | POA: Insufficient documentation
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
## 2023-06-27 ENCOUNTER — Inpatient Hospital Stay (HOSPITAL_COMMUNITY): Payer: 59

## 2023-06-27 ENCOUNTER — Encounter (HOSPITAL_COMMUNITY): Payer: Self-pay | Admitting: Obstetrics and Gynecology

## 2023-06-27 ENCOUNTER — Other Ambulatory Visit: Payer: Self-pay

## 2023-06-27 ENCOUNTER — Inpatient Hospital Stay (HOSPITAL_COMMUNITY)
Admission: AD | Admit: 2023-06-27 | Discharge: 2023-06-27 | Disposition: A | Discharge status: Home / Self Care | Payer: 59 | Attending: Obstetrics and Gynecology | Admitting: Obstetrics and Gynecology

## 2023-06-27 DIAGNOSIS — Z9889 Other specified postprocedural states: Secondary | ICD-10-CM

## 2023-06-27 DIAGNOSIS — Z9049 Acquired absence of other specified parts of digestive tract: Secondary | ICD-10-CM | POA: Insufficient documentation

## 2023-06-27 DIAGNOSIS — N939 Abnormal uterine and vaginal bleeding, unspecified: Secondary | ICD-10-CM | POA: Diagnosis not present

## 2023-06-27 MED ORDER — DOXYCYCLINE HYCLATE 100 MG PO CAPS: 100.0000 mg | ORAL_CAPSULE | Freq: Two times a day (BID) | ORAL | 0 refills | Status: AC

## 2023-06-27 MED ORDER — ONDANSETRON HCL 4 MG PO TABS: 4.0000 mg | ORAL_TABLET | Freq: Three times a day (TID) | ORAL | 0 refills | Status: DC | PRN

## 2023-06-27 NOTE — MAU Note (Signed)
Emma Wagner is a 52 y.o. at Unknown here in MAU reporting: she had an ablation done last month and is now having a orange tissue discharge and watery blood tinged discharge.  Reports discharge began approx 1 week ago. LMP: 06/03/2023 Onset of complaint: 1 week Pain score: 0 Vitals:   06/27/23 1749  BP: 108/76  Pulse: 89  Resp: 20  Temp: 98 F (36.7 C)  SpO2: 98%     FHT:NA Lab orders placed from triage:   None

## 2023-06-27 NOTE — Progress Notes (Signed)
Pt called for triage, no answer/response.  Wii call again in 10-15 minutes

## 2023-06-27 NOTE — MAU Provider Note (Signed)
History     CSN: [id number]  Arrival date and time: 06/27/23 1700   None    "Orange tissue and strange discharge after my ablation"  Patient presents today for evaluation of abnormal discharge and possible tissue passage after uterine ablation. S/p uterine ablation (Hysteroscopy with myosure resection of benign polyps and Minerva ablation for AUB-A), uncomplicated on 10/14. Denies fevers, chills, N/V, abdominopelvic pain. Has had on and off watery/red-pink discharge but in last 24hrs endorses passage of an orange tissue. Denies dysuria/hematoruia, changes in bowel bladder habits. 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     Pertinent Gynecological History: Menses:  was previously AUB-HMB-IMB before ablation Contraception: tubal ligation DES exposure: denies Blood transfusions: none Sexually transmitted diseases: no past history Previous GYN Procedures: Hysteroscopy/Myosure/Minerva Last mammogram: normal Date: 06/2022 Last pap: normal Date: 06/2022  Past Medical History:  Diagnosis Date   Anemia    Anxiety    add also   Arthritis    Blood transfusion without reported diagnosis    Borderline type 2 diabetes mellitus since 2013   -lost weight and quit smoking and is no longer PRE diabetic   Bursitis    bilateral hips, more on right hip   Constipation    Depression    Frequent urination    while kidney stone evident   GERD (gastroesophageal reflux disease)    Headache    "every blue moon. not often"   Hemorrhoids    History of kidney stones    Hypertension    Obesity    Pneumonia    PONV (postoperative nausea and vomiting)    nausea with appendectomy and with hip replacement in March 2017   Pre-diabetes    Sleep apnea    uses CPAP    Past Surgical History:  Procedure Laterality Date   ABLATION     APPENDECTOMY  08/21/1983   CESAREAN SECTION  08/21/2007   DENTAL SURGERY  08/2022   2 teeth in the back on your right side. One top one bottom   GASTRIC BYPASS      TOTAL HIP ARTHROPLASTY Left 10/31/2015   Procedure: TOTAL HIP ARTHROPLASTY ANTERIOR APPROACH;  Surgeon: Gean Birchwood, MD;  Location: [facility] OR;  Service: Orthopedics;  Laterality: Left;   TOTAL HIP ARTHROPLASTY Right 06/13/2016   Procedure: TOTAL HIP ARTHROPLASTY ANTERIOR APPROACH;  Surgeon: Gean Birchwood, MD;  Location: [facility] OR;  Service: Orthopedics;  Laterality: Right;   TUBAL LIGATION  08/21/2007   WISDOM TOOTH EXTRACTION      Family History  Problem Relation Age of Onset   Arthritis Mother    Hypertension Mother    Sleep apnea Mother    Prostate cancer Father    Skin cancer Father    Congestive Heart Failure Father    Hypertension Father    Snoring Father    Heart failure Other    Hypertension Other    Cancer Other    Anxiety disorder Other    Depression Other    Insomnia Other    Stomach cancer Neg Hx    Colon cancer Neg Hx    Esophageal cancer Neg Hx     Social History   Tobacco Use   Smoking status: Former    Current packs/day: 0.50    Average packs/day: 0.5 packs/day for 1.2 years (0.6 ttl pk-yrs)    Types: Cigarettes    Start date: 04/2022   Smokeless tobacco: Never  Vaping Use   Vaping status: Never Used  Substance Use Topics  Alcohol use: Not Currently    Comment: occasional   Drug use: No    Allergies: No Known Allergies  Medications Prior to Admission  Medication Sig Dispense Refill Last Dose   buPROPion (WELLBUTRIN XL) 150 MG 24 hr tablet Take 150 mg by mouth every morning.   06/27/2023   pantoprazole (PROTONIX) 40 MG tablet Take 1 tablet (40 mg total) by mouth daily. 90 tablet 0 06/27/2023   valACYclovir (VALTREX) 1000 MG tablet Take 1 tab every 12 hrs x2 doses at onset of cold sore.  Repeat prn. 20 tablet 0 06/27/2023   diphenhydrAMINE HCl, Sleep, (ZZZQUIL) 50 MG/30ML LIQD Take 30 mLs by mouth at bedtime as needed (Sleep).      diphenhydramine-acetaminophen (TYLENOL PM) 25-500 MG TABS tablet Take 2 tablets by mouth at bedtime as needed (sleep).       famotidine (PEPCID) 40 MG tablet TAKE 1 TABLET BY MOUTH EVERYDAY AT BEDTIME 30 tablet 5    norethindrone (AYGESTIN) 5 MG tablet Take 5 mg by mouth daily. To stop periods x 1 month in order to have gastric surgery      ondansetron (ZOFRAN) 4 MG tablet Take 1 tablet (4 mg total) by mouth every 8 (eight) hours as needed for nausea or vomiting. 30 tablet 0    ondansetron (ZOFRAN-ODT) 4 MG disintegrating tablet Take 1 tablet (4 mg total) by mouth every 6 (six) hours as needed for nausea or vomiting. 20 tablet 0    temazepam (RESTORIL) 30 MG capsule Take 1 capsule (30 mg total) by mouth at bedtime. 90 capsule 0    Ubrogepant (UBRELVY) 100 MG TABS TAKE 1 TABLET BY MOUTH AS NEEDED MAY REPEAT IN 2 HOURS IF NEEDED MAX 2 TABLETS IN 24 HOURS (Patient taking differently: daily as needed (MIgraine). TAKE 1 TABLET BY MOUTH AS NEEDED MAY REPEAT IN 2 HOURS IF NEEDED MAX 2 TABLETS IN 24 HOURS) 16 tablet 6 Unknown   Vilazodone HCl (VIIBRYD) 40 MG TABS Take 40 mg by mouth daily.      ZEMBRACE SYMTOUCH 3 MG/0.5ML SOAJ Inject 3 mg into the skin as needed. (Patient taking differently: Inject 3 mg into the skin as needed (Migraine).) 0.5 mL 5     Review of Systems  Constitutional:  Negative for chills and fever.  Respiratory:  Negative for shortness of breath.   Cardiovascular:  Negative for chest pain, palpitations and leg swelling.  Gastrointestinal:  Negative for abdominal pain, nausea and vomiting.  Genitourinary:  Positive for vaginal discharge.  Neurological:  Negative for dizziness, weakness and headaches.  Psychiatric/Behavioral:  Negative for suicidal ideas.    Physical Exam   Blood pressure 108/76, pulse 89, temperature 98 F (36.7 C), temperature source Oral, resp. rate 20, height 5\' 6"  (1.676 m), weight 88.4 kg, SpO2 98%.  Physical Exam Exam conducted with a chaperone present.  Constitutional:      General: She is not in acute distress.    Appearance: She is well-developed. She is obese.  HENT:      Head: Normocephalic and atraumatic.  Eyes:     Pupils: Pupils are equal, round, and reactive to light.  Cardiovascular:     Rate and Rhythm: Normal rate and regular rhythm.     Heart sounds: No murmur heard.    No gallop.  Abdominal:     General: Bowel sounds are normal. There is no distension.     Palpations: Abdomen is soft.     Tenderness: There is abdominal tenderness in the suprapubic  area. There is no guarding or rebound.     Hernia: There is no hernia in the left inguinal area or right inguinal area.       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     Exam position: Lithotomy position.     Pubic Area: No rash.      Labia:        Right: No rash, tenderness or lesion.        Left: No rash, tenderness or lesion.      Urethra: No prolapse, urethral pain or urethral lesion.     Vagina: Normal.     Cervix: Normal.     Uterus: Enlarged.      Adnexa: Right adnexa normal and left adnexa normal.     Rectum: Normal.     Comments: 11-12wk anteverted but smooth contour, mobile, mild TTP 1 scopette of light pink discharge cleared from cervix, neg extrusion with valsalva Musculoskeletal:        General: Normal range of motion.     Cervical back: Normal range of motion and neck supple.  Skin:    General: Skin is warm and dry.  Neurological:     Mental Status: She is alert and oriented to person, place, and time.     MAU Course  Procedures  MDM POD#24 s/p in-office Hysteroscopy/myosure/Minerva ablation for AUB. Known adenomyosis and endometrial polyp. Non-surgical abdomen, VSS, low concern for acute abdomen. Only significant finding is mild suprapubic TTP vs fundal TTP. Urine culture and TVUS ordered BP 108/76 (BP Location: Right Arm)   Pulse 89   Temp 98 F (36.7 C) (Oral)   Resp 20   Ht 5\' 6"  (1.676 m)   Wt 88.4 kg   SpO2 98%   BMI 31.44 kg/m    Assessment and Plan  TVUS completed. Read is  still pending after an hour however imaging shows a thin EMS with trace free fluid measuring 0.25cm, anechoic. Blurred EMS border consistent with  prior ablation. Uterus measures 10.6x4.8x6.76cm. BL adnexa appear WNL  Given largely benign exam and imaging with only mild fundal TTP, will send patient home with coverage of doxycycline 100mg  BID x7d. F/u in 1wk via phone, infection signs reviewed with patient. Will also send PRN Zofran ODT (NKDA)  Emma Wagner Emma Wagner 06/27/2023, 6:47 PM

## 2023-06-28 LAB — URINE CULTURE: Culture: NO GROWTH

## 2023-07-31 ENCOUNTER — Encounter: Payer: Self-pay | Admitting: *Deleted

## 2023-07-31 NOTE — Progress Notes (Unsigned)
[facility] [address] [place]. [place] [address] [phone]       OFFICE FOLLOW UP NOTE  [name] Date of Birth:  1971-04-29 Medical Record Number:  [id number]    Primary neurologist: Dr. Frances Furbish Reason for visit: CPAP follow-up  Virtual Visit via Video Note  I connected with Emma Wagner on 08/01/23 at 10:45 AM EST by a video enabled telemedicine application and verified that I am speaking with the correct person using two identifiers.  Location: Patient: at home Provider: in office   I discussed the limitations of evaluation and management by telemedicine and the availability of in person appointments. The patient expressed understanding and agreed to proceed.    SUBJECTIVE:   CHIEF COMPLAINT:  Sleep apnea f/u   Follow-up visit:  Prior visit: 08/02/2022 with Dr. Frances Furbish  Brief HPI:   Emma Wagner is a 52 y.o. female who is being followed for OSA on CPAP.  Sleep study 04/2022 showed severe OSA with total AHI of 36.9/hour, REM AHI of 68.6/hour, supine AHI of  64.2/hour and O2 nadir of 79%.  AutoPap initiated 06/2022.  At prior visit with Dr. Frances Furbish, noted excellent compliance and optimal residual AHI.  Continue current pressure settings.  She denied much benefit from CPAP use except for improvement of snoring and witnessed apneas.  ESS 8/24 (prior to CPAP 9/24).     Interval history:  Returns for CPAP follow-up visit.  Patient stopped using CPAP around October as she has underwent bariatric surgery in July and has lost about 60 pounds since, presurgery weight at 244lbs, today reports weight of 188lb. Prior to stopping, patient was compliance with use and treating apnea well. She questions if sleep apnea still present and if treatment is still needed. She feels like she is sleeping well without machine and no longer having snoring or witnessed apneas.  She continues to have daytime fatigue which was unchanged even with CPAP use.  Reports  tolerating CPAP well but just annoying to use especially if no longer needed, she is willing to restart if needed.              ROS:   14 system review of systems performed and negative with exception of those listed in HPI  PMH:  Past Medical History:  Diagnosis Date   Anemia    Anxiety    add also   Arthritis    Blood transfusion without reported diagnosis    Borderline type 2 diabetes mellitus since 2013   -lost weight and quit smoking and is no longer PRE diabetic   Bursitis    bilateral hips, more on right hip   Constipation    Depression    Frequent urination    while kidney stone evident   GERD (gastroesophageal reflux disease)    Headache    "every blue moon. not often"   Hemorrhoids    History of kidney stones    Hypertension    Obesity    Pneumonia    PONV (postoperative nausea and vomiting)    nausea with appendectomy and with hip replacement in March 2017   Pre-diabetes    Sleep apnea    uses CPAP    PSH:  Past Surgical History:  Procedure Laterality Date   ABLATION     APPENDECTOMY  08/21/1983   CESAREAN SECTION  08/21/2007   DENTAL SURGERY  08/2022   2 teeth in the back on your right side. One top one bottom  GASTRIC BYPASS     TOTAL HIP ARTHROPLASTY Left 10/31/2015   Procedure: TOTAL HIP ARTHROPLASTY ANTERIOR APPROACH;  Surgeon: Gean Birchwood, MD;  Location: [facility] OR;  Service: Orthopedics;  Laterality: Left;   TOTAL HIP ARTHROPLASTY Right 06/13/2016   Procedure: TOTAL HIP ARTHROPLASTY ANTERIOR APPROACH;  Surgeon: Gean Birchwood, MD;  Location: [facility] OR;  Service: Orthopedics;  Laterality: Right;   TUBAL LIGATION  08/21/2007   WISDOM TOOTH EXTRACTION      Social History:  Social History   Socioeconomic History   Marital status: Single    Spouse name: Not on file   Number of children: 2   Years of education: 12   Highest education level: 12th grade  Occupational History   Occupation: unemployed  Tobacco Use   Smoking status: Former     Current packs/day: 0.50    Average packs/day: 0.5 packs/day for 1.3 years (0.6 ttl pk-yrs)    Types: Cigarettes    Start date: 04/2022   Smokeless tobacco: Never  Vaping Use   Vaping status: Never Used  Substance and Sexual Activity   Alcohol use: Not Currently    Comment: occasional   Drug use: No   Sexual activity: Yes    Birth control/protection: None  Other Topics Concern   Not on file  Social History Narrative   Patient is right-handed. She lives in a 2 level home. Drinks one cup of coffee some days. She walks daily.   Married to [place], 2 children   She reports occasional alcohol use but no tobacco products (former smoker) or drug use.   Social Drivers of Corporate investment banker Strain: Low Risk  (01/30/2023)   Overall Financial Resource Strain (CARDIA)    Difficulty of Paying Living Expenses: Not hard at all  Food Insecurity: No Food Insecurity (03/08/2023)   Hunger Vital Sign    Worried About Running Out of Food in the Last Year: Never true    Ran Out of Food in the Last Year: Never true  Transportation Needs: No Transportation Needs (03/08/2023)   PRAPARE - Administrator, Civil Service (Medical): No    Lack of Transportation (Non-Medical): No  Physical Activity: Unknown (01/30/2023)   Exercise Vital Sign    Days of Exercise per Week: 0 days    Minutes of Exercise per Session: Not on file  Stress: No Stress Concern Present (01/30/2023)   [employer] of Occupational Health - Occupational Stress Questionnaire    Feeling of Stress : Only a little  Social Connections: Moderately Isolated (01/30/2023)   Social Connection and Isolation Panel [NHANES]    Frequency of Communication with Friends and Family: More than three times a week    Frequency of Social Gatherings with Friends and Family: Once a week    Attends Religious Services: Never    Database administrator or Organizations: No    Attends Engineer, structural: Not on file    Marital  Status: Living with partner  Intimate Partner Violence: Unknown (02/28/2022)   Received from [employer], [facility]   HITS    Physically Hurt: Not on file    Insult or Talk Down To: Not on file    Threaten Physical Harm: Not on file    Scream or Curse: Not on file    Family History:  Family History  Problem Relation Age of Onset   Arthritis Mother    Hypertension Mother    Sleep apnea Mother    Prostate  cancer Father    Skin cancer Father    Congestive Heart Failure Father    Hypertension Father    Snoring Father    Heart failure Other    Hypertension Other    Cancer Other    Anxiety disorder Other    Depression Other    Insomnia Other    Stomach cancer Neg Hx    Colon cancer Neg Hx    Esophageal cancer Neg Hx     Medications:   Current Outpatient Medications on File Prior to Visit  Medication Sig Dispense Refill   buPROPion (WELLBUTRIN XL) 150 MG 24 hr tablet Take 150 mg by mouth every morning.     diphenhydrAMINE HCl, Sleep, (ZZZQUIL) 50 MG/30ML LIQD Take 30 mLs by mouth at bedtime as needed (Sleep).     diphenhydramine-acetaminophen (TYLENOL PM) 25-500 MG TABS tablet Take 2 tablets by mouth at bedtime as needed (sleep).     famotidine (PEPCID) 40 MG tablet TAKE 1 TABLET BY MOUTH EVERYDAY AT BEDTIME 30 tablet 5   norethindrone (AYGESTIN) 5 MG tablet Take 5 mg by mouth daily. To stop periods x 1 month in order to have gastric surgery     ondansetron (ZOFRAN) 4 MG tablet Take 1 tablet (4 mg total) by mouth every 8 (eight) hours as needed for nausea or vomiting. 30 tablet 0   ondansetron (ZOFRAN) 4 MG tablet Take 1 tablet (4 mg total) by mouth every 8 (eight) hours as needed for nausea or vomiting. 20 tablet 0   ondansetron (ZOFRAN-ODT) 4 MG disintegrating tablet Take 1 tablet (4 mg total) by mouth every 6 (six) hours as needed for nausea or vomiting. 20 tablet 0   pantoprazole (PROTONIX) 40 MG tablet Take 1 tablet (40 mg total) by mouth daily. 90 tablet 0   temazepam  (RESTORIL) 30 MG capsule Take 1 capsule (30 mg total) by mouth at bedtime. 90 capsule 0   Ubrogepant (UBRELVY) 100 MG TABS TAKE 1 TABLET BY MOUTH AS NEEDED MAY REPEAT IN 2 HOURS IF NEEDED MAX 2 TABLETS IN 24 HOURS (Patient taking differently: daily as needed (MIgraine). TAKE 1 TABLET BY MOUTH AS NEEDED MAY REPEAT IN 2 HOURS IF NEEDED MAX 2 TABLETS IN 24 HOURS) 16 tablet 6   valACYclovir (VALTREX) 1000 MG tablet Take 1 tab every 12 hrs x2 doses at onset of cold sore.  Repeat prn. 20 tablet 0   Vilazodone HCl (VIIBRYD) 40 MG TABS Take 40 mg by mouth daily.     ZEMBRACE SYMTOUCH 3 MG/0.5ML SOAJ Inject 3 mg into the skin as needed. (Patient taking differently: Inject 3 mg into the skin as needed (Migraine).) 0.5 mL 5   No current facility-administered medications on file prior to visit.    Allergies:  No Known Allergies    OBJECTIVE: N/A d/t visit type     ASSESSMENT/PLAN: Emma Wagner is a 52 y.o. year old female    Severe sleep apnea: will repeat HST to see if apnea still present with approx 20% weight loss s/p bariatric surgery since initial testing in 04/2022. She is agreeable to restart CPAP if apnea still present, will need new order to obtain updated supplies if she needs to restart CPAP as she changed insurance companies.  She was congratulated on her weight loss, weight loss goal around 160-170lbs, currently at 182 lbs.      Follow-up will be determined after the above   CC:  PCP: Sheliah Hatch, MD    I spent  25 minutes of face-to-face and non-face-to-face time with patient via MyChart video visit.  This included previsit chart review, lab review, study review, order entry, electronic health record documentation, patient education and discussion regarding above diagnoses and treatment plan and answered all other questions to patient's satisfaction  Ihor Austin, [facility]  [facility] [address] [place], [place] [address]  Phone  [phone] Fax [phone] Note: This document was prepared with digital dictation and possible smart phrase technology. Any transcriptional errors that result from this process are unintentional.

## 2023-08-01 ENCOUNTER — Encounter: Payer: Self-pay | Admitting: Adult Health

## 2023-08-01 ENCOUNTER — Telehealth (INDEPENDENT_AMBULATORY_CARE_PROVIDER_SITE_OTHER): Payer: 59 | Admitting: Adult Health

## 2023-08-01 VITALS — Ht 66.0 in | Wt 182.0 lb

## 2023-08-01 DIAGNOSIS — Z9884 Bariatric surgery status: Secondary | ICD-10-CM

## 2023-08-01 DIAGNOSIS — G4733 Obstructive sleep apnea (adult) (pediatric): Secondary | ICD-10-CM | POA: Diagnosis not present

## 2023-08-08 ENCOUNTER — Other Ambulatory Visit: Payer: Self-pay | Admitting: Family Medicine

## 2023-08-08 ENCOUNTER — Ambulatory Visit: Payer: 59 | Admitting: Neurology

## 2023-08-08 DIAGNOSIS — G4733 Obstructive sleep apnea (adult) (pediatric): Secondary | ICD-10-CM

## 2023-08-08 DIAGNOSIS — Z9884 Bariatric surgery status: Secondary | ICD-10-CM

## 2023-09-24 ENCOUNTER — Other Ambulatory Visit: Payer: Self-pay | Admitting: Neurology

## 2023-10-04 ENCOUNTER — Telehealth: Payer: Self-pay

## 2023-10-04 NOTE — Telephone Encounter (Signed)
PA needed ubrelvy

## 2023-10-10 DIAGNOSIS — F3342 Major depressive disorder, recurrent, in full remission: Secondary | ICD-10-CM | POA: Diagnosis not present

## 2023-10-10 DIAGNOSIS — F411 Generalized anxiety disorder: Secondary | ICD-10-CM | POA: Diagnosis not present

## 2023-10-10 DIAGNOSIS — F4310 Post-traumatic stress disorder, unspecified: Secondary | ICD-10-CM | POA: Diagnosis not present

## 2023-10-28 ENCOUNTER — Telehealth: Payer: Self-pay | Admitting: Pharmacist

## 2023-10-28 NOTE — Telephone Encounter (Signed)
 Pharmacy Patient Advocate Encounter  Received notification from [facility] that Prior Authorization for UBRELVY 100MG  has been APPROVED from 3.9.25 to 3.9.26   PA #/Case ID/Reference #: [id number]

## 2023-10-28 NOTE — Telephone Encounter (Signed)
 Pharmacy Patient Advocate Encounter   Received notification from Physician's Office that prior authorization for Ubrelvy 100MG  tablets is required/requested.   Insurance verification completed.   The patient is insured through [facility] .   Per test claim: PA required; PA started via CoverMyMeds. KEY [id number] . Waiting for clinical questions to populate.

## 2023-10-28 NOTE — Telephone Encounter (Signed)
 Clinical questions have been answered and PA submitted. PA currently Pending. Please be advised that most companies allow up to 30 days to make a decision. We will advise when a determination has been made, or follow up in 1 week.   Please reach out to our team, Rx Prior Auth Pool, if you haven't heard back in a week.

## 2023-10-31 ENCOUNTER — Ambulatory Visit (INDEPENDENT_AMBULATORY_CARE_PROVIDER_SITE_OTHER): Payer: 59 | Admitting: Family Medicine

## 2023-10-31 ENCOUNTER — Encounter: Payer: Self-pay | Admitting: Family Medicine

## 2023-10-31 VITALS — BP 128/70 | HR 84 | Temp 98.0°F | Ht 65.0 in | Wt 176.1 lb

## 2023-10-31 DIAGNOSIS — Z720 Tobacco use: Secondary | ICD-10-CM | POA: Diagnosis not present

## 2023-10-31 DIAGNOSIS — Z114 Encounter for screening for human immunodeficiency virus [HIV]: Secondary | ICD-10-CM

## 2023-10-31 DIAGNOSIS — R7401 Elevation of levels of liver transaminase levels: Secondary | ICD-10-CM

## 2023-10-31 DIAGNOSIS — Z0000 Encounter for general adult medical examination without abnormal findings: Secondary | ICD-10-CM | POA: Diagnosis not present

## 2023-10-31 DIAGNOSIS — E663 Overweight: Secondary | ICD-10-CM | POA: Diagnosis not present

## 2023-10-31 DIAGNOSIS — Z1159 Encounter for screening for other viral diseases: Secondary | ICD-10-CM | POA: Diagnosis not present

## 2023-10-31 LAB — LIPID PANEL
Cholesterol: 154 mg/dL (ref 0–200)
HDL: 54.2 mg/dL (ref >39.00)
LDL Cholesterol: 81 mg/dL (ref 0–99)
NonHDL: 100.08
Total CHOL/HDL Ratio: 3
Triglycerides: 93 mg/dL (ref 0.0–149.0)
VLDL: 18.6 mg/dL (ref 0.0–40.0)

## 2023-10-31 LAB — HEPATIC FUNCTION PANEL
ALT: 56 U/L — ABNORMAL HIGH (ref 0–35)
AST: 36 U/L (ref 0–37)
Albumin: 4.3 g/dL (ref 3.5–5.2)
Alkaline Phosphatase: 65 U/L (ref 39–117)
Bilirubin, Direct: 0.1 mg/dL (ref 0.0–0.3)
Total Bilirubin: 0.3 mg/dL (ref 0.2–1.2)
Total Protein: 7.2 g/dL (ref 6.0–8.3)

## 2023-10-31 LAB — CBC WITH DIFFERENTIAL/PLATELET
Basophils Absolute: 0.1 10*3/uL (ref 0.0–0.1)
Basophils Relative: 0.9 % (ref 0.0–3.0)
Eosinophils Absolute: 0.1 10*3/uL (ref 0.0–0.7)
Eosinophils Relative: 1.7 % (ref 0.0–5.0)
HCT: 42.1 % (ref 36.0–46.0)
Hemoglobin: 14 g/dL (ref 12.0–15.0)
Lymphocytes Relative: 40.4 % (ref 12.0–46.0)
Lymphs Abs: 2.5 10*3/uL (ref 0.7–4.0)
MCHC: 33.2 g/dL (ref 30.0–36.0)
MCV: 96.3 fl (ref 78.0–100.0)
Monocytes Absolute: 0.4 10*3/uL (ref 0.1–1.0)
Monocytes Relative: 6.7 % (ref 3.0–12.0)
Neutro Abs: 3.1 10*3/uL (ref 1.4–7.7)
Neutrophils Relative %: 50.3 % (ref 43.0–77.0)
Platelets: 236 10*3/uL (ref 150.0–400.0)
RBC: 4.37 Mil/uL (ref 3.87–5.11)
RDW: 13.2 % (ref 11.5–15.5)
WBC: 6.2 10*3/uL (ref 4.0–10.5)

## 2023-10-31 LAB — BASIC METABOLIC PANEL
BUN: 22 mg/dL (ref 6–23)
CO2: 30 meq/L (ref 19–32)
Calcium: 9.5 mg/dL (ref 8.4–10.5)
Chloride: 106 meq/L (ref 96–112)
Creatinine, Ser: 0.92 mg/dL (ref 0.40–1.20)
GFR: 71.4 mL/min (ref >60.00)
Glucose, Bld: 89 mg/dL (ref 70–99)
Potassium: 4 meq/L (ref 3.5–5.1)
Sodium: 142 meq/L (ref 135–145)

## 2023-10-31 LAB — TSH: TSH: 1.81 u[IU]/mL (ref 0.35–5.50)

## 2023-10-31 LAB — VITAMIN D 25 HYDROXY (VIT D DEFICIENCY, FRACTURES): VITD: 45.4 ng/mL (ref 30.00–100.00)

## 2023-10-31 NOTE — Patient Instructions (Addendum)
 Follow up in 6 months to recheck blood pressure We'll notify you of your lab results and make any changes if needed Continue to work on healthy diet and regular exercise- you can do it! We'll call you to schedule your lung cancer screening Call with any questions or concerns Stay Safe!  Stay Healthy! Happy Spring!!!

## 2023-10-31 NOTE — Assessment & Plan Note (Signed)
 Pt's PE WNL w/ exception of BMI.  UTD on pap, colonoscopy, Tdap.  Plans to schedule mammo.  Check labs.  Anticipatory guidance provided.

## 2023-10-31 NOTE — Progress Notes (Signed)
   Subjective:    Patient ID: Emma Wagner, female    DOB: [birth year], 53 y.o.   MRN: [id number]  HPI CPE- UTD on pap, colonoscopy, Tdap.  Needs to schedule mammo  Patient Care Team    Relationship Specialty Notifications Start End  Sheliah Hatch, MD PCP - General Family Medicine  10/18/22   Key, Verita Schneiders, NP Nurse Practitioner Gynecology  05/11/15   Ranee Gosselin, MD Consulting Physician Orthopedic Surgery  05/12/15   Milagros Evener, MD Consulting Physician Psychiatry  05/12/15   Drema Dallas, DO Consulting Physician Neurology  03/13/19      Health Maintenance  Topic Date Due   HIV Screening  Never done   Hepatitis C Screening  Never done   Zoster Vaccines- Shingrix (1 of 2) Never done   COVID-19 Vaccine (4 - 2024-25 season) 04/21/2023   MAMMOGRAM  06/27/2023   Lung Cancer Screening  10/31/2023   Cervical Cancer Screening (HPV/Pap Cotest)  06/25/2025   DTaP/Tdap/Td (2 - Td or Tdap) 05/30/2026   Colonoscopy  10/17/2032   INFLUENZA VACCINE  Completed   HPV VACCINES  Aged Out      Review of Systems Patient reports no vision/ hearing changes, adenopathy,fever, weight change,  persistant/recurrent hoarseness , swallowing issues, chest pain, palpitations, edema, persistant/recurrent cough, hemoptysis, dyspnea (rest/exertional/paroxysmal nocturnal), gastrointestinal bleeding (melena, rectal bleeding), abdominal pain, significant heartburn, bowel changes, GU symptoms (dysuria, hematuria, incontinence), Gyn symptoms (abnormal  bleeding, pain),  syncope, focal weakness, memory loss, numbness & tingling, skin/hair/nail changes, abnormal bruising or bleeding, anxiety, or depression.     Objective:   Physical Exam General Appearance:    Alert, cooperative, no distress, appears stated age  Head:    Normocephalic, without obvious abnormality, atraumatic  Eyes:    PERRL, conjunctiva/corneas clear, EOM's intact both eyes  Ears:    Normal TM's and external ear canals, both ears  Nose:    Nares normal, septum midline, mucosa normal, no drainage    or sinus tenderness  Throat:   Lips, mucosa, and tongue normal; teeth and gums normal  Neck:   Supple, symmetrical, trachea midline, no adenopathy;    Thyroid: no enlargement/tenderness/nodules  Back:     Symmetric, no curvature, ROM normal, no CVA tenderness  Lungs:     Clear to auscultation bilaterally, respirations unlabored  Chest Wall:    No tenderness or deformity   Heart:    Regular rate and rhythm, S1 and S2 normal, no murmur, rub   or gallop  Breast Exam:    Deferred to GYN  Abdomen:     Soft, non-tender, bowel sounds active all four quadrants,    no masses, no organomegaly  Genitalia:    Deferred to GYN  Rectal:    Extremities:   Extremities normal, atraumatic, no cyanosis or edema  Pulses:   2+ and symmetric all extremities  Skin:   Skin color, texture, turgor normal, no rashes or lesions  Lymph nodes:   Cervical, supraclavicular, and axillary nodes normal  Neurologic:   CNII-XII intact, normal strength, sensation and reflexes    throughout          Assessment & Plan:

## 2023-10-31 NOTE — Assessment & Plan Note (Signed)
Lung cancer screening ordered.

## 2023-11-01 LAB — HEPATITIS C ANTIBODY: Hepatitis C Ab: NONREACTIVE

## 2023-11-01 LAB — HIV ANTIBODY (ROUTINE TESTING W REFLEX): HIV 1&2 Ab, 4th Generation: NONREACTIVE

## 2023-11-01 NOTE — Telephone Encounter (Signed)
-----   Message from Neena Rhymes sent at 10/31/2023  6:41 PM EDT ----- Labs look great w/ exception of mildly elevated ALT (a liver enzyme)  Please hold tylenol and any alcohol x2 weeks and we'll repeat your liver functions at a lab only visit to ensure this is back in normal range (LFTs, dx elevated ALT)

## 2023-11-01 NOTE — Progress Notes (Signed)
 Patient didn't have questions about anything, I ordered the lab and scheduled a lab visit.

## 2023-11-12 ENCOUNTER — Telehealth: Payer: Self-pay | Admitting: Acute Care

## 2023-11-12 ENCOUNTER — Other Ambulatory Visit: Payer: Self-pay

## 2023-11-12 DIAGNOSIS — Z87891 Personal history of nicotine dependence: Secondary | ICD-10-CM

## 2023-11-12 DIAGNOSIS — Z122 Encounter for screening for malignant neoplasm of respiratory organs: Secondary | ICD-10-CM

## 2023-11-12 NOTE — Telephone Encounter (Signed)
 Lung Cancer Screening Narrative/Criteria Questionnaire (Cigarette Smokers Only- No Cigars/Pipes/vapes)   Emma Wagner   SDMV:12/10/23 at 230p/Natalie                                           Jan 06, 1971              LDCT: 12/11/23 at 0940am [address]    53 y.o.   Phone: [phone]  Lung Screening Narrative (confirm age 32-77 yrs Medicare / 50-80 yrs Private pay insurance)   Insurance information:Aetna   Referring Provider:Tabori   This screening involves an initial phone call with a team member from our program. It is called a shared decision making visit. The initial meeting is required by insurance and Medicare to make sure you understand the program. This appointment takes about 15-20 minutes to complete. The CT scan will completed at a separate date/time. This scan takes about 5-10 minutes to complete and you may eat and drink before and after the scan.  Criteria questions for Lung Cancer Screening:   Are you a current or former smoker? Current Age began smoking: 53 yo   If you are a former smoker, what year did you quit smoking? Jan 2025 but quit for 16 years previous   To calculate your smoking history, I need an accurate estimate of how many packs of cigarettes you smoked per day and for how many years. (Not just the number of PPD you are now smoking)   Years smoking 22 x Packs per day 1 = Pack years 22   (at least 20 pack yrs)   (Make sure they understand that we need to know how much they have smoked in the past, not just the number of PPD they are smoking now)  Do you have a personal history of cancer?  No    Do you have a family history of cancer? Yes  (cancer type and and relative) grandparents maybe but not sure type cancers  Are you coughing up blood?  No  Have you had unexplained weight loss of 15 lbs or more in the last 6 months? No  It looks like you meet all criteria.     Additional information: N/A

## 2023-11-15 ENCOUNTER — Other Ambulatory Visit (INDEPENDENT_AMBULATORY_CARE_PROVIDER_SITE_OTHER)

## 2023-11-15 DIAGNOSIS — R7401 Elevation of levels of liver transaminase levels: Secondary | ICD-10-CM | POA: Diagnosis not present

## 2023-11-15 LAB — HEPATIC FUNCTION PANEL
ALT: 41 U/L — ABNORMAL HIGH (ref 0–35)
AST: 30 U/L (ref 0–37)
Albumin: 4.3 g/dL (ref 3.5–5.2)
Alkaline Phosphatase: 59 U/L (ref 39–117)
Bilirubin, Direct: 0.1 mg/dL (ref 0.0–0.3)
Total Bilirubin: 0.3 mg/dL (ref 0.2–1.2)
Total Protein: 7.1 g/dL (ref 6.0–8.3)

## 2023-11-18 ENCOUNTER — Encounter: Payer: Self-pay | Admitting: Family Medicine

## 2023-11-18 DIAGNOSIS — R748 Abnormal levels of other serum enzymes: Secondary | ICD-10-CM

## 2023-11-18 NOTE — Telephone Encounter (Signed)
 Lab results have been discussed.   Verbalized understanding? Yes  Are there any questions?  What can she keep doing to help keep these levels down, is there any reason to repeat testing just to keep an eye on the levels?

## 2023-11-18 NOTE — Telephone Encounter (Signed)
-----   Message from Neena Rhymes sent at 11/18/2023  7:37 AM EDT ----- Liver functions are much closer to normal- this is great news!

## 2023-11-19 NOTE — Telephone Encounter (Signed)
 Called left vm to call and make lab only appt.

## 2023-11-20 ENCOUNTER — Other Ambulatory Visit: Payer: Self-pay | Admitting: Neurology

## 2023-11-22 NOTE — Telephone Encounter (Signed)
 LMOVm for patient, PA just approved for 30 day supply, Has patient call insurance to see of they will allow a 90 day supply?

## 2023-11-25 NOTE — Telephone Encounter (Signed)
 Patient is scheduled for 12/23/2023 at 9:45 am for repeat lab.

## 2023-11-25 NOTE — Telephone Encounter (Signed)
Future lab ordered

## 2023-11-28 ENCOUNTER — Other Ambulatory Visit: Payer: Self-pay | Admitting: Family Medicine

## 2023-12-10 ENCOUNTER — Ambulatory Visit (INDEPENDENT_AMBULATORY_CARE_PROVIDER_SITE_OTHER): Admitting: Acute Care

## 2023-12-10 DIAGNOSIS — Z87891 Personal history of nicotine dependence: Secondary | ICD-10-CM

## 2023-12-10 NOTE — Patient Instructions (Signed)

## 2023-12-10 NOTE — Progress Notes (Addendum)
 Virtual Visit via Telephone Note  I connected with [name] on 12/10/23 at  2:30 PM EDT by telephone and verified that I am speaking with the correct person using two identifiers.  Location: Patient: Emma Wagner Provider: Alyse Bach, RN   I discussed the limitations, risks, security and privacy concerns of performing an evaluation and management service by telephone and the availability of in person appointments. I also discussed with the patient that there may be a patient responsible charge related to this service. The patient expressed understanding and agreed to proceed.    Shared Decision Making Visit Lung Cancer Screening Program [id number])   Eligibility: Age 53 y.o. Pack Years Smoking History Calculation 22 (# packs/per year x # years smoked) Recent History of coughing up blood  no Unexplained weight loss? no ( >Than 15 pounds within the last 6 months ) Prior History Lung / other cancer no (Diagnosis within the last 5 years already requiring surveillance chest CT Scans). Smoking Status Former Smoker Former Smokers: Years since quit: < 1 year  Quit Date: 08/2023  Visit Components: Discussion included one or more decision making aids. yes Discussion included risk/benefits of screening. yes Discussion included potential follow up diagnostic testing for abnormal scans. yes Discussion included meaning and risk of over diagnosis. yes Discussion included meaning and risk of False Positives. yes Discussion included meaning of total radiation exposure. yes  Counseling Included: Importance of adherence to annual lung cancer LDCT screening. yes Impact of comorbidities on ability to participate in the program. yes Ability and willingness to under diagnostic treatment. yes  Smoking Cessation Counseling: Current Smokers:  Discussed importance of smoking cessation. yes Information about tobacco cessation classes and interventions provided to patient. yes Patient  provided with "ticket" for LDCT Scan. no Symptomatic Patient. no  Counseling(Intermediate counseling: > three minutes) 99406 Diagnosis Code: Tobacco Use Z72.0 Asymptomatic Patient yes  Counseling (Intermediate counseling: > three minutes counseling) K4401 Former Smokers:  Discussed the importance of maintaining cigarette abstinence. yes Diagnosis Code: Personal History of Nicotine Dependence. U27.253 Information about tobacco cessation classes and interventions provided to patient. Yes Patient provided with "ticket" for LDCT Scan. no Written Order for Lung Cancer Screening with LDCT placed in Epic. Yes (CT Chest Lung Cancer Screening Low Dose W/O CM) [id number] Z12.2-Screening of respiratory organs Z87.891-Personal history of nicotine dependence   Alyse Bach, Film/video editor I agree with the documentation of the Shared Decision Making visit,  smoking cessation counseling if appropriate, and verification or eligibility for lung cancer screening as documented by the RN Nurse Navigator.   Raejean Bullock, MSN, AGACNP-BC [facility] See Amion for personal pager PCCM on call pager [phone]

## 2023-12-11 ENCOUNTER — Ambulatory Visit
Admission: RE | Admit: 2023-12-11 | Discharge: 2023-12-11 | Disposition: A | Discharge status: Home / Self Care | Source: Ambulatory Visit | Attending: Family Medicine | Admitting: Family Medicine

## 2023-12-11 DIAGNOSIS — Z122 Encounter for screening for malignant neoplasm of respiratory organs: Secondary | ICD-10-CM

## 2023-12-11 DIAGNOSIS — Z87891 Personal history of nicotine dependence: Secondary | ICD-10-CM

## 2023-12-20 ENCOUNTER — Encounter: Payer: Self-pay | Admitting: Family Medicine

## 2023-12-23 ENCOUNTER — Other Ambulatory Visit

## 2023-12-24 ENCOUNTER — Other Ambulatory Visit (INDEPENDENT_AMBULATORY_CARE_PROVIDER_SITE_OTHER)

## 2023-12-24 DIAGNOSIS — R748 Abnormal levels of other serum enzymes: Secondary | ICD-10-CM | POA: Diagnosis not present

## 2023-12-24 LAB — HEPATIC FUNCTION PANEL
ALT: 50 U/L — ABNORMAL HIGH (ref 0–35)
AST: 29 U/L (ref 0–37)
Albumin: 3.9 g/dL (ref 3.5–5.2)
Alkaline Phosphatase: 51 U/L (ref 39–117)
Bilirubin, Direct: 0.1 mg/dL (ref 0.0–0.3)
Total Bilirubin: 0.3 mg/dL (ref 0.2–1.2)
Total Protein: 6.6 g/dL (ref 6.0–8.3)

## 2023-12-25 ENCOUNTER — Other Ambulatory Visit: Payer: Self-pay

## 2023-12-25 ENCOUNTER — Encounter: Payer: Self-pay | Admitting: Family Medicine

## 2023-12-25 ENCOUNTER — Telehealth: Payer: Self-pay

## 2023-12-25 DIAGNOSIS — Z87891 Personal history of nicotine dependence: Secondary | ICD-10-CM

## 2023-12-25 DIAGNOSIS — Z122 Encounter for screening for malignant neoplasm of respiratory organs: Secondary | ICD-10-CM

## 2023-12-25 NOTE — Telephone Encounter (Signed)
 Pt has reviewed labs via MyChart

## 2023-12-25 NOTE — Telephone Encounter (Signed)
-----   Message from Laymon Priest sent at 12/25/2023  7:25 AM EDT ----- Liver functions are again slightly elevated but overall stable.  We will continue to monitor this going forward.  Your lung cancer screening CT showed that the portions of your liver that were imaged were normal.  This is great news!

## 2023-12-26 DIAGNOSIS — F4312 Post-traumatic stress disorder, chronic: Secondary | ICD-10-CM | POA: Diagnosis not present

## 2023-12-26 DIAGNOSIS — F411 Generalized anxiety disorder: Secondary | ICD-10-CM | POA: Diagnosis not present

## 2024-01-15 ENCOUNTER — Other Ambulatory Visit: Payer: Self-pay | Admitting: Family Medicine

## 2024-01-30 DIAGNOSIS — F411 Generalized anxiety disorder: Secondary | ICD-10-CM | POA: Diagnosis not present

## 2024-01-30 DIAGNOSIS — F4312 Post-traumatic stress disorder, chronic: Secondary | ICD-10-CM | POA: Diagnosis not present

## 2024-02-04 ENCOUNTER — Other Ambulatory Visit (HOSPITAL_COMMUNITY): Payer: Self-pay

## 2024-02-06 DIAGNOSIS — F4312 Post-traumatic stress disorder, chronic: Secondary | ICD-10-CM | POA: Diagnosis not present

## 2024-02-06 DIAGNOSIS — F411 Generalized anxiety disorder: Secondary | ICD-10-CM | POA: Diagnosis not present

## 2024-02-13 DIAGNOSIS — F411 Generalized anxiety disorder: Secondary | ICD-10-CM | POA: Diagnosis not present

## 2024-02-13 DIAGNOSIS — F4312 Post-traumatic stress disorder, chronic: Secondary | ICD-10-CM | POA: Diagnosis not present

## 2024-02-27 DIAGNOSIS — F411 Generalized anxiety disorder: Secondary | ICD-10-CM | POA: Diagnosis not present

## 2024-02-27 DIAGNOSIS — F4312 Post-traumatic stress disorder, chronic: Secondary | ICD-10-CM | POA: Diagnosis not present

## 2024-03-05 DIAGNOSIS — F411 Generalized anxiety disorder: Secondary | ICD-10-CM | POA: Diagnosis not present

## 2024-03-05 DIAGNOSIS — F4312 Post-traumatic stress disorder, chronic: Secondary | ICD-10-CM | POA: Diagnosis not present

## 2024-03-06 ENCOUNTER — Encounter: Payer: Self-pay | Admitting: Advanced Practice Midwife

## 2024-03-12 DIAGNOSIS — F411 Generalized anxiety disorder: Secondary | ICD-10-CM | POA: Diagnosis not present

## 2024-03-12 DIAGNOSIS — F4312 Post-traumatic stress disorder, chronic: Secondary | ICD-10-CM | POA: Diagnosis not present

## 2024-03-19 DIAGNOSIS — F4312 Post-traumatic stress disorder, chronic: Secondary | ICD-10-CM | POA: Diagnosis not present

## 2024-03-19 DIAGNOSIS — F411 Generalized anxiety disorder: Secondary | ICD-10-CM | POA: Diagnosis not present

## 2024-03-26 DIAGNOSIS — F411 Generalized anxiety disorder: Secondary | ICD-10-CM | POA: Diagnosis not present

## 2024-03-26 DIAGNOSIS — F4312 Post-traumatic stress disorder, chronic: Secondary | ICD-10-CM | POA: Diagnosis not present

## 2024-04-01 DIAGNOSIS — F411 Generalized anxiety disorder: Secondary | ICD-10-CM | POA: Diagnosis not present

## 2024-04-01 DIAGNOSIS — F3342 Major depressive disorder, recurrent, in full remission: Secondary | ICD-10-CM | POA: Diagnosis not present

## 2024-04-01 DIAGNOSIS — F4310 Post-traumatic stress disorder, unspecified: Secondary | ICD-10-CM | POA: Diagnosis not present

## 2024-04-02 DIAGNOSIS — F4312 Post-traumatic stress disorder, chronic: Secondary | ICD-10-CM | POA: Diagnosis not present

## 2024-04-02 DIAGNOSIS — Z1231 Encounter for screening mammogram for malignant neoplasm of breast: Secondary | ICD-10-CM | POA: Diagnosis not present

## 2024-04-02 DIAGNOSIS — F411 Generalized anxiety disorder: Secondary | ICD-10-CM | POA: Diagnosis not present

## 2024-04-09 DIAGNOSIS — F411 Generalized anxiety disorder: Secondary | ICD-10-CM | POA: Diagnosis not present

## 2024-04-09 DIAGNOSIS — F4312 Post-traumatic stress disorder, chronic: Secondary | ICD-10-CM | POA: Diagnosis not present

## 2024-04-16 DIAGNOSIS — F411 Generalized anxiety disorder: Secondary | ICD-10-CM | POA: Diagnosis not present

## 2024-04-16 DIAGNOSIS — F4312 Post-traumatic stress disorder, chronic: Secondary | ICD-10-CM | POA: Diagnosis not present

## 2024-04-23 DIAGNOSIS — F411 Generalized anxiety disorder: Secondary | ICD-10-CM | POA: Diagnosis not present

## 2024-04-23 DIAGNOSIS — F4312 Post-traumatic stress disorder, chronic: Secondary | ICD-10-CM | POA: Diagnosis not present

## 2024-05-14 DIAGNOSIS — F4312 Post-traumatic stress disorder, chronic: Secondary | ICD-10-CM | POA: Diagnosis not present

## 2024-05-14 DIAGNOSIS — F411 Generalized anxiety disorder: Secondary | ICD-10-CM | POA: Diagnosis not present

## 2024-05-22 ENCOUNTER — Encounter: Payer: Self-pay | Admitting: Family Medicine

## 2024-05-22 ENCOUNTER — Ambulatory Visit (INDEPENDENT_AMBULATORY_CARE_PROVIDER_SITE_OTHER): Admitting: Family Medicine

## 2024-05-22 VITALS — BP 120/62 | HR 73 | Temp 98.7°F | Wt 178.2 lb

## 2024-05-22 DIAGNOSIS — I10 Essential (primary) hypertension: Secondary | ICD-10-CM | POA: Diagnosis not present

## 2024-05-22 DIAGNOSIS — E663 Overweight: Secondary | ICD-10-CM

## 2024-05-22 DIAGNOSIS — Z720 Tobacco use: Secondary | ICD-10-CM | POA: Diagnosis not present

## 2024-05-22 DIAGNOSIS — G4700 Insomnia, unspecified: Secondary | ICD-10-CM | POA: Diagnosis not present

## 2024-05-22 DIAGNOSIS — Z23 Encounter for immunization: Secondary | ICD-10-CM

## 2024-05-22 LAB — HEPATIC FUNCTION PANEL
ALT: 33 U/L (ref 0–35)
AST: 26 U/L (ref 0–37)
Albumin: 4.1 g/dL (ref 3.5–5.2)
Alkaline Phosphatase: 55 U/L (ref 39–117)
Bilirubin, Direct: 0.1 mg/dL (ref 0.0–0.3)
Total Bilirubin: 0.4 mg/dL (ref 0.2–1.2)
Total Protein: 7 g/dL (ref 6.0–8.3)

## 2024-05-22 LAB — BASIC METABOLIC PANEL WITH GFR
BUN: 13 mg/dL (ref 6–23)
CO2: 31 meq/L (ref 19–32)
Calcium: 9.5 mg/dL (ref 8.4–10.5)
Chloride: 105 meq/L (ref 96–112)
Creatinine, Ser: 0.82 mg/dL (ref 0.40–1.20)
GFR: 81.65 mL/min (ref >60.00)
Glucose, Bld: 82 mg/dL (ref 70–99)
Potassium: 3.7 meq/L (ref 3.5–5.1)
Sodium: 142 meq/L (ref 135–145)

## 2024-05-22 LAB — LIPID PANEL
Cholesterol: 152 mg/dL (ref 0–200)
HDL: 57.5 mg/dL (ref >39.00)
LDL Cholesterol: 72 mg/dL (ref 0–99)
NonHDL: 94.73
Total CHOL/HDL Ratio: 3
Triglycerides: 115 mg/dL (ref 0.0–149.0)
VLDL: 23 mg/dL (ref 0.0–40.0)

## 2024-05-22 LAB — CBC WITH DIFFERENTIAL/PLATELET
Basophils Absolute: 0 K/uL (ref 0.0–0.1)
Basophils Relative: 0.8 % (ref 0.0–3.0)
Eosinophils Absolute: 0.1 K/uL (ref 0.0–0.7)
Eosinophils Relative: 1.8 % (ref 0.0–5.0)
HCT: 39.9 % (ref 36.0–46.0)
Hemoglobin: 13.2 g/dL (ref 12.0–15.0)
Lymphocytes Relative: 44.2 % (ref 12.0–46.0)
Lymphs Abs: 2.4 K/uL (ref 0.7–4.0)
MCHC: 33.1 g/dL (ref 30.0–36.0)
MCV: 94.8 fl (ref 78.0–100.0)
Monocytes Absolute: 0.3 K/uL (ref 0.1–1.0)
Monocytes Relative: 5.3 % (ref 3.0–12.0)
Neutro Abs: 2.6 K/uL (ref 1.4–7.7)
Neutrophils Relative %: 47.9 % (ref 43.0–77.0)
Platelets: 229 K/uL (ref 150.0–400.0)
RBC: 4.21 Mil/uL (ref 3.87–5.11)
RDW: 12.6 % (ref 11.5–15.5)
WBC: 5.4 K/uL (ref 4.0–10.5)

## 2024-05-22 LAB — TSH: TSH: 0.84 u[IU]/mL (ref 0.35–5.50)

## 2024-05-22 MED ORDER — VARENICLINE TARTRATE 1 MG PO TABS: 1.0000 mg | ORAL_TABLET | Freq: Two times a day (BID) | ORAL | 3 refills | Status: DC

## 2024-05-22 MED ORDER — PHENTERMINE HCL 37.5 MG PO TABS: 37.5000 mg | ORAL_TABLET | Freq: Every day | ORAL | 0 refills | Status: AC

## 2024-05-22 MED ORDER — VALACYCLOVIR HCL 1 G PO TABS: ORAL_TABLET | ORAL | 3 refills | Status: AC

## 2024-05-22 MED ORDER — TEMAZEPAM 30 MG PO CAPS: 30.0000 mg | ORAL_CAPSULE | Freq: Every day | ORAL | 0 refills | Status: AC

## 2024-05-22 NOTE — Assessment & Plan Note (Signed)
 Since her weight loss surgery she has effectively come off BP meds and BP remains well controlled.  Currently asymptomatic.  Will continue to monitor.
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
## 2024-05-22 NOTE — Assessment & Plan Note (Signed)
 Pt reports she is going back and forth with her smoking.  Is not currently smoking but has been using Chantix  to control cravings.  Would like refill sent to pharmacy.

## 2024-05-22 NOTE — Patient Instructions (Signed)
 Schedule your complete physical in 6 months We'll notify you of your lab results and make any changes if needed Continue to work on healthy diet and regular exercise- you can do it! START the Phentermine  once daily to help w/ metabolism and hit your goal weight CONTINUE the Chantix  RESTART the Temazepam  for sleep USE the Valtrex  as needed for cold sores Call with any questions or concerns Stay Safe!  Stay Healthy! Happy Fall!!

## 2024-05-22 NOTE — Assessment & Plan Note (Signed)
 Weight and BMI are stable.  She admits she is not currently exercising.  Stressed the need for continued healthy food choices and regular physical activity.  Will continue to follow.

## 2024-05-25 ENCOUNTER — Ambulatory Visit: Payer: Self-pay | Admitting: Family Medicine

## 2024-05-26 ENCOUNTER — Telehealth: Payer: Self-pay

## 2024-05-26 NOTE — Telephone Encounter (Signed)
 Sent to PCP. Request too soon

## 2024-05-26 NOTE — Telephone Encounter (Signed)
 This was sent on 10/3.  Can we please check w/ pharmacy (but also let pt know it was sent)

## 2024-05-27 ENCOUNTER — Other Ambulatory Visit (HOSPITAL_COMMUNITY): Payer: Self-pay

## 2024-05-27 NOTE — Telephone Encounter (Signed)
Called patient and left vm to return call.

## 2024-05-27 NOTE — Telephone Encounter (Signed)
Patient needs a PA  ?

## 2024-05-27 NOTE — Telephone Encounter (Signed)
PA request has been Submitted.

## 2024-05-28 ENCOUNTER — Telehealth: Payer: Self-pay

## 2024-05-28 NOTE — Telephone Encounter (Signed)
 RESTORIL  was denied. Alternative?

## 2024-05-28 NOTE — Telephone Encounter (Signed)
 Pharmacy Patient Advocate Encounter  Received notification from [facility] that Prior Authorization for Temazepam  30MG  capsules has been DENIED.  Full denial letter will be uploaded to the media tab. See denial reason below.  Requested quantity exceeds limit; plan covers only 15 Restoril  capsules monthly.  PA #/Case ID/Reference #: [id number]

## 2024-05-28 NOTE — Telephone Encounter (Signed)
 Requested quantity exceeds limit; plan covers only 15 Restoril  capsules monthly.

## 2024-05-28 NOTE — Telephone Encounter (Signed)
 See other note in regards to PA. Message sent to Dr. Mahlon

## 2024-05-29 NOTE — Telephone Encounter (Signed)
 Called patient and she is fine with 15 caps/month. I called her pharmacy and gave them the okay. They are working on getting medication filled

## 2024-05-29 NOTE — Telephone Encounter (Signed)
 Please ask pt if she would like to proceed w/ 15 per month or if she would like us  to switch it entirely

## 2024-05-29 NOTE — Telephone Encounter (Signed)
 Pt stated if there is a medication that treats the same as the Restoril  but has a higher quantity coverage and is covered by insurance then she would like to do that but if there is no alternative she will take the Restoril  15 caps monthly

## 2024-05-29 NOTE — Telephone Encounter (Signed)
 Restoril  works differently than the other sleep medicines so if she likes that one specifically, I would do the 15/month and we can re-evaluate in the future.  Please call the pharmacy and tell them to fill the 15 that are approved by insurance

## 2024-06-11 DIAGNOSIS — F4312 Post-traumatic stress disorder, chronic: Secondary | ICD-10-CM | POA: Diagnosis not present

## 2024-06-18 DIAGNOSIS — F4322 Adjustment disorder with anxiety: Secondary | ICD-10-CM | POA: Diagnosis not present

## 2024-06-18 DIAGNOSIS — F4312 Post-traumatic stress disorder, chronic: Secondary | ICD-10-CM | POA: Diagnosis not present

## 2024-06-18 DIAGNOSIS — F411 Generalized anxiety disorder: Secondary | ICD-10-CM | POA: Diagnosis not present

## 2024-06-18 DIAGNOSIS — F4310 Post-traumatic stress disorder, unspecified: Secondary | ICD-10-CM | POA: Diagnosis not present

## 2024-07-07 ENCOUNTER — Telehealth: Payer: Self-pay

## 2024-07-07 NOTE — Telephone Encounter (Signed)
 I'm not seeing the rest of the note.SABRASABRASABRA

## 2024-07-07 NOTE — Telephone Encounter (Signed)
 Sent to PCP ?

## 2024-07-08 ENCOUNTER — Encounter: Payer: Self-pay | Admitting: Family Medicine

## 2024-07-08 NOTE — Telephone Encounter (Signed)
 Called the Dr back and let him know the recommendation and he voiced understanding will start with Tylenol  then move to tramadol  if necessary

## 2024-07-08 NOTE — Telephone Encounter (Signed)
 Communication  Reason for CRM: Dr. Glean saw patient this morning and she had a dental extraction. Dr. Glean needs input from Dr.Tabori on what medication to give patient since she cant take motrin . Dr. Glean wants to know if Tylenol  would be okay to help with pain or if Dr.Tabori recommends something else.        [phone] main office but if it goes to voicemail    [phone] Dr. Dereck cell

## 2024-07-08 NOTE — Telephone Encounter (Signed)
 I'm not sure why she can't take motrin  (unless it's due to her gastric bypass surgery) but Tylenol  should be fine.  If pain is not controlled w/ Tylenol , she has tolerated Tramadol  in the past.

## 2024-07-23 DIAGNOSIS — F4312 Post-traumatic stress disorder, chronic: Secondary | ICD-10-CM | POA: Diagnosis not present

## 2024-07-23 DIAGNOSIS — F411 Generalized anxiety disorder: Secondary | ICD-10-CM | POA: Diagnosis not present

## 2024-07-29 ENCOUNTER — Telehealth: Payer: Self-pay | Admitting: Neurology

## 2024-07-29 NOTE — Telephone Encounter (Signed)
 [name] called in stating that she needs to schedule for a nerve cond test, and was told by Dr. 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   PH: [phone]

## 2024-07-30 DIAGNOSIS — F411 Generalized anxiety disorder: Secondary | ICD-10-CM | POA: Diagnosis not present

## 2024-07-30 DIAGNOSIS — F4312 Post-traumatic stress disorder, chronic: Secondary | ICD-10-CM | POA: Diagnosis not present

## 2024-08-03 ENCOUNTER — Encounter: Payer: Self-pay | Admitting: Neurology

## 2024-08-03 ENCOUNTER — Ambulatory Visit: Admitting: Neurology

## 2024-08-03 VITALS — BP 142/89 | HR 87 | Ht 66.0 in | Wt 182.0 lb

## 2024-08-03 DIAGNOSIS — G43009 Migraine without aura, not intractable, without status migrainosus: Secondary | ICD-10-CM | POA: Diagnosis not present

## 2024-08-03 DIAGNOSIS — G5603 Carpal tunnel syndrome, bilateral upper limbs: Secondary | ICD-10-CM | POA: Diagnosis not present

## 2024-08-03 MED ORDER — ZEMBRACE SYMTOUCH 3 MG/0.5ML ~~LOC~~ SOAJ: 3.0000 mg | SUBCUTANEOUS | 11 refills | Status: AC | PRN

## 2024-08-03 MED ORDER — UBRELVY 100 MG PO TABS: ORAL_TABLET | ORAL | 3 refills | Status: AC

## 2024-08-03 NOTE — Patient Instructions (Signed)
 Ubrelvy  and Zembrace refilled - need PA Will ask about working you in for nerve conduction study Follow up one year

## 2024-08-03 NOTE — Progress Notes (Signed)
 NEUROLOGY FOLLOW UP OFFICE NOTE  Emma Wagner [id number]  Assessment/Plan:   1. Migraine without aura, without status migrainosus, not intractable - improved.  Will hold of on a preventative 2.   Probable bilateral carpal tunnel syndrome   1.  Will get NCV-EMG to verify presence and severity of carpal tunnel syndrome.  Send to hand specialist accordingly.  In meantime, may want to follow up with hand specialist to see if she can get a steroid injection in the meantime.   Migraine prevention:  Not indicated 2.  Migraine rescue:  Zembrace SymTouch , Ubrelvy  100mg  3.  will contact me if she wishes to pursue NCV-EMG 4. Limit use of pain relievers to no more than 9 days out of the month to prevent risk of rebound or medication-overuse headache. 5.  Keep headache diary 6. Follow up 6 months.   Subjective:  Emma Wagner is a 53 year old woman with history of kidney stones who follows up for migraines.   UPDATE: Migraines: Stable.  Occurs 1 to 2 times a month.  Lasts 30 minutes with Ubrelvy .  Rarely needs Zembrace. Major - within 30 minutes with Ubelvy - shot second line every other month.  Once a month to twice a month  Bilateral hand numbness: Her primary concern is her suspected carpal tunnel syndrome.  Her husband became paralyzed in October and she is now his primary caregiver.  All of the heavy lifting and use of her hands has aggravated the pain and numbness in her hands.  Right thumb is always numb and right wrist hurts.  Fingers in left hand are numb.  Drops objects  Difficulty opening jars.  Wears wrist splints without improvement.    Current medications: Current NSAIDS:  ibuprofen  800mg , tramadol  50mg  (pain) Current analgesics:  Tylenol  PM Current triptans:  Zembrace SymTouch  Current ergotamine:  none Current anti-emetic:  Zofran -ODT 4mg  Current muscle relaxants:  Robaxin  Current anti-anxiolytic:  none Current sleep aide:  Tylenol  PM Current Antihypertensive medications:   none Current Antidepressant medications:  Wellbutrin , Prozac  40mg  Current Anticonvulsant medications:  none Current anti-CGRP:  Ubrelvy  100mg  Current Vitamins/Herbal/Supplements:  B12 Current Antihistamines/Decongestants:  Tylenol  PM Other therapy:  Meditation, Daith piercing in both ears (helped) Hormone/birth control:  none Other medications:  Adderall   Caffeine :  1 cup of coffee daily Diet:  Improved diet to lose weight.  Increased water  intake.  No soda. Exercise:  yes Depression:  controlled; Anxiety:  controlled Other pain:  History of neck and back pain. Sleep hygiene:  Diagnosed with OSA.  On CPAP. SABRA    HISTORY:  Onset:.  Increased frequency over the years. She wakes up with some sort of headache daily.   Location:  Either left or right sided (including periorbital area) Quality:  Sometimes throbbing and sometimes non-throbbing Initial intensity:  8/10.  She denies new headache, thunderclap headache or severe headache that wakes her from sleep. Aura:  no Premonitory Phase:  no Postdrome:  Fatigued, exhausted for a day Associated symptoms:  Nausea, vomiting, photophobia, phonophobia, osmophobia, .  She denies associated visual disturbance, autonomic symptoms, or unilateral numbness or weakness. Initial Duration:  2 to 3 days Initial Frequency:  Daily headaches; 2 to 3 days of migraine a week Triggers:  Unknown Relieving factors:  Sleep in dark and quiet room, meditation Activity:  Aggravates She wakes up every morning with some type of diffuse dull headache.  She treats these headaches every morning with either rizatriptan , Fioricet (for about a year)   Past  NSAIDS:  ASA, Celebrex , ibuprofen  Past analgesics:  Tylenol , Excedrin Migraine, Fioricet Past abortive triptans:  Rizatriptan  10mg , Sumatriptan  50mg  (previously effective), Tosymra  NS Past abortive ergotamine:  none Past muscle relaxants:  Flexeril , Robaxin  Past anti-emetic:  none Past antihypertensive medications:   None.  However, she reports history of dizziness. 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  PAST MEDICAL HISTORY: Past Medical History:  Diagnosis Date   Anemia    Anxiety    add also   Arthritis    Blood transfusion without reported diagnosis    Borderline type 2 diabetes mellitus since 2013   -lost weight and quit smoking and is no longer PRE diabetic   Bursitis    bilateral hips, more on right hip   Constipation    Depression    Frequent urination    while kidney stone evident   GERD (gastroesophageal reflux disease)    Headache    every blue moon. not often   Hemorrhoids    History of kidney stones    Hypertension    Obesity    Pneumonia    PONV (postoperative nausea and vomiting)    nausea with appendectomy and with hip replacement in March 2017   Pre-diabetes    Sleep apnea    uses CPAP    MEDICATIONS: Current Outpatient Medications on File Prior to Visit  Medication Sig Dispense Refill   buPROPion  (WELLBUTRIN  XL) 150 MG 24 hr tablet Take 150 mg by mouth every morning.     cyclobenzaprine  (FLEXERIL ) 5 MG tablet Take 5-10 mg by mouth at bedtime as needed.     diphenhydrAMINE  HCl, Sleep, (ZZZQUIL) 50 MG/30ML LIQD Take 30 mLs by mouth at bedtime as needed (Sleep).     diphenhydramine -acetaminophen  (TYLENOL  PM) 25-500 MG TABS tablet Take 2 tablets by mouth at bedtime as needed (sleep).     famotidine  (PEPCID ) 40 MG tablet TAKE 1 TABLET BY MOUTH EVERYDAY AT  BEDTIME 30 tablet 5   norethindrone  (AYGESTIN ) 5 MG tablet Take 5 mg by mouth daily. To stop periods x 1 month in order to have gastric surgery     ondansetron  (ZOFRAN ) 4 MG tablet Take 1 tablet (4 mg total) by mouth every 8 (eight) hours as needed for nausea or vomiting. 30 tablet 0   ondansetron  (ZOFRAN -ODT) 4 MG disintegrating tablet Take 1 tablet (4 mg total) by mouth every 6 (six) hours as needed for nausea or vomiting. 20 tablet 0   pantoprazole  (PROTONIX ) 40 MG tablet Take 1 tablet (40 mg total) by mouth daily. 90 tablet 0   phentermine  (ADIPEX-P ) 37.5 MG tablet Take 1 tablet (37.5 mg total) by mouth daily before breakfast. 90 tablet 0   temazepam  (RESTORIL ) 30 MG capsule Take 1 capsule (30 mg total) by mouth at bedtime. 90 capsule 0   traMADol  (ULTRAM ) 50 MG tablet Take 50 mg by mouth every 8 (eight) hours as needed.     Ubrogepant  (UBRELVY ) 100 MG TABS TAKE 1 TABLET BY MOUTH AS NEEDED MAY REPEAT IN 2 HOURS IF NEEDED MAX 2 TABLETS IN 24 HOURS 48 tablet 0  valACYclovir  (VALTREX ) 1000 MG tablet TAKE 1 TAB EVERY 12 HOURS FOR 2 DOSES AT ONSET OF COLD SORE. REPEAT AS NEEDED 20 tablet 3   varenicline  (CHANTIX ) 1 MG tablet Take 1 tablet (1 mg total) by mouth 2 (two) times daily. 60 tablet 3   Vilazodone  HCl (VIIBRYD ) 40 MG TABS Take 40 mg by mouth daily.     No current facility-administered medications on file prior to visit.    ALLERGIES: No Known Allergies  FAMILY HISTORY: Family History  Problem Relation Age of Onset   Arthritis Mother    Hypertension Mother    Sleep apnea Mother    Prostate cancer Father    Skin cancer Father    Congestive Heart Failure Father    Hypertension Father    Snoring Father    Heart failure Other    Hypertension Other    Cancer Other    Anxiety disorder Other    Depression Other    Insomnia Other    Stomach cancer Neg Hx    Colon cancer Neg Hx    Esophageal cancer Neg Hx       Objective:  Blood pressure (!) 142/89, pulse 87, height 5' 6  (1.676 m), weight 182 lb (82.6 kg), SpO2 99%. General: No acute distress.  Patient appears well-groomed.   Head:  Normocephalic/atraumatic Neck:  Supple.  No paraspinal tenderness.  Full range of motion. Heart:  Regular rate and rhythm. Neuro:  Alert and oriented.  Speech fluent and not dysarthric.  Language intact.  CN II-XII intact.  Bulk and tone normal.  Muscle strength 5/5 throughout. Decreased light touch sensation to right thumb and first 4 digits of left hand. Positive Tinel's sign at wrist bilaterally (right worse than left).  Deep tendon reflexes 2+ throughout, toes downgoing.  Gait normal.  Romberg negative.    Juliene Dunnings, DO  CC: Comer Greet, MD

## 2024-08-04 ENCOUNTER — Other Ambulatory Visit: Payer: Self-pay

## 2024-08-04 DIAGNOSIS — G5603 Carpal tunnel syndrome, bilateral upper limbs: Secondary | ICD-10-CM

## 2024-08-06 DIAGNOSIS — F411 Generalized anxiety disorder: Secondary | ICD-10-CM | POA: Diagnosis not present

## 2024-08-06 DIAGNOSIS — F4312 Post-traumatic stress disorder, chronic: Secondary | ICD-10-CM | POA: Diagnosis not present

## 2024-08-11 ENCOUNTER — Ambulatory Visit: Payer: Self-pay | Admitting: Neurology

## 2024-08-11 DIAGNOSIS — G5603 Carpal tunnel syndrome, bilateral upper limbs: Secondary | ICD-10-CM

## 2024-08-11 NOTE — Procedures (Signed)
 " [facility]  [address] [place], [address]  [place], [place] [address] Tel: [phone] Fax: [phone] Test Date:  08/11/2024  Patient: Emma Wagner DOB: 1970/11/08 Physician: Venetia Potters, MD  Sex: Female Height: 5' 6 Ref Phys: Juliene Dunnings, DO  ID#: [id number] Temp:  34.2C Technician:    History: This is a 53 year old female with pain in bilateral hands.  NCV & EMG Findings: Extensive electrodiagnostic evaluation of bilateral upper limbs shows: Bilateral median sensory responses show prolonged distal peak latency (L5.1, R4.8 ms) and reduced amplitude (L6, R11 V). Bilateral ulnar and radial sensory responses are within normal limits. Right median (APB) motor response shows prolonged distal onset latency (5.3 ms) and reduced amplitude (4.0 mV). Left median (APB) motor response shows prolonged distal onset latency (4.4 ms). Bilateral ulnar (ADM) motor responses are within normal limits. There is no evidence of active or chronic motor axon loss changes affecting any of the tested muscles on needle examination. Motor unit configuration and recruitment pattern is within normal limits.  Impression: This is an abnormal study. The findings are most consistent with the following: Bilateral median mononeuropathy at or distal to the wrist, consistent with carpal tunnel syndrome, moderate in degree electrically, right side worse than left side. No electrodiagnostic evidence of right or left cervical (C5-C8) motor radiculopathy. Screening studies for right or left ulnar or radial mononeuropathies are normal.    ___________________________ Venetia Potters, MD    Nerve Conduction Studies Motor Nerve Results    Latency Amplitude F-Lat Segment Distance CV Comment  Site (ms) Norm (mV) Norm (ms)  (cm) (m/s) Norm   Left Median (APB) Motor  Wrist *4.4  < 4.0 6.2  > 6.0        Elbow 9.2 - 4.9 -  Elbow-Wrist 26 54  > 50   Right Median (APB) Motor  Wrist *5.3  < 4.0 *4.0  > 6.0         Elbow 9.7 - 3.5 -  Elbow-Wrist 25 57  > 50   Left Ulnar (ADM) Motor  Wrist 2.2  < 3.1 8.6  > 7.0        Bel elbow 5.5 - 8.0 -  Bel elbow-Wrist 19.5 59  > 50   Ab elbow 7.3 - 7.0 -  Ab elbow-Bel elbow 10 56 -   Right Ulnar (ADM) Motor  Wrist 2.0  < 3.1 9.5  > 7.0        Bel elbow 5.3 - 7.5 -  Bel elbow-Wrist 20.5 62  > 50   Ab elbow 7.1 - 7.0 -  Ab elbow-Bel elbow 10 56 -    Sensory Sites    Neg Peak Lat Amplitude (O-P) Segment Distance Velocity Comment  Site (ms) Norm (V) Norm  (cm) (ms)   Left Median Sensory  Wrist-Dig II *5.1  < 3.6 *6  > 15 Wrist-Dig II 13    Right Median Sensory  Wrist-Dig II *4.8  < 3.6 *11  > 15 Wrist-Dig II 13    Left Radial Sensory  Forearm-Wrist 1.90  < 2.7 29  > 14 Forearm-Wrist 10    Right Radial Sensory  Forearm-Wrist 1.95  < 2.7 54  > 14 Forearm-Wrist 10    Left Ulnar Sensory  Wrist-Dig V 2.7  < 3.1 18  > 10 Wrist-Dig V 11    Right Ulnar Sensory  Wrist-Dig V 2.6  < 3.1 23  > 10 Wrist-Dig V 11     Electromyography  Side Muscle Ins.Act Fibs Fasc Recrt Amp Dur Poly Activation Comment  Right FDI Nml Nml Nml Nml Nml Nml Nml Nml N/A  Right Pronator teres Nml Nml Nml Nml Nml Nml Nml Nml N/A  Right Biceps Nml Nml Nml Nml Nml Nml Nml Nml N/A  Right Triceps Nml Nml Nml Nml Nml Nml Nml Nml N/A  Right Deltoid Nml Nml Nml Nml Nml Nml Nml Nml N/A  Left FDI Nml Nml Nml Nml Nml Nml Nml Nml N/A  Left Pronator teres Nml Nml Nml Nml Nml Nml Nml Nml N/A  Left Biceps Nml Nml Nml Nml Nml Nml Nml Nml N/A  Left Triceps Nml Nml Nml Nml Nml Nml Nml Nml N/A  Left Deltoid Nml Nml Nml Nml Nml Nml Nml Nml N/A      Waveforms:  Motor           Sensory               "

## 2024-08-12 NOTE — Progress Notes (Signed)
 Patient advised. Report sent Via Epic to [facility] Dr. Sebastian

## 2024-08-27 ENCOUNTER — Ambulatory Visit: Admitting: Neurology

## 2024-09-15 ENCOUNTER — Other Ambulatory Visit: Payer: Self-pay | Admitting: Family Medicine

## 2024-09-25 ENCOUNTER — Encounter (HOSPITAL_COMMUNITY): Payer: Self-pay | Admitting: *Deleted

## 2024-11-27 ENCOUNTER — Encounter: Admitting: Family Medicine
